# Patient Record
Sex: Female | Born: 1935 | Race: White | Hispanic: No | Smoking: Never smoker
Health system: Southern US, Community
[De-identification: ages and names within clinical notes are randomized; demographics above are authoritative.]

## PROBLEM LIST (undated history)

## (undated) DIAGNOSIS — Z79899 Other long term (current) drug therapy: Secondary | ICD-10-CM

## (undated) DIAGNOSIS — M79609 Pain in unspecified limb: Secondary | ICD-10-CM

## (undated) DIAGNOSIS — E785 Hyperlipidemia, unspecified: Secondary | ICD-10-CM

## (undated) DIAGNOSIS — K219 Gastro-esophageal reflux disease without esophagitis: Secondary | ICD-10-CM

## (undated) DIAGNOSIS — I5189 Other ill-defined heart diseases: Secondary | ICD-10-CM

## (undated) DIAGNOSIS — F0390 Unspecified dementia without behavioral disturbance: Secondary | ICD-10-CM

## (undated) DIAGNOSIS — E038 Other specified hypothyroidism: Secondary | ICD-10-CM

## (undated) DIAGNOSIS — Z7901 Long term (current) use of anticoagulants: Secondary | ICD-10-CM

## (undated) DIAGNOSIS — Z6828 Body mass index (BMI) 28.0-28.9, adult: Secondary | ICD-10-CM

## (undated) DIAGNOSIS — R413 Other amnesia: Secondary | ICD-10-CM

## (undated) DIAGNOSIS — I495 Sick sinus syndrome: Secondary | ICD-10-CM

## (undated) DIAGNOSIS — J301 Allergic rhinitis due to pollen: Secondary | ICD-10-CM

## (undated) DIAGNOSIS — Z8489 Family history of other specified conditions: Secondary | ICD-10-CM

## (undated) DIAGNOSIS — E538 Deficiency of other specified B group vitamins: Secondary | ICD-10-CM

## (undated) DIAGNOSIS — I4819 Other persistent atrial fibrillation: Secondary | ICD-10-CM

## (undated) DIAGNOSIS — I10 Essential (primary) hypertension: Secondary | ICD-10-CM

## (undated) DIAGNOSIS — R42 Dizziness and giddiness: Secondary | ICD-10-CM

## (undated) HISTORY — DX: Other specified hypothyroidism: E03.8

## (undated) HISTORY — DX: Gastro-esophageal reflux disease without esophagitis: K21.9

## (undated) HISTORY — DX: Unspecified dementia, unspecified severity, without behavioral disturbance, psychotic disturbance, mood disturbance, and anxiety: F03.90

## (undated) HISTORY — DX: Other ill-defined heart diseases: I51.89

## (undated) HISTORY — DX: Other long term (current) drug therapy: Z79.899

## (undated) HISTORY — PX: TONSILLECTOMY: SUR1361

## (undated) HISTORY — DX: Dizziness and giddiness: R42

## (undated) HISTORY — DX: Hyperlipidemia, unspecified: E78.5

## (undated) HISTORY — DX: Sick sinus syndrome: I49.5

## (undated) HISTORY — DX: Allergic rhinitis due to pollen: J30.1

## (undated) HISTORY — DX: Other persistent atrial fibrillation: I48.19

## (undated) HISTORY — DX: Other amnesia: R41.3

## (undated) HISTORY — DX: Deficiency of other specified B group vitamins: E53.8

## (undated) HISTORY — PX: APPENDECTOMY: SHX54

## (undated) HISTORY — DX: Pain in unspecified limb: M79.609

## (undated) HISTORY — PX: OTHER SURGICAL HISTORY: SHX169

## (undated) HISTORY — PX: PACEMAKER INSERTION: SHX728

## (undated) HISTORY — DX: Body mass index (BMI) 28.0-28.9, adult: Z68.28

## (undated) HISTORY — DX: Family history of other specified conditions: Z84.89

## (undated) HISTORY — DX: Essential (primary) hypertension: I10

---

## 1999-12-17 ENCOUNTER — Ambulatory Visit (HOSPITAL_COMMUNITY): Admission: RE | Admit: 1999-12-17 | Discharge: 1999-12-17 | Payer: Self-pay | Admitting: Otolaryngology

## 1999-12-17 ENCOUNTER — Encounter: Payer: Self-pay | Admitting: Otolaryngology

## 2000-01-05 ENCOUNTER — Ambulatory Visit (HOSPITAL_COMMUNITY): Admission: RE | Admit: 2000-01-05 | Discharge: 2000-01-05 | Payer: Self-pay | Admitting: Specialist

## 2000-01-05 ENCOUNTER — Encounter: Payer: Self-pay | Admitting: Specialist

## 2002-03-02 ENCOUNTER — Encounter: Payer: Self-pay | Admitting: Internal Medicine

## 2002-03-02 ENCOUNTER — Encounter: Admission: RE | Admit: 2002-03-02 | Discharge: 2002-03-02 | Payer: Self-pay | Admitting: Internal Medicine

## 2003-05-30 ENCOUNTER — Ambulatory Visit (HOSPITAL_COMMUNITY): Admission: RE | Admit: 2003-05-30 | Discharge: 2003-05-30 | Payer: Self-pay | Admitting: Cardiology

## 2003-10-01 ENCOUNTER — Ambulatory Visit (HOSPITAL_COMMUNITY): Admission: RE | Admit: 2003-10-01 | Discharge: 2003-10-01 | Payer: Self-pay | Admitting: Family Medicine

## 2004-04-11 ENCOUNTER — Ambulatory Visit: Payer: Self-pay | Admitting: Cardiology

## 2004-10-02 ENCOUNTER — Ambulatory Visit: Payer: Self-pay | Admitting: Gastroenterology

## 2004-10-06 ENCOUNTER — Ambulatory Visit (HOSPITAL_COMMUNITY): Admission: RE | Admit: 2004-10-06 | Discharge: 2004-10-06 | Payer: Self-pay | Admitting: Gastroenterology

## 2004-10-06 ENCOUNTER — Ambulatory Visit: Payer: Self-pay | Admitting: Gastroenterology

## 2005-05-26 ENCOUNTER — Ambulatory Visit: Payer: Self-pay | Admitting: Cardiology

## 2005-07-27 ENCOUNTER — Ambulatory Visit: Payer: Self-pay | Admitting: Cardiology

## 2005-08-05 ENCOUNTER — Ambulatory Visit: Payer: Self-pay

## 2005-08-31 ENCOUNTER — Ambulatory Visit: Payer: Self-pay | Admitting: Gastroenterology

## 2005-09-28 ENCOUNTER — Ambulatory Visit: Payer: Self-pay | Admitting: Gastroenterology

## 2005-11-23 ENCOUNTER — Ambulatory Visit: Payer: Self-pay | Admitting: Cardiology

## 2006-01-01 ENCOUNTER — Ambulatory Visit: Payer: Self-pay | Admitting: Cardiology

## 2006-03-02 ENCOUNTER — Encounter: Admission: RE | Admit: 2006-03-02 | Discharge: 2006-03-02 | Payer: Self-pay | Admitting: Family Medicine

## 2006-05-20 ENCOUNTER — Ambulatory Visit: Payer: Self-pay | Admitting: Cardiology

## 2006-05-20 LAB — CONVERTED CEMR LAB
BUN: 8 mg/dL (ref 6–23)
Basophils Absolute: 0 10*3/uL (ref 0.0–0.1)
Basophils Relative: 0.2 % (ref 0.0–1.0)
CO2: 26 meq/L (ref 19–32)
Creatinine, Ser: 0.8 mg/dL (ref 0.4–1.2)
Eosinophils Absolute: 0.2 10*3/uL (ref 0.0–0.6)
Eosinophils Relative: 3.3 % (ref 0.0–5.0)
Glucose, Bld: 110 mg/dL — ABNORMAL HIGH (ref 70–99)
HCT: 40.8 % (ref 36.0–46.0)
Hemoglobin: 14.2 g/dL (ref 12.0–15.0)
Lymphocytes Relative: 36.7 % (ref 12.0–46.0)
MCV: 87.5 fL (ref 78.0–100.0)
Monocytes Absolute: 0.4 10*3/uL (ref 0.2–0.7)
Monocytes Relative: 7.4 % (ref 3.0–11.0)
Neutro Abs: 2.9 10*3/uL (ref 1.4–7.7)
Neutrophils Relative %: 52.4 % (ref 43.0–77.0)
RBC: 4.66 M/uL (ref 3.87–5.11)
Sodium: 145 meq/L (ref 135–145)
WBC: 5.6 10*3/uL (ref 4.5–10.5)

## 2006-05-24 ENCOUNTER — Ambulatory Visit: Payer: Self-pay | Admitting: Cardiology

## 2006-06-01 ENCOUNTER — Ambulatory Visit: Payer: Self-pay | Admitting: Cardiology

## 2006-06-03 ENCOUNTER — Ambulatory Visit: Payer: Self-pay | Admitting: Cardiology

## 2006-06-03 LAB — CONVERTED CEMR LAB
Basophils Relative: 0.3 % (ref 0.0–1.0)
Eosinophils Absolute: 0.2 10*3/uL (ref 0.0–0.6)
Eosinophils Relative: 3.8 % (ref 0.0–5.0)
INR: 1.5 (ref 0.9–2.0)
MCHC: 34.1 g/dL (ref 30.0–36.0)
Monocytes Relative: 7.2 % (ref 3.0–11.0)
Potassium: 4 meq/L (ref 3.5–5.1)
Prothrombin Time: 15.3 s — ABNORMAL HIGH (ref 10.0–14.0)
RBC: 4.65 M/uL (ref 3.87–5.11)
Sodium: 145 meq/L (ref 135–145)
aPTT: 86.8 s — ABNORMAL HIGH (ref 26.5–36.5)

## 2006-06-11 ENCOUNTER — Observation Stay (HOSPITAL_COMMUNITY): Admission: EM | Admit: 2006-06-11 | Discharge: 2006-06-12 | Payer: Self-pay | Admitting: Emergency Medicine

## 2006-06-11 ENCOUNTER — Ambulatory Visit: Payer: Self-pay | Admitting: Cardiovascular Disease

## 2006-06-23 ENCOUNTER — Ambulatory Visit: Payer: Self-pay

## 2006-06-29 ENCOUNTER — Ambulatory Visit: Payer: Self-pay | Admitting: Cardiology

## 2006-09-02 ENCOUNTER — Ambulatory Visit: Payer: Self-pay | Admitting: Cardiology

## 2006-12-07 ENCOUNTER — Ambulatory Visit: Payer: Self-pay | Admitting: Cardiology

## 2006-12-21 ENCOUNTER — Ambulatory Visit: Payer: Self-pay | Admitting: Cardiology

## 2007-01-19 ENCOUNTER — Ambulatory Visit: Payer: Self-pay | Admitting: Internal Medicine

## 2007-01-19 ENCOUNTER — Ambulatory Visit: Payer: Self-pay | Admitting: Cardiovascular Disease

## 2007-01-25 ENCOUNTER — Ambulatory Visit: Payer: Self-pay | Admitting: Internal Medicine

## 2007-01-31 ENCOUNTER — Ambulatory Visit: Payer: Self-pay | Admitting: Cardiology

## 2007-04-15 ENCOUNTER — Ambulatory Visit: Payer: Self-pay

## 2007-04-20 ENCOUNTER — Ambulatory Visit: Payer: Self-pay | Admitting: Cardiology

## 2007-06-23 ENCOUNTER — Encounter: Admission: RE | Admit: 2007-06-23 | Discharge: 2007-06-23 | Payer: Self-pay | Admitting: Family Medicine

## 2007-06-23 ENCOUNTER — Ambulatory Visit: Payer: Self-pay | Admitting: Cardiology

## 2007-06-30 ENCOUNTER — Encounter: Admission: RE | Admit: 2007-06-30 | Discharge: 2007-06-30 | Payer: Self-pay | Admitting: Family Medicine

## 2007-07-18 ENCOUNTER — Encounter: Admission: RE | Admit: 2007-07-18 | Discharge: 2007-07-18 | Payer: Self-pay | Admitting: Family Medicine

## 2007-07-18 ENCOUNTER — Encounter (INDEPENDENT_AMBULATORY_CARE_PROVIDER_SITE_OTHER): Payer: Self-pay | Admitting: Diagnostic Radiology

## 2007-09-21 ENCOUNTER — Ambulatory Visit: Payer: Self-pay | Admitting: Cardiology

## 2007-10-11 ENCOUNTER — Ambulatory Visit: Payer: Self-pay | Admitting: Cardiology

## 2008-01-13 ENCOUNTER — Ambulatory Visit: Payer: Self-pay | Admitting: Internal Medicine

## 2008-01-29 DIAGNOSIS — I4891 Unspecified atrial fibrillation: Secondary | ICD-10-CM

## 2008-01-29 DIAGNOSIS — I495 Sick sinus syndrome: Secondary | ICD-10-CM

## 2008-01-31 ENCOUNTER — Ambulatory Visit: Payer: Self-pay | Admitting: Cardiology

## 2008-02-08 ENCOUNTER — Ambulatory Visit: Payer: Self-pay | Admitting: Cardiology

## 2008-02-08 LAB — CONVERTED CEMR LAB
CO2: 29 meq/L (ref 19–32)
Calcium: 10.6 mg/dL — ABNORMAL HIGH (ref 8.4–10.5)
GFR calc Af Amer: 77 mL/min
GFR calc non Af Amer: 63 mL/min
Glucose, Bld: 116 mg/dL — ABNORMAL HIGH (ref 70–99)
Sodium: 139 meq/L (ref 135–145)

## 2008-02-11 IMAGING — CT CT HEAD W/O CM
1 series · 16 of 30 positions shown, 20 images · IV contrast (agent unspecified)
Comparison: None

CLINICAL DATA: Syncope, blurred vision

HEAD CT WITHOUT CONTRAST:
TECHNIQUE: 5mm collimated images were obtained from the base of the skull
through the vertex according to standard protocol without contrast.

[Series 2: head routine 4.8 h47s · axial · 0.45mm/px · z∈[-132,+7]mm · 16 of 30 slices shown, 20 images]
[im 2/30  brain]
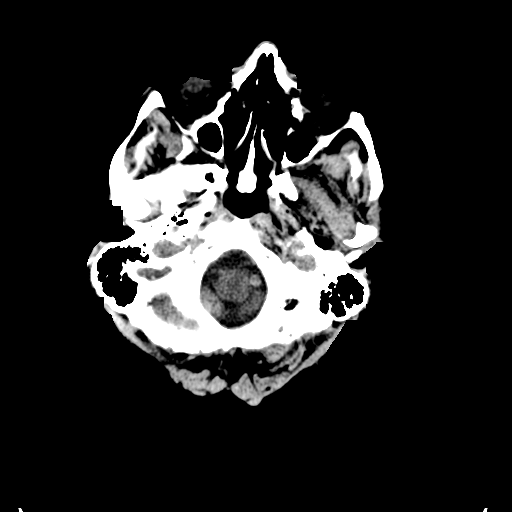
[im 2/30  bone]
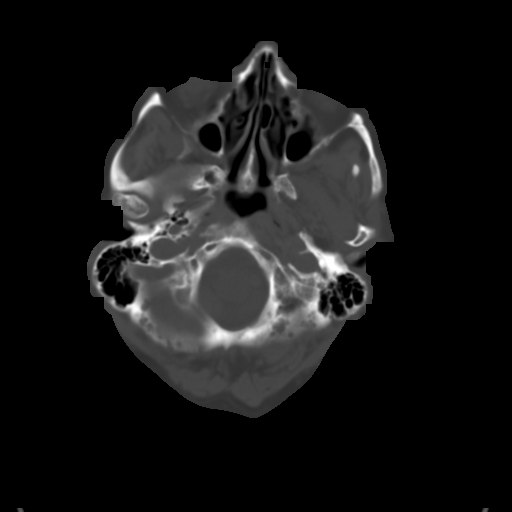
[im 4/30  brain]
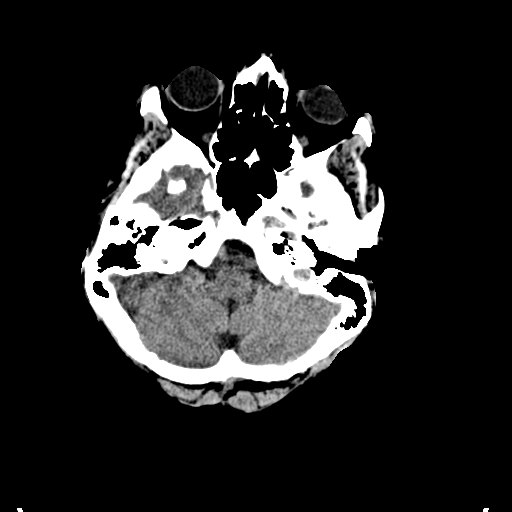
[im 6/30  brain]
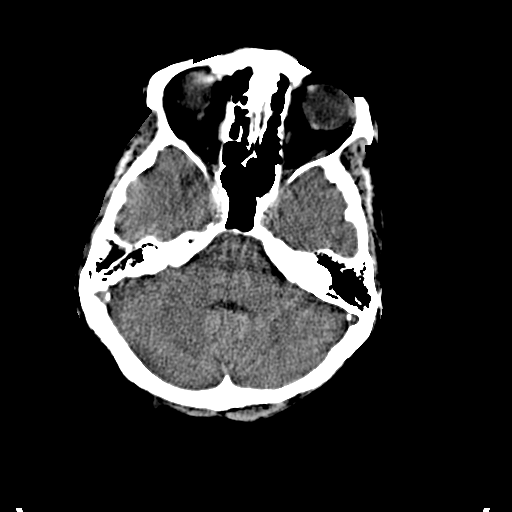
[im 8/30  brain]
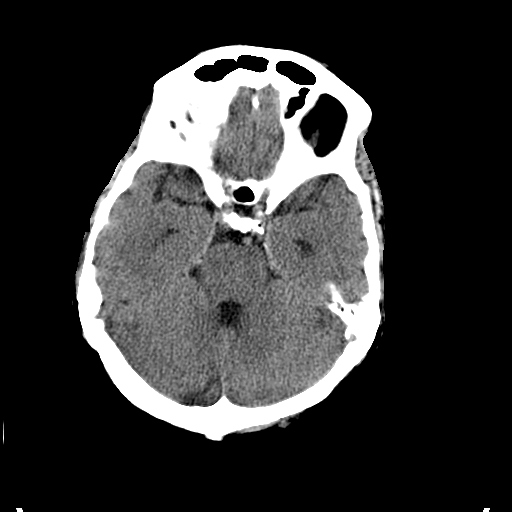
[im 9/30  brain]
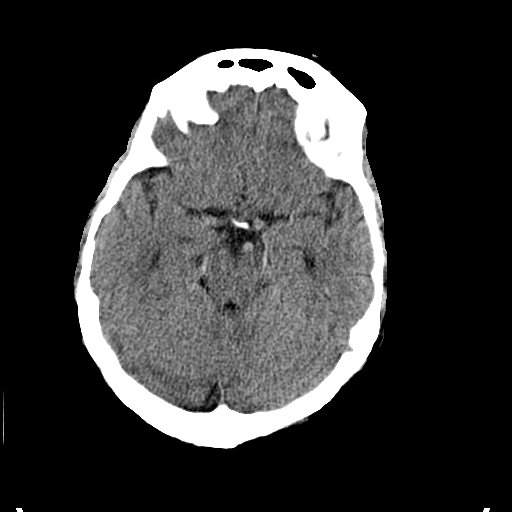
[im 9/30  bone]
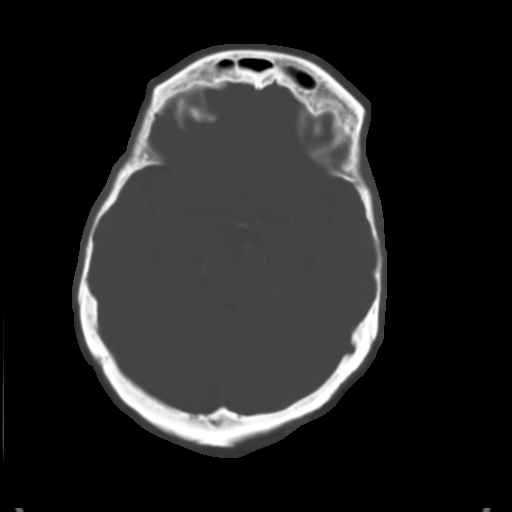
[im 11/30  brain]
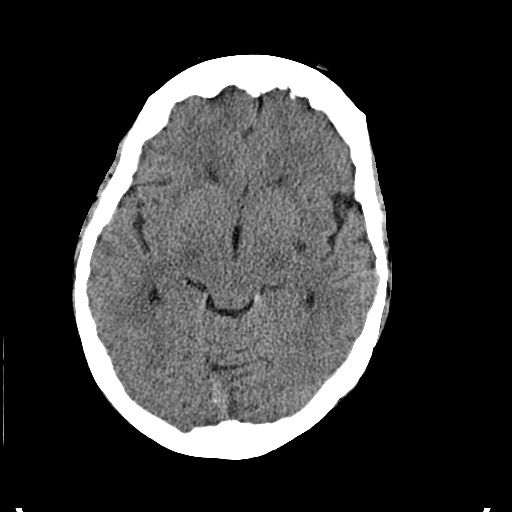
[im 13/30  brain]
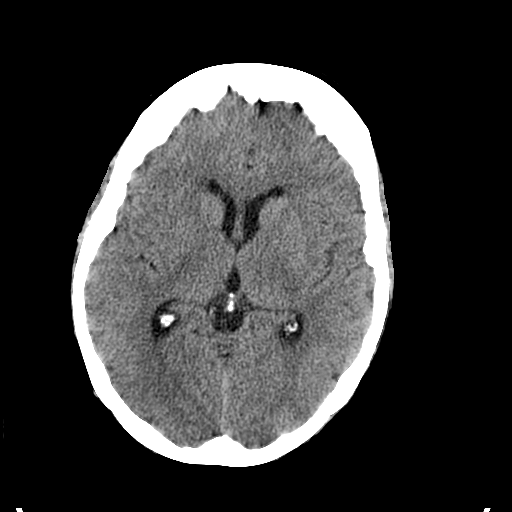
[im 15/30  brain]
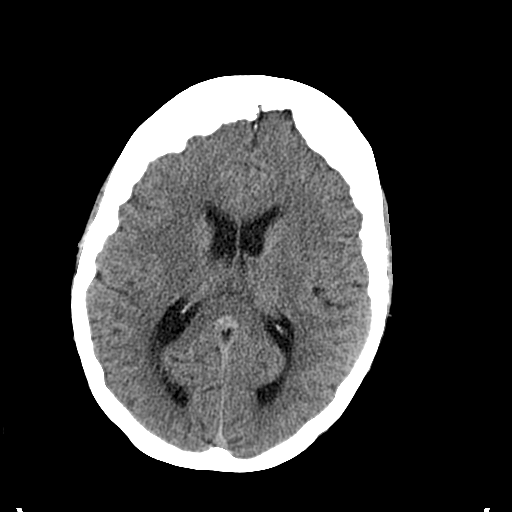
[im 16/30  brain]
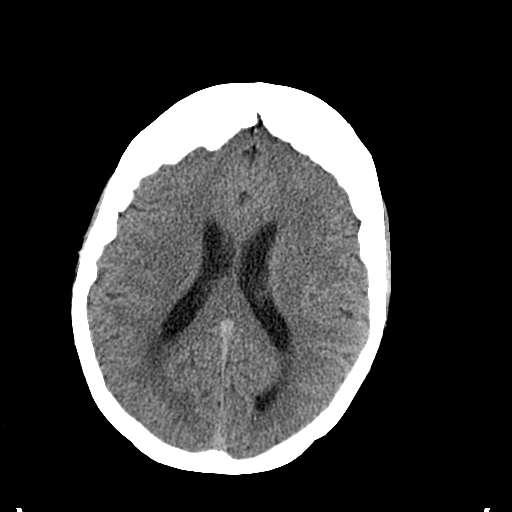
[im 16/30  bone]
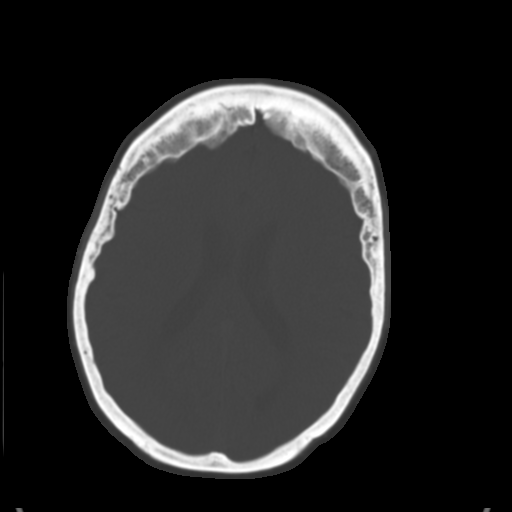
[im 18/30  brain]
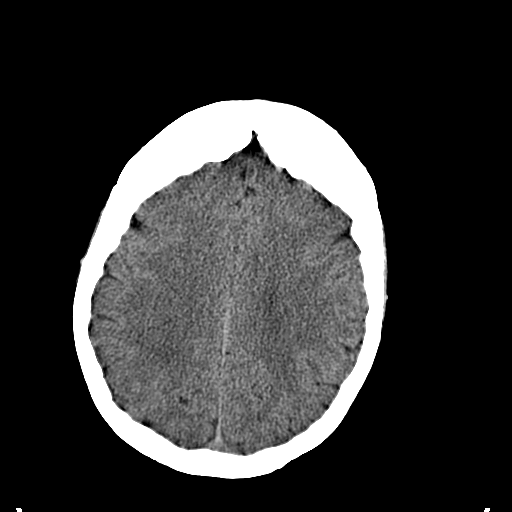
[im 20/30  brain]
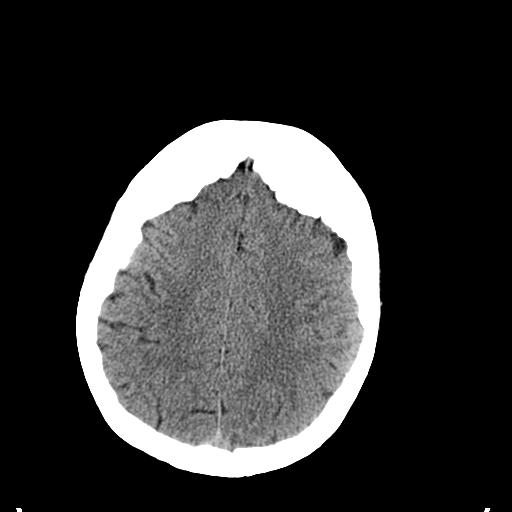
[im 22/30  brain]
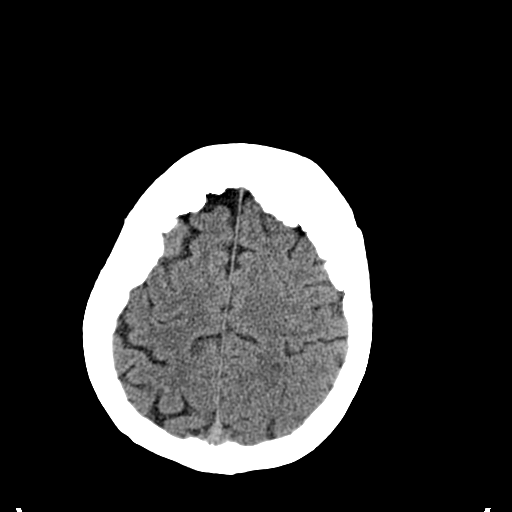
[im 23/30  brain]
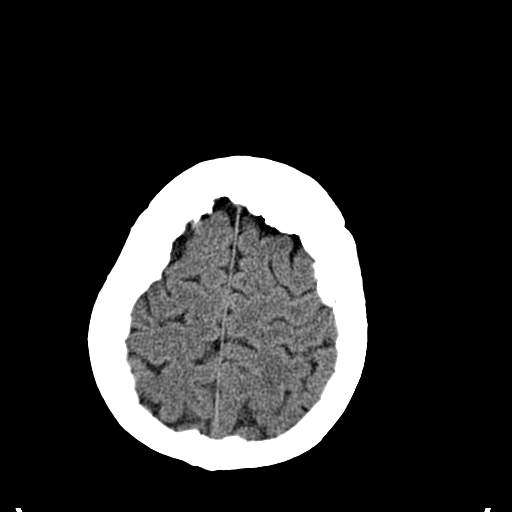
[im 23/30  bone]
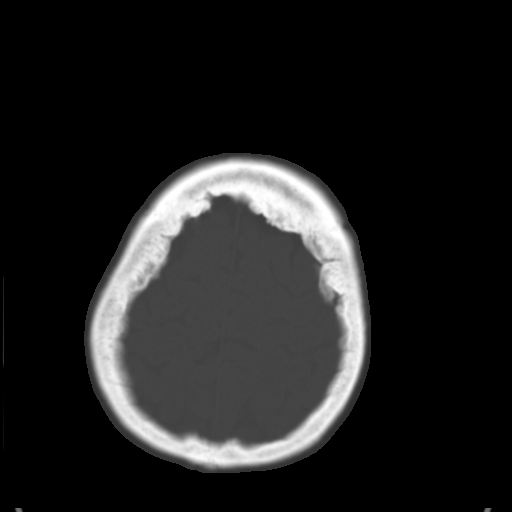
[im 25/30  brain]
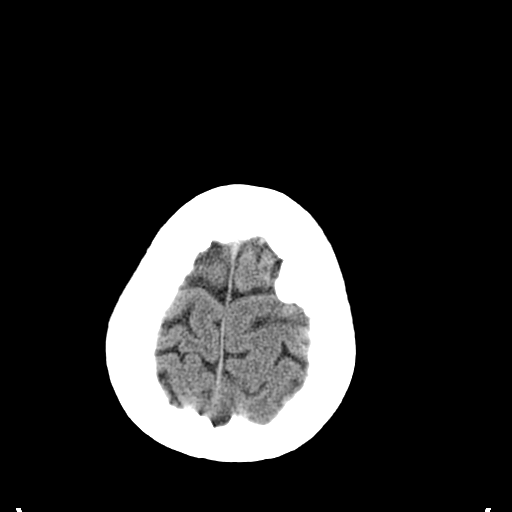
[im 27/30  brain]
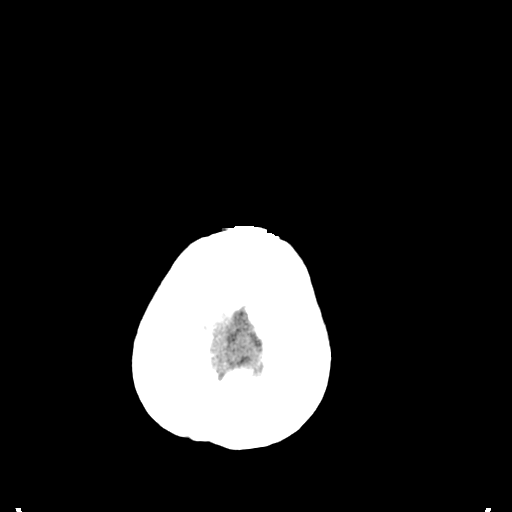
[im 29/30  brain]
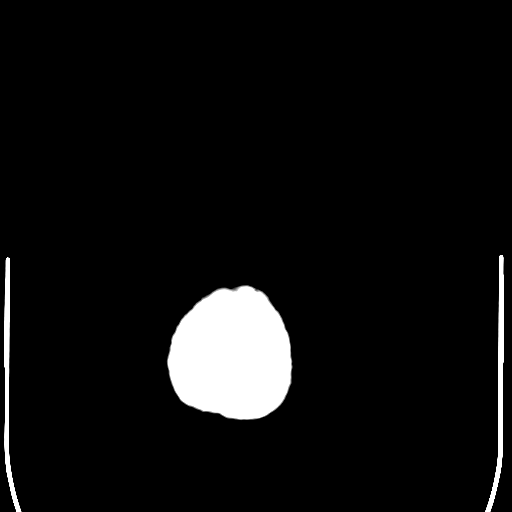

[16 of 30 positions shown; findings below may reference images not displayed]

FINDINGS: There is no evidence of intracranial hemorrhage, hydrocephalus, mass
lesion, or acute infarction.  No abnormal extra-axial fluid collections
identified.  No skull abnormalities are noted.
IMPRESSION: No acute intracranial abnormality.

## 2008-02-12 IMAGING — CR DG CHEST 2V
2 series · 2 of 2 positions shown · non-contrast
Comparison: 10/01/2003

CLINICAL DATA: Chest pain

CHEST - 2 VIEW:

[w chest pa]
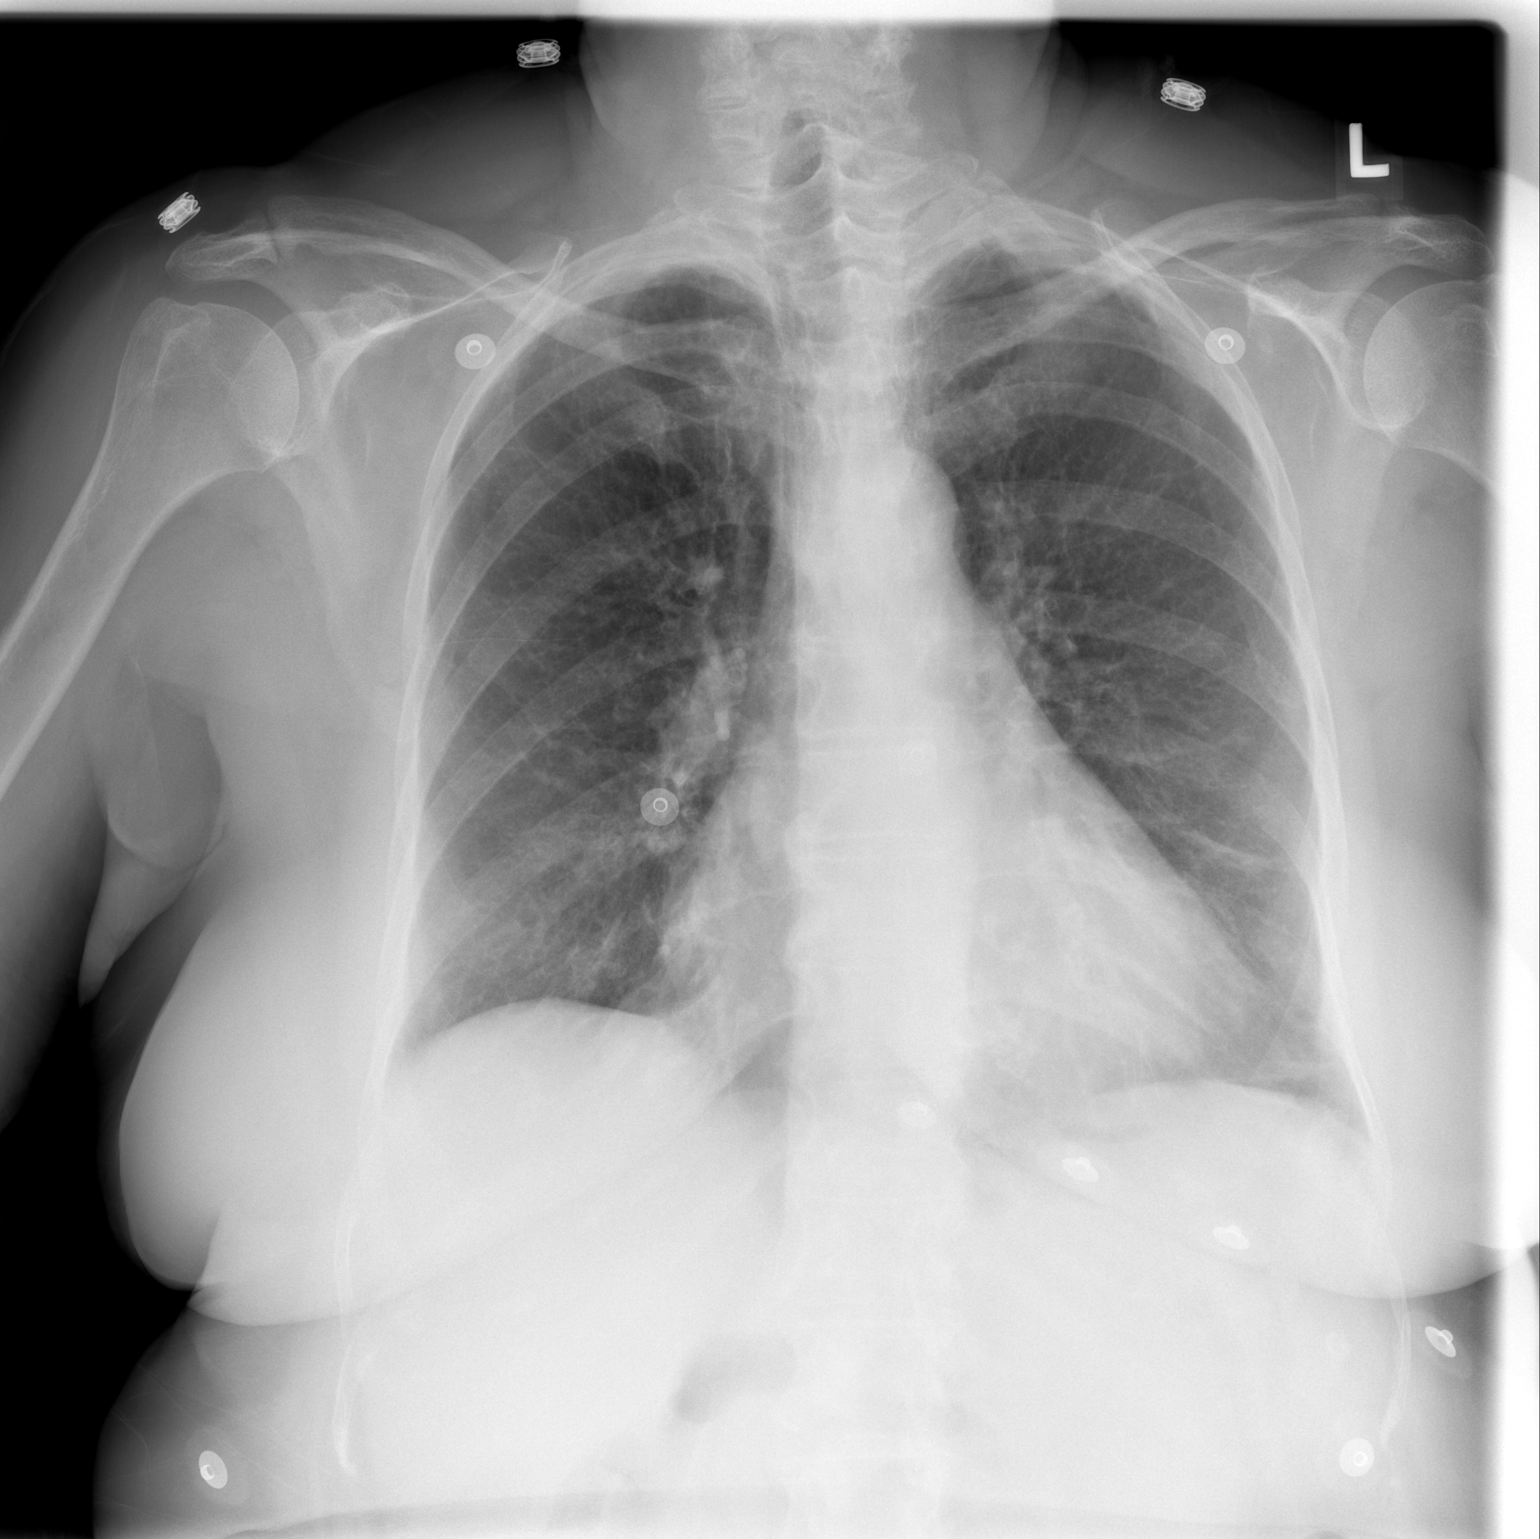

[w chest lat]
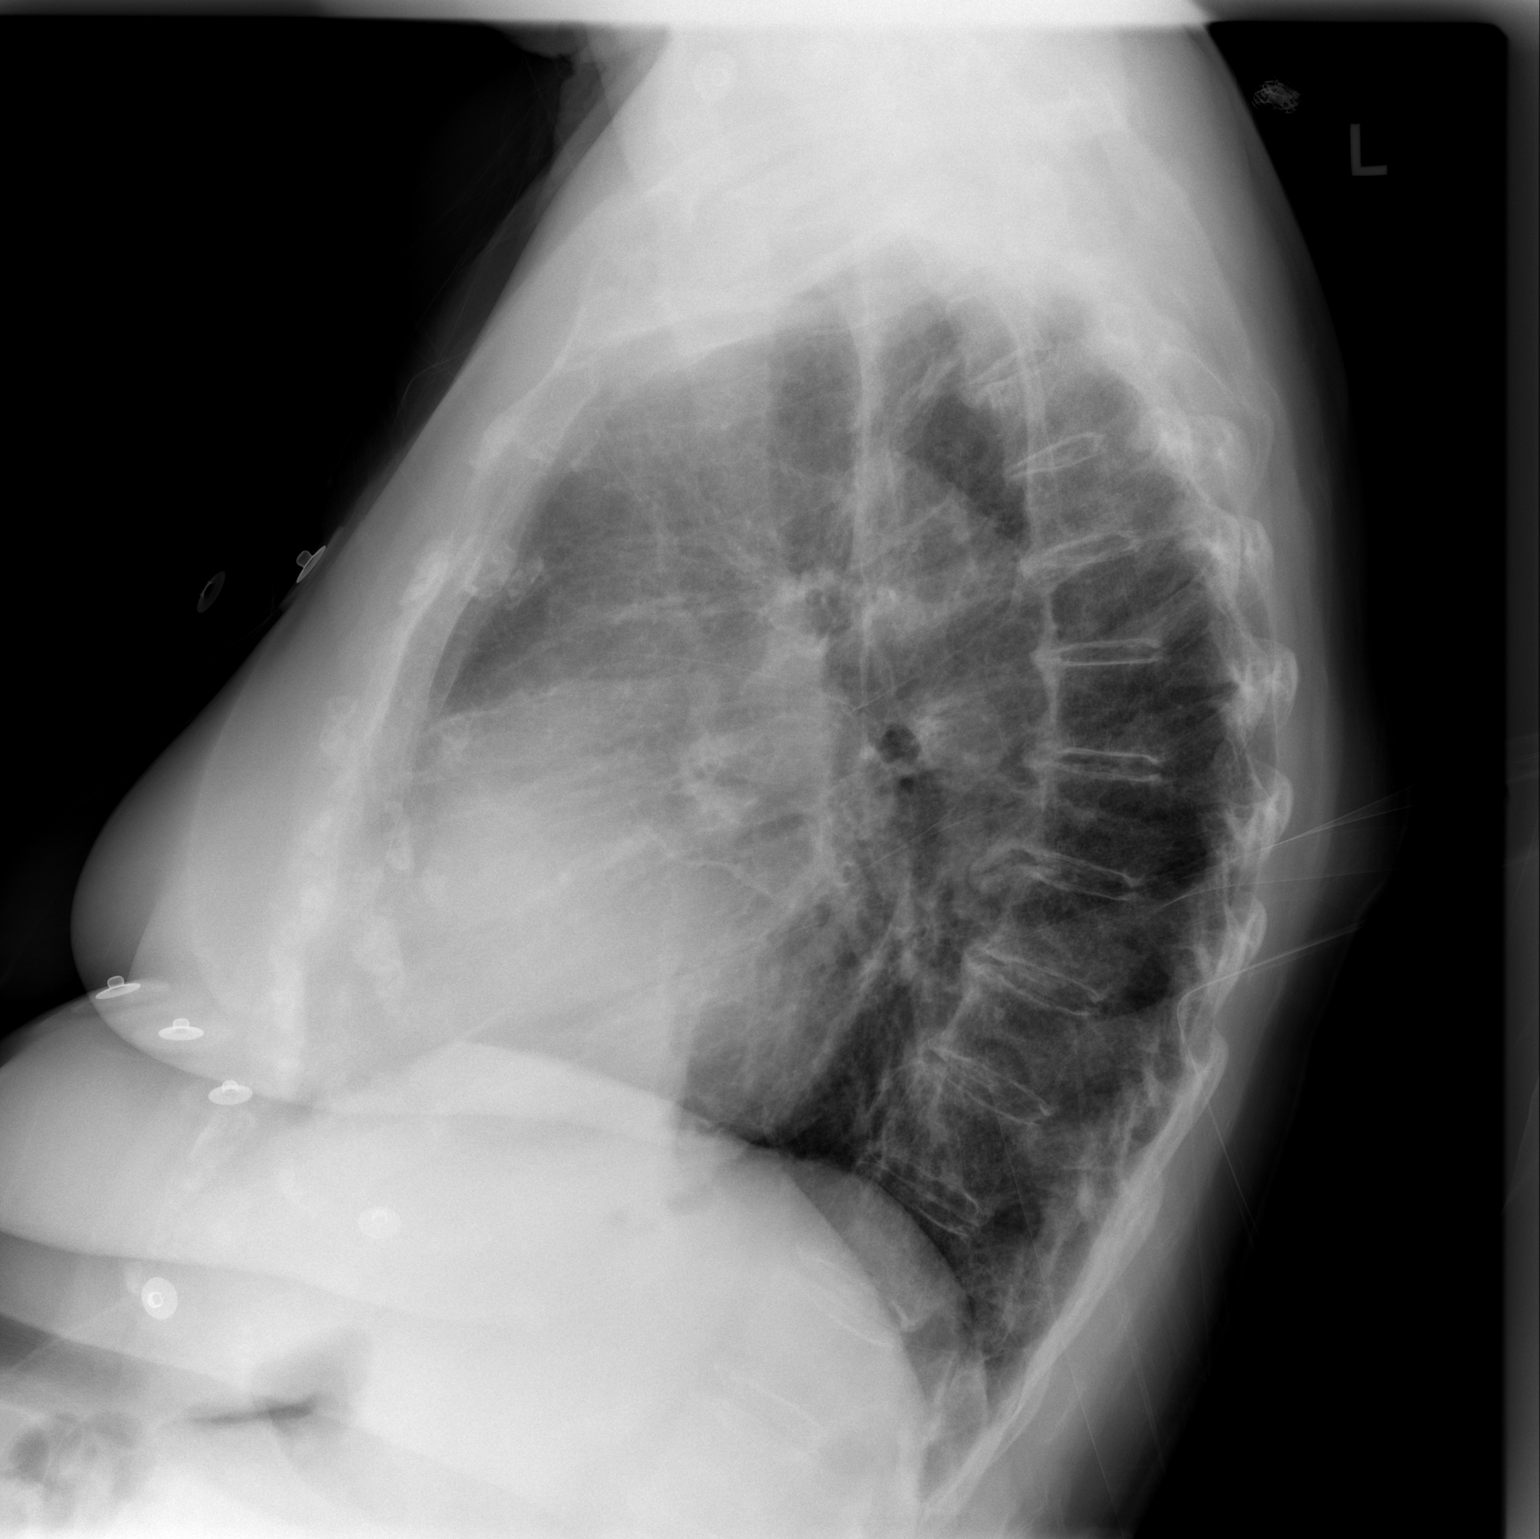

[2 of 2 positions shown; findings below may reference images not displayed]

FINDINGS: There is mild cardiomegaly and hyperinflation. Lingular scarring
noted. No focal opacity on the right. No effusions. Degenerative changes in the
thoracic spine.
IMPRESSION: Cardiomegaly and hyperinflation. Lingular scar. No acute findings.

## 2008-02-13 IMAGING — CR DG CHEST 2V
2 series · 2 of 2 positions shown · non-contrast
Comparison: 06/11/2006

CLINICAL DATA: Bradycardia. Postop pacer. 
 CHEST - 2 VIEW:

[w chest pa]
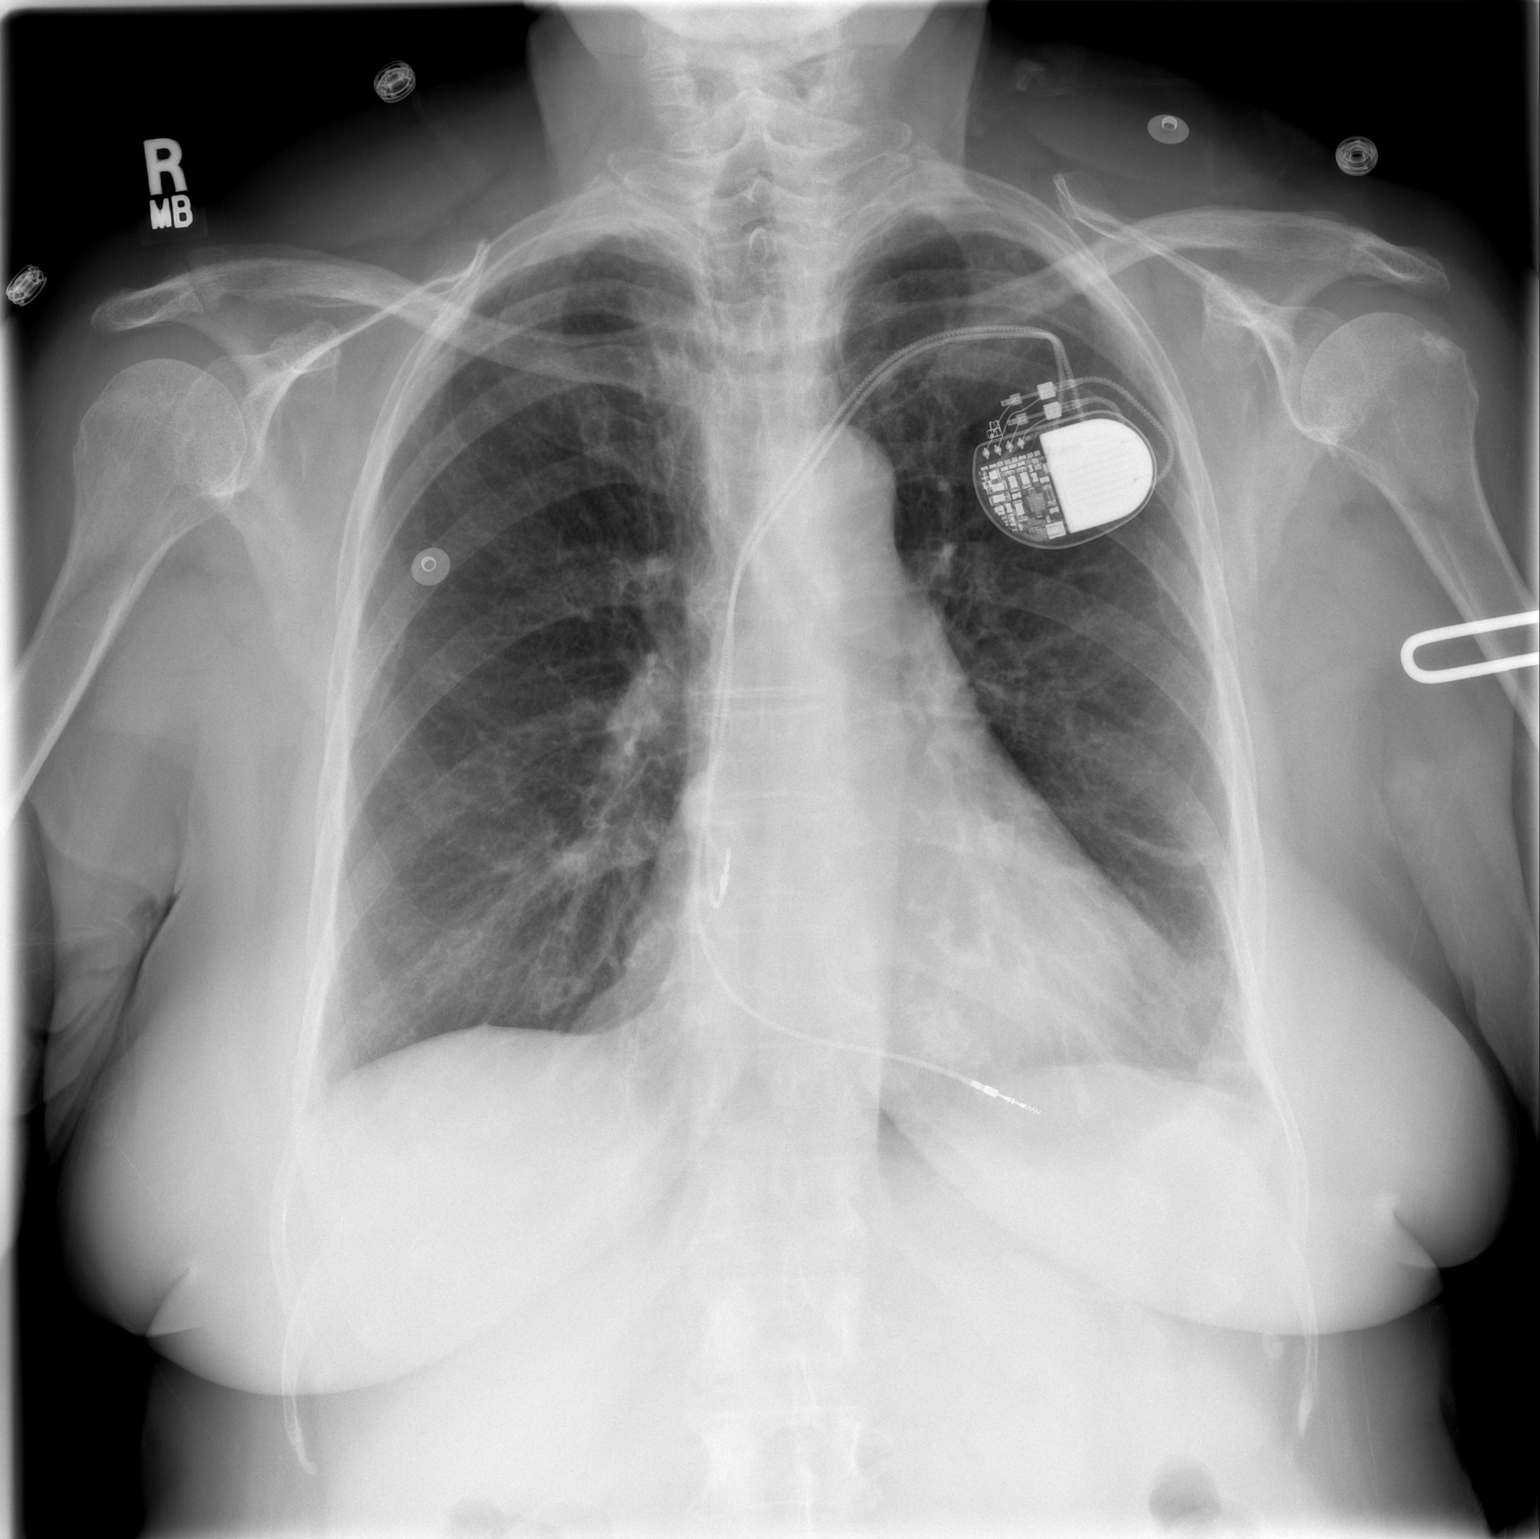

[w chest lat]
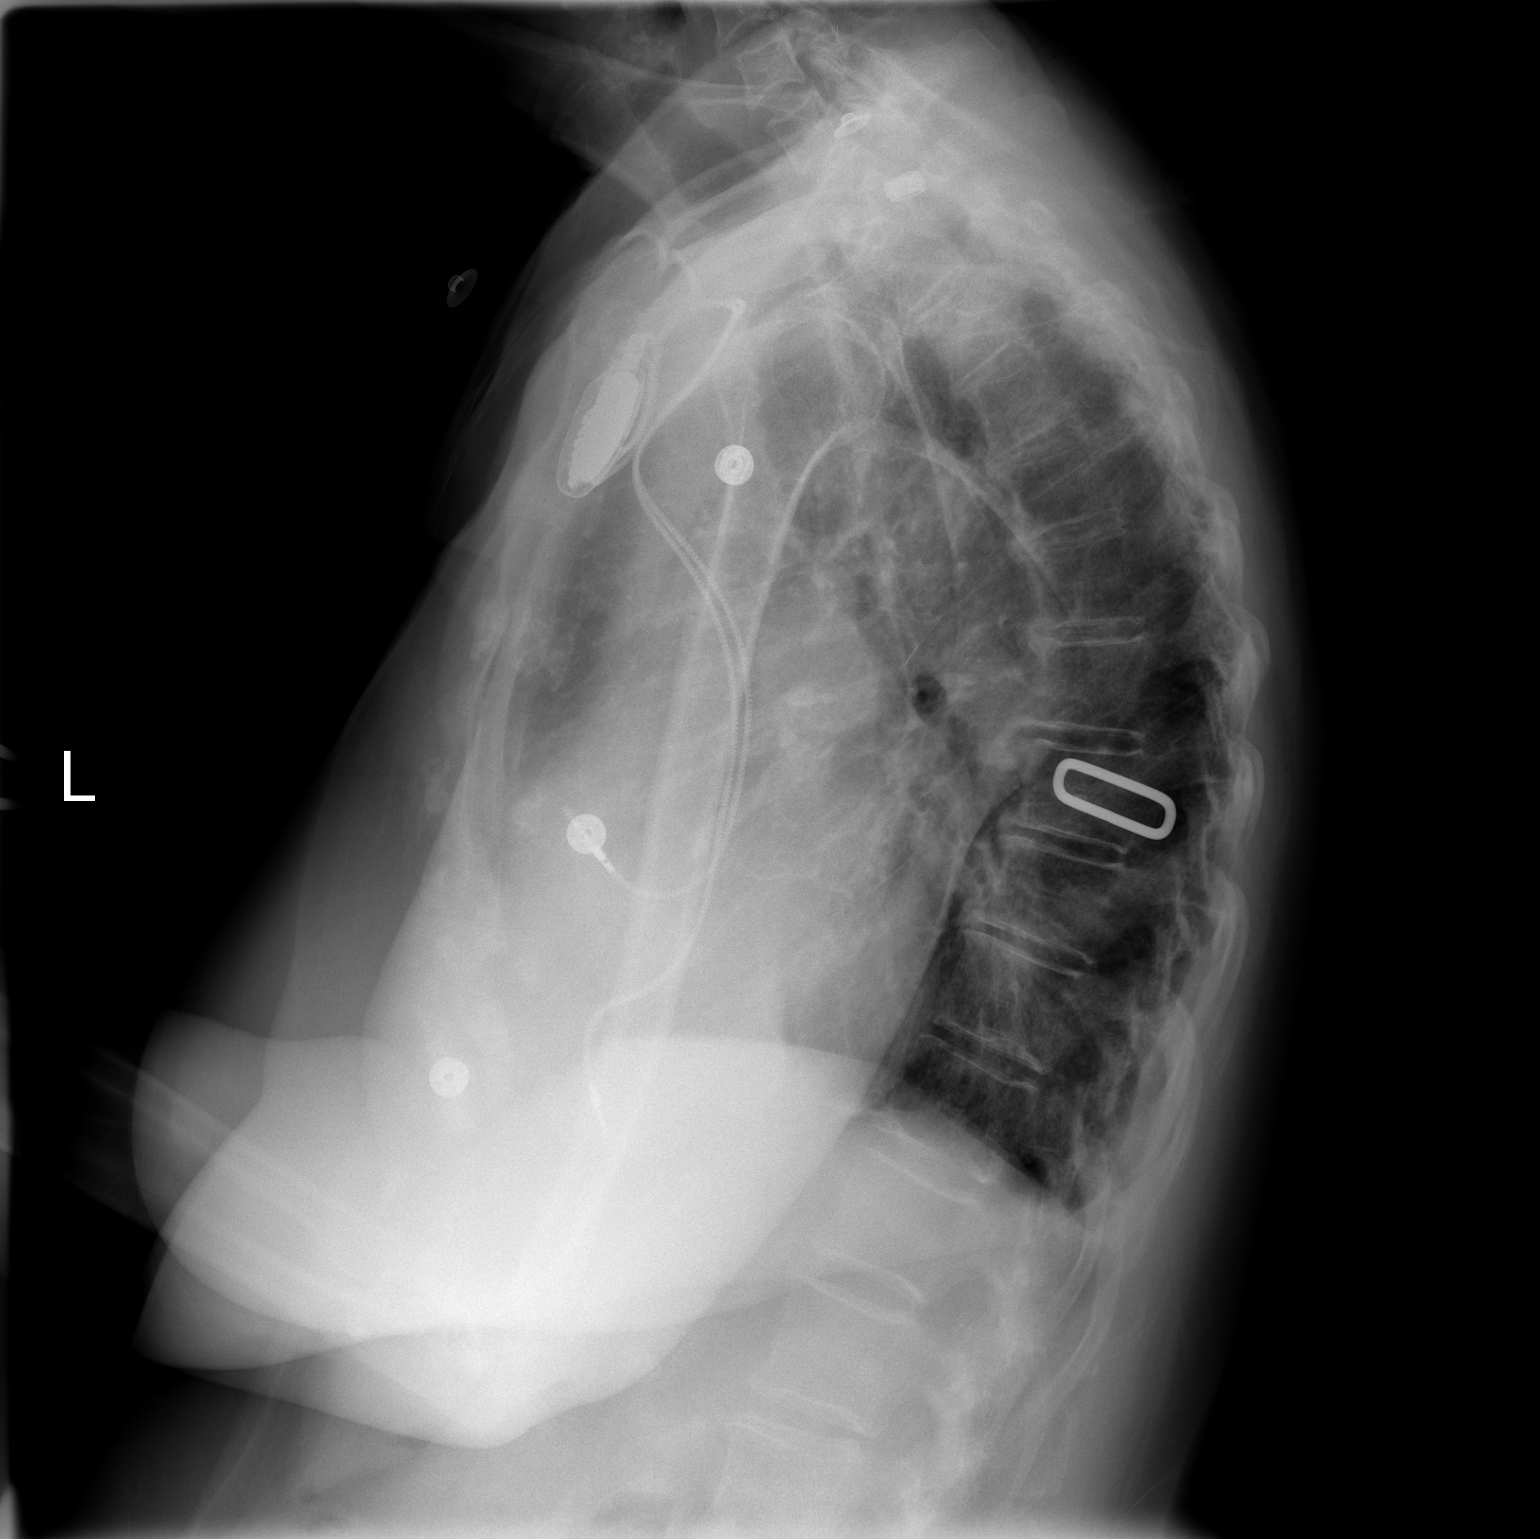

[2 of 2 positions shown; findings below may reference images not displayed]

FINDINGS: Two views of the chest show a pacer now present with atrial and ventricular leads. No pneumothorax is seen. Linear scarring at the left base remains.
IMPRESSION: Permanent pacer present. No active lung disease.

## 2008-03-07 ENCOUNTER — Ambulatory Visit: Payer: Self-pay | Admitting: Internal Medicine

## 2008-03-07 ENCOUNTER — Ambulatory Visit: Payer: Self-pay | Admitting: Cardiology

## 2008-03-21 ENCOUNTER — Ambulatory Visit: Payer: Self-pay | Admitting: Internal Medicine

## 2008-03-21 ENCOUNTER — Ambulatory Visit: Payer: Self-pay | Admitting: Cardiology

## 2008-03-28 ENCOUNTER — Ambulatory Visit: Payer: Self-pay | Admitting: Internal Medicine

## 2008-04-04 ENCOUNTER — Ambulatory Visit: Payer: Self-pay | Admitting: Internal Medicine

## 2008-04-18 ENCOUNTER — Ambulatory Visit: Payer: Self-pay | Admitting: Internal Medicine

## 2008-04-24 ENCOUNTER — Encounter: Payer: Self-pay | Admitting: Cardiology

## 2008-05-01 ENCOUNTER — Ambulatory Visit: Payer: Self-pay | Admitting: Cardiology

## 2008-05-01 ENCOUNTER — Ambulatory Visit: Payer: Self-pay | Admitting: Internal Medicine

## 2008-05-01 ENCOUNTER — Encounter: Payer: Self-pay | Admitting: Cardiology

## 2008-05-01 DIAGNOSIS — R609 Edema, unspecified: Secondary | ICD-10-CM | POA: Insufficient documentation

## 2008-05-01 LAB — CONVERTED CEMR LAB
BUN: 11 mg/dL (ref 6–23)
CO2: 25 meq/L (ref 19–32)
Calcium: 10 mg/dL (ref 8.4–10.5)
Chloride: 110 meq/L (ref 96–112)
GFR calc non Af Amer: 84.51 mL/min (ref >60)
Potassium: 3.8 meq/L (ref 3.5–5.1)
Sodium: 143 meq/L (ref 135–145)

## 2008-05-14 ENCOUNTER — Ambulatory Visit: Payer: Self-pay | Admitting: Internal Medicine

## 2008-05-14 ENCOUNTER — Ambulatory Visit: Payer: Self-pay | Admitting: Cardiology

## 2008-06-18 ENCOUNTER — Ambulatory Visit: Payer: Self-pay | Admitting: Internal Medicine

## 2008-07-02 ENCOUNTER — Ambulatory Visit: Payer: Self-pay | Admitting: Internal Medicine

## 2008-07-16 ENCOUNTER — Ambulatory Visit: Payer: Self-pay | Admitting: Internal Medicine

## 2008-07-30 ENCOUNTER — Ambulatory Visit: Payer: Self-pay | Admitting: Internal Medicine

## 2008-08-06 ENCOUNTER — Ambulatory Visit: Payer: Self-pay | Admitting: Internal Medicine

## 2008-08-20 ENCOUNTER — Ambulatory Visit: Payer: Self-pay | Admitting: Internal Medicine

## 2008-09-19 ENCOUNTER — Ambulatory Visit: Payer: Self-pay | Admitting: Internal Medicine

## 2008-10-16 ENCOUNTER — Ambulatory Visit: Payer: Self-pay | Admitting: Cardiology

## 2008-10-16 ENCOUNTER — Ambulatory Visit: Payer: Self-pay | Admitting: Internal Medicine

## 2008-10-16 DIAGNOSIS — R071 Chest pain on breathing: Secondary | ICD-10-CM

## 2008-10-16 DIAGNOSIS — I10 Essential (primary) hypertension: Secondary | ICD-10-CM

## 2008-10-19 ENCOUNTER — Ambulatory Visit: Payer: Self-pay | Admitting: Internal Medicine

## 2008-10-19 LAB — CONVERTED CEMR LAB: POC INR: 1.2

## 2008-10-24 ENCOUNTER — Ambulatory Visit: Payer: Self-pay | Admitting: Internal Medicine

## 2008-10-24 LAB — CONVERTED CEMR LAB: POC INR: 2.3

## 2008-10-31 ENCOUNTER — Ambulatory Visit: Payer: Self-pay | Admitting: Internal Medicine

## 2008-11-02 ENCOUNTER — Telehealth: Payer: Self-pay | Admitting: Cardiology

## 2008-11-12 ENCOUNTER — Telehealth: Payer: Self-pay | Admitting: Cardiology

## 2008-11-12 ENCOUNTER — Ambulatory Visit: Payer: Self-pay | Admitting: Internal Medicine

## 2008-12-05 ENCOUNTER — Encounter: Payer: Self-pay | Admitting: Cardiology

## 2008-12-06 ENCOUNTER — Ambulatory Visit: Payer: Self-pay | Admitting: Cardiology

## 2008-12-06 LAB — CONVERTED CEMR LAB: POC INR: 3.4

## 2008-12-20 ENCOUNTER — Ambulatory Visit: Payer: Self-pay | Admitting: Internal Medicine

## 2008-12-20 ENCOUNTER — Encounter: Payer: Self-pay | Admitting: Cardiology

## 2008-12-24 ENCOUNTER — Ambulatory Visit: Payer: Self-pay | Admitting: Internal Medicine

## 2008-12-24 ENCOUNTER — Encounter: Payer: Self-pay | Admitting: Cardiology

## 2008-12-28 ENCOUNTER — Emergency Department (HOSPITAL_COMMUNITY): Admission: EM | Admit: 2008-12-28 | Discharge: 2008-12-28 | Payer: Self-pay | Admitting: Emergency Medicine

## 2008-12-28 ENCOUNTER — Encounter (INDEPENDENT_AMBULATORY_CARE_PROVIDER_SITE_OTHER): Payer: Self-pay | Admitting: *Deleted

## 2008-12-28 ENCOUNTER — Ambulatory Visit: Payer: Self-pay | Admitting: Internal Medicine

## 2008-12-28 ENCOUNTER — Encounter: Payer: Self-pay | Admitting: Cardiology

## 2008-12-31 ENCOUNTER — Encounter (INDEPENDENT_AMBULATORY_CARE_PROVIDER_SITE_OTHER): Payer: Self-pay | Admitting: *Deleted

## 2009-01-01 ENCOUNTER — Encounter (INDEPENDENT_AMBULATORY_CARE_PROVIDER_SITE_OTHER): Payer: Self-pay | Admitting: *Deleted

## 2009-01-04 ENCOUNTER — Ambulatory Visit: Payer: Self-pay | Admitting: Internal Medicine

## 2009-01-14 ENCOUNTER — Ambulatory Visit: Payer: Self-pay | Admitting: Cardiology

## 2009-01-14 ENCOUNTER — Ambulatory Visit: Payer: Self-pay | Admitting: Internal Medicine

## 2009-01-21 ENCOUNTER — Ambulatory Visit: Payer: Self-pay | Admitting: Internal Medicine

## 2009-01-28 ENCOUNTER — Ambulatory Visit: Payer: Self-pay | Admitting: Internal Medicine

## 2009-02-11 ENCOUNTER — Ambulatory Visit: Payer: Self-pay | Admitting: Internal Medicine

## 2009-02-11 ENCOUNTER — Encounter: Payer: Self-pay | Admitting: Cardiology

## 2009-03-11 ENCOUNTER — Ambulatory Visit: Payer: Self-pay | Admitting: Internal Medicine

## 2009-03-11 ENCOUNTER — Ambulatory Visit: Payer: Self-pay | Admitting: Cardiology

## 2009-03-11 ENCOUNTER — Encounter (INDEPENDENT_AMBULATORY_CARE_PROVIDER_SITE_OTHER): Payer: Self-pay | Admitting: Cardiology

## 2009-03-11 ENCOUNTER — Encounter: Payer: Self-pay | Admitting: Cardiology

## 2009-03-11 LAB — CONVERTED CEMR LAB: POC INR: 2.1

## 2009-04-10 ENCOUNTER — Ambulatory Visit: Payer: Self-pay | Admitting: Cardiology

## 2009-06-04 ENCOUNTER — Encounter: Payer: Self-pay | Admitting: Cardiovascular Disease

## 2009-06-05 ENCOUNTER — Encounter: Payer: Self-pay | Admitting: Cardiology

## 2009-07-09 ENCOUNTER — Encounter: Payer: Self-pay | Admitting: Cardiology

## 2009-07-11 ENCOUNTER — Ambulatory Visit: Payer: Self-pay | Admitting: Cardiovascular Disease

## 2009-07-11 ENCOUNTER — Telehealth: Payer: Self-pay | Admitting: Cardiology

## 2009-07-15 ENCOUNTER — Encounter (HOSPITAL_COMMUNITY): Admission: RE | Admit: 2009-07-15 | Discharge: 2009-09-10 | Payer: Self-pay | Admitting: Cardiovascular Disease

## 2009-07-15 ENCOUNTER — Ambulatory Visit: Payer: Self-pay | Admitting: Internal Medicine

## 2009-07-15 ENCOUNTER — Encounter: Payer: Self-pay | Admitting: Internal Medicine

## 2009-07-15 ENCOUNTER — Ambulatory Visit: Payer: Self-pay

## 2009-07-16 ENCOUNTER — Telehealth: Payer: Self-pay | Admitting: Cardiovascular Disease

## 2009-07-26 ENCOUNTER — Ambulatory Visit: Payer: Self-pay | Admitting: Cardiology

## 2009-07-26 DIAGNOSIS — I69993 Ataxia following unspecified cerebrovascular disease: Secondary | ICD-10-CM | POA: Insufficient documentation

## 2009-07-26 DIAGNOSIS — R269 Unspecified abnormalities of gait and mobility: Secondary | ICD-10-CM

## 2009-07-26 DIAGNOSIS — R5381 Other malaise: Secondary | ICD-10-CM | POA: Insufficient documentation

## 2009-07-26 DIAGNOSIS — R5383 Other fatigue: Secondary | ICD-10-CM

## 2009-08-29 ENCOUNTER — Ambulatory Visit (HOSPITAL_COMMUNITY): Admission: RE | Admit: 2009-08-29 | Discharge: 2009-08-29 | Payer: Self-pay | Admitting: Cardiology

## 2009-08-29 ENCOUNTER — Ambulatory Visit: Payer: Self-pay | Admitting: Cardiology

## 2009-08-29 ENCOUNTER — Encounter: Payer: Self-pay | Admitting: Cardiology

## 2009-08-29 ENCOUNTER — Ambulatory Visit: Payer: Self-pay

## 2009-09-03 ENCOUNTER — Ambulatory Visit: Payer: Self-pay | Admitting: Cardiology

## 2009-09-05 ENCOUNTER — Telehealth: Payer: Self-pay | Admitting: Cardiology

## 2009-09-10 ENCOUNTER — Ambulatory Visit: Payer: Self-pay | Admitting: Cardiology

## 2009-09-10 DIAGNOSIS — I119 Hypertensive heart disease without heart failure: Secondary | ICD-10-CM | POA: Insufficient documentation

## 2009-09-16 ENCOUNTER — Encounter: Payer: Self-pay | Admitting: Cardiology

## 2009-09-16 LAB — CONVERTED CEMR LAB
Calcium: 9.7 mg/dL (ref 8.4–10.5)
Creatinine, Ser: 0.8 mg/dL (ref 0.4–1.2)
Glucose, Bld: 112 mg/dL — ABNORMAL HIGH (ref 70–99)
Potassium: 3.6 meq/L (ref 3.5–5.1)
Sodium: 144 meq/L (ref 135–145)

## 2009-10-09 ENCOUNTER — Encounter: Admission: RE | Admit: 2009-10-09 | Discharge: 2009-10-09 | Payer: Self-pay | Admitting: Family Medicine

## 2009-10-22 ENCOUNTER — Telehealth: Payer: Self-pay | Admitting: Cardiology

## 2009-10-29 ENCOUNTER — Ambulatory Visit: Payer: Self-pay | Admitting: Cardiology

## 2009-10-31 LAB — CONVERTED CEMR LAB
Basophils Absolute: 0 10*3/uL (ref 0.0–0.1)
Basophils Relative: 0.4 % (ref 0.0–3.0)
Lymphs Abs: 1.5 10*3/uL (ref 0.7–4.0)
MCV: 90.4 fL (ref 78.0–100.0)
Monocytes Absolute: 0.4 10*3/uL (ref 0.1–1.0)
Monocytes Relative: 6.8 % (ref 3.0–12.0)
Platelets: 154 10*3/uL (ref 150.0–400.0)

## 2009-11-01 ENCOUNTER — Encounter: Payer: Self-pay | Admitting: Cardiology

## 2009-12-06 ENCOUNTER — Encounter (INDEPENDENT_AMBULATORY_CARE_PROVIDER_SITE_OTHER): Payer: Self-pay | Admitting: *Deleted

## 2009-12-30 ENCOUNTER — Ambulatory Visit: Payer: Self-pay | Admitting: Cardiology

## 2010-03-02 ENCOUNTER — Encounter: Payer: Self-pay | Admitting: Family Medicine

## 2010-03-02 ENCOUNTER — Encounter: Payer: Self-pay | Admitting: Diagnostic Neuroimaging

## 2010-03-05 ENCOUNTER — Telehealth (INDEPENDENT_AMBULATORY_CARE_PROVIDER_SITE_OTHER): Payer: Self-pay | Admitting: *Deleted

## 2010-03-07 ENCOUNTER — Encounter: Payer: Self-pay | Admitting: Physician Assistant

## 2010-03-07 ENCOUNTER — Encounter: Payer: Self-pay | Admitting: Internal Medicine

## 2010-03-07 ENCOUNTER — Ambulatory Visit
Admission: RE | Admit: 2010-03-07 | Discharge: 2010-03-07 | Payer: Self-pay | Source: Home / Self Care | Attending: Internal Medicine | Admitting: Internal Medicine

## 2010-03-07 ENCOUNTER — Ambulatory Visit
Admission: RE | Admit: 2010-03-07 | Discharge: 2010-03-07 | Payer: Self-pay | Source: Home / Self Care | Attending: Physician Assistant | Admitting: Physician Assistant

## 2010-03-07 DIAGNOSIS — R079 Chest pain, unspecified: Secondary | ICD-10-CM | POA: Insufficient documentation

## 2010-03-07 DIAGNOSIS — I5033 Acute on chronic diastolic (congestive) heart failure: Secondary | ICD-10-CM | POA: Insufficient documentation

## 2010-03-10 ENCOUNTER — Telehealth: Payer: Self-pay | Admitting: Internal Medicine

## 2010-03-13 NOTE — Assessment & Plan Note (Signed)
Summary: 4 month rov  Medications Added FUROSEMIDE 20 MG TABS (FUROSEMIDE) as needed      Allergies Added: NKDA  Visit Type:  Follow-up Primary Provider:  dr gage in [place]   History of Present Illness: The patient is 77 years old and return for management of atrial fibrillation, pacemaker, and diastolic CHF. She has sick sinus syndrome with paroxysmal atrial fibrillation and has a DDD pacemaker in place. She also has a history of diastolic heart failure. She has  been evaluated for chest pain and fatigue. She had a negative CT angiogram negative Myoview. 10 echo which showed good LV function with an ejection fraction of 60%. She did have LVH.  She was also having symptoms of ataxia and we had arranged a neurology consultation and they recommended rehabilitation. She did this for 3 times but has decided not to continue.  We started  Pradaxa after her last visit.  Current Medications (verified): 1)  Synthroid 88 Mcg Tabs (Levothyroxine Sodium) .Marland Kitchen.. 1 Ta B Once Daily 2)  Diltiazem Hcl Cr 180 Mg Xr24h-Cap (Diltiazem Hcl) .... Take One Tablet By Mouth Once Daily 3)  Pradaxa 150 Mg Caps (Dabigatran Etexilate Mesylate) .... Take One Capsule By Mouth Two Times A Day 4)  Furosemide 20 Mg Tabs (Furosemide) .... As Needed 5)  Calicum .Marland Kitchen.. 1 Tab Qd  Allergies (verified): No Known Drug Allergies  Past History:  Past Medical History: Reviewed history from 01/29/2008 and no changes required.  1. Sick sinus syndrome, with paroxysmal atrial fibrillation. 2. Status post Medtronic DDD pacemaker implantation. 3. Hypertension. 4. History of mastectomy for breast cancer. 5. Gastroesophageal reflux disease.   Review of Systems       ROS is negative except as outlined in HPI.   Vital Signs:  Patient profile:   77 year old female Height:      64 inches Weight:      166 pounds BMI:     28.60 Pulse rate:   78 / minute BP sitting:   130 / 80  (left arm)  Vitals Entered By: Laurance Flatten  CMA (December 30, 2009 2:16 PM)  Physical Exam  Additional Exam:  Gen. Well-nourished, in no distress   Neck: No JVD, thyroid not enlarged, no carotid bruits Lungs: No tachypnea, clear without rales, rhonchi or wheezes Cardiovascular: Rhythm regular, PMI not displaced,  heart sounds  normal, no murmurs or gallops, no peripheral edema, pulses normal in all 4 extremities. Abdomen: BS normal, abdomen soft and non-tender without masses or organomegaly, no hepatosplenomegaly. MS: No deformities, no cyanosis or clubbing   Neuro:  No focal sns   Skin:  no lesions    PPM Specifications Following MD:  Everardo Beals. Juanda Chance, MD     PPM Vendor:  Medtronic     PPM Model Number:  ADDR01     PPM Serial Number:  [id number] PPM DOI:  06/11/2006     PPM Implanting MD:  Everardo Beals. Juanda Chance, MD  Lead 1    Location: RA     DOI: 06/11/2006     Model #: [id number]     Serial #: [id number]     Status: active Lead 2    Location: RV     DOI: 06/11/2006     Model #: [id number]     Serial #: [id number]     Status: active  Magnet Response Rate:  BOL 85 ERI  65  Indications:  Sick sinus syndrome  Explantation Comments:  Carelink  PPM Follow Up Pacer  Dependent:  Yes      Episodes Coumadin:  Yes  Parameters Mode:  DDDR     Lower Rate Limit:  60     Upper Rate Limit:  105 Paced AV Delay:  160     Sensed AV Delay:  140  Impression & Recommendations:  Problem # 1:  ATRIAL FIBRILLATION (ICD-427.31) She has paroxysmal atrial fibrillation which is been managed with rate control and Coumadin. She was recently switched to Pradaxa. She has had no palpitations and this problem appears stable.  Problem # 2:  PACEMAKER, PERMANENT (ICD-V45.01) She has a DDD pacemaker. We'll plan interrogation today. We will plan followup with Dr. Johney Frame in 6 months for her pacemaker and other medical problems. Orders: EKG w/ Interpretation (93000)  Problem # 3:  HYPERTENSION, BENIGN (ICD-401.1) This is well-controlled on current therapy. The  following medications were removed from the medication list:    Cozaar 50 Mg Tabs (Losartan potassium) .Marland Kitchen... 1 tab once daily Her updated medication list for this problem includes:    Diltiazem Hcl Cr 180 Mg Xr24h-cap (Diltiazem hcl) .Marland Kitchen... Take one tablet by mouth once daily    Furosemide 20 Mg Tabs (Furosemide) .Marland Kitchen... As needed  Patient Instructions: 1)  Your physician wants you to follow-up in:  6 months with Dr. Johney Frame. You will receive a reminder letter in the mail two months in advance. If you don't receive a letter, please call our office to schedule the follow-up appointment. 2)  Your physician recommends that you continue on your current medications as directed. Please refer to the Current Medication list given to you today. Prescriptions: PRADAXA 150 MG CAPS (DABIGATRAN ETEXILATE MESYLATE) take one capsule by mouth two times a day  #180 x 3   Entered by:   Sherri Rad, RN, BSN   Authorized by:   Lenoria Farrier, MD, [facility]   Signed by:   Sherri Rad, RN, BSN on 12/30/2009   Method used:   Electronically to        [facility]* (mail-order)       [address]       [place], [place]  [address]       Ph: [phone]       Fax: [phone]   RxID:   [id number]

## 2010-03-13 NOTE — Progress Notes (Signed)
Summary: Pradaxa- PA approval (LMTC)/rtn call from yesterday   Phone Note Outgoing Call Call back at [phone]   Call placed by: Sherri Rad, RN, BSN,  September 05, 2009 1:14 PM Call placed to: [facility]- PA Summary of Call: I called and spoke with Alecia Lemming at [facility]. Prior authorization approval received. The [name]'s co-pay will be $45. I will notify the [name]. Sherri Rad, RN, BSN  September 05, 2009 1:16 PM   Follow-up for Phone Call        I attempted to call the [name] about her PA approval. She will need to come as scheduled next week for her bmp/cbc prior to starting pradaxa, then hold coumadin for 3 days prior to starting. She will need a CBC 7-10 post start. I have asked her to call us back.  [name] rtn call from yesterday pls call her back at [phone] or [phone] [name]  September 06, 2009 10:24 AM  Follow-up by: Sherri Rad, RN, BSN,  September 05, 2009 5:39 PM  Additional Follow-up for Phone Call Additional follow up Details #1::        I have spoken with [name] about the co-pay.   Lisabeth Devoid RN The Pradaxa will be $135.00 for a 90 day supply.  [name] says that is much better and will come in for lab work as scheduled.  She will call back once she has started Pradaxa for post lab work.  She understands to hold Coumadin for 3 days prior to starting Pradaxa. Lisabeth Devoid RN

## 2010-03-13 NOTE — Assessment & Plan Note (Signed)
Summary: f59m  Medications Added SYNTHROID 88 MCG TABS (LEVOTHYROXINE SODIUM) 1 ta b once daily DILTIAZEM HCL CR 180 MG XR24H-CAP (DILTIAZEM HCL) take one tablet by mouth once daily      Allergies Added: NKDA  Visit Type:  Follow-up Primary Provider:  dr gage in [place]  CC:  chest pain.  History of Present Illness: [name] is 77 years old and return for followup management of atrial fibrillation and her pacemaker. She has paroxysmal atrial fibrillation and has been treated with rate control and anticoagulation. She was in the in ENGAGE  trial but she developed some side effects with some weakness that she thought might be related to study drug and she stopped the drug and is now back on Coumadin.  She also has a Medtronic DDD pacemaker.  Other problems include hypertension GERD. She was also hospitalized with chest pain and had a negative CT and general in recent months.  Current Medications (verified): 1)  Synthroid 88 Mcg Tabs (Levothyroxine Sodium) .Marland Kitchen.. 1 Ta B Once Daily 2)  Diltiazem Hcl Cr 120 Mg Xr12h-Cap (Diltiazem Hcl) .Marland Kitchen.. 1 Tab Daily 3)  Benicar 20 Mg Tabs (Olmesartan Medoxomil) .Marland Kitchen.. 1 Tablet By Mouth Daily 4)  Vitamin D3 1000 Unit Caps (Cholecalciferol) .... Take One Tablet By Mouth Once Daily. 5)  Citracal Plus Heart Health 315-250-200 Mg-Unit-Mg Tabs (Calcium-Vit D3-Phytosterols) .... Take One Tablet By Mouth Once Daily. 6)  Warfarin Sodium 2.5 Mg Tabs (Warfarin Sodium) .... Use As Directed By Anticoagulation Clinic  Allergies (verified): No Known Drug Allergies  Past History:  Past Medical History: Reviewed history from 01/29/2008 and no changes required.  1. Sick sinus syndrome, with paroxysmal atrial fibrillation. 2. Status post Medtronic DDD pacemaker implantation. 3. Hypertension. 4. History of mastectomy for breast cancer. 5. Gastroesophageal reflux disease.   Review of Systems       ROS is negative except as outlined in HPI.   Vital  Signs:  Patient profile:   77 year old female Height:      64 inches Weight:      172 pounds Pulse rate:   65 / minute BP sitting:   146 / 77  (left arm) Cuff size:   large  Vitals Entered By: Burnett Kanaris, CNA (April 10, 2009 2:59 PM)  Physical Exam  Additional Exam:  Gen. Well-nourished, in no distress   Neck: No JVD, thyroid not enlarged, no carotid bruits Lungs: No tachypnea, clear without rales, rhonchi or wheezes Cardiovascular: Rhythm regular, PMI not displaced,  heart sounds  normal, grade 1-2/6 systolic murmur at the left sternal edge, no peripheral edema, pulses normal in all 4 extremities. Abdomen: BS normal, abdomen soft and non-tender without masses or organomegaly, no hepatosplenomegaly. MS: No deformities, no cyanosis or clubbing   Neuro:  No focal sns   Skin:  no lesions    PPM Specifications Following MD:  Everardo Beals. Juanda Chance, MD     PPM Vendor:  Medtronic     PPM Model Number:  ADDR01     PPM Serial Number:  [id number] PPM DOI:  06/11/2006     PPM Implanting MD:  Everardo Beals. Juanda Chance, MD  Lead 1    Location: RA     DOI: 06/11/2006     Model #: 4098     Serial #: [id number]     Status: active Lead 2    Location: RV     DOI: 06/11/2006     Model #: [id number]     Serial #:  [id number]     Status: active  Magnet Response Rate:  BOL 85 ERI  65  Indications:  Sick sinus syndrome  Explantation Comments:  Carelink  PPM Follow Up Remote Check?  No Battery Voltage:  2.78 V     Battery Est. Longevity:  5 years     Pacer Dependent:  Yes       PPM Device Measurements Atrium  Amplitude: 1.0 mV, Impedance: 506 ohms, Threshold: 0.75 V at 0.4 msec Right Ventricle  Impedance: 543 ohms, Threshold: 0.625 V at 0.4 msec  Episodes MS Episodes:  7932     Percent Mode Switch:  71.5%     Coumadin:  Yes Ventricular High Rate:  0     Atrial Pacing:  38%     Ventricular Pacing:  98.5%  Parameters Mode:  DDDR     Lower Rate Limit:  60     Upper Rate Limit:  105 Paced AV Delay:  160      Sensed AV Delay:  140 Next Cardiology Appt Due:  10/10/2009 Tech Comments:  No parameter changes.  Device function normal.  ROV 6 months with Dr. Juanda Chance. Altha Harm, LPN  April 11, 6211 3:10 PM   Impression & Recommendations:  Problem # 1:  ATRIAL FIBRILLATION (ICD-427.31)  She has paroxysmal atrial fibrillation and interrogation of her pacemaker indicated she is in atrial fibrillation about 70% of the time. Her ventricular rates appear to be well controlled. She's not had any symptoms of palpitations. She is currently back on Coumadin. This appears under control.  Her updated medication list for this problem includes:    Warfarin Sodium 2.5 Mg Tabs (Warfarin sodium) ..... Use as directed by anticoagulation clinic  Orders: EKG w/ Interpretation (93000)  Problem # 2:  PACEMAKER (ICD-V45.Marland Kitchen01) She has a Medtronic DDD pacemaker. We interrogated today and the function was good. She is atrial sensing and ventricular pacing 60% of the time and she is atrial pacing and ventricular pacing 37% of the time. Atrial sensing and ventricular pacing is probably atrial fibrillation.  Patient Instructions: 1)  Your physician has recommended you make the following change in your medication: 1) Diltiazem 180mg  once daily  2)  Your physician wants you to follow-up in: 6 months.  You will receive a reminder letter in the mail two months in advance. If you don't receive a letter, please call our office to schedule the follow-up appointment. Prescriptions: DILTIAZEM HCL CR 180 MG XR24H-CAP (DILTIAZEM HCL) take one tablet by mouth once daily  #90 x 3   Entered by:   Sherri Rad, RN, BSN   Authorized by:   Lenoria Farrier, MD, [facility]   Signed by:   Sherri Rad, RN, BSN on 04/10/2009   Method used:   Electronically to        [facility] (mail-order)       [address]       [place], [place]  [address]       Ph: [phone]       Fax: [phone]   RxID:   [id number]

## 2010-03-13 NOTE — Letter (Signed)
Summary: Handout Printed  Printed Handout:  - Coumadin Instructions-w/out Meds 

## 2010-03-13 NOTE — Cardiovascular Report (Signed)
Summary: Office Visit   Office Visit   Imported By: Roderic Ovens 04/19/2009 11:07:20  _____________________________________________________________________  External Attachment:    Type:   Image     Comment:   External Document

## 2010-03-13 NOTE — Progress Notes (Signed)
Summary: Started Pradaxa   Phone Note Outgoing Call   Call placed by: Sherri Rad, RN, BSN,  October 22, 2009 12:39 PM Call placed to: Patient Summary of Call: I called the pt today to see if she has started Pradaxa. She states she did this about 2 weeks ago. I advised her we need to get her to come in for labs now. She will be in [place] on 9/20. We will order a CBC for that date. Initial call taken by: Sherri Rad, RN, BSN,  October 22, 2009 12:42 PM

## 2010-03-13 NOTE — Progress Notes (Signed)
  Phone Note Outgoing Call   Call placed by: Dessie Coma,  July 16, 2009 1:56 PM Call placed to: Patient Summary of Call: Patient notified per Dr. Freida Busman, stress test was normal.

## 2010-03-13 NOTE — Cardiovascular Report (Signed)
Summary: Office Visit  Office Visit   Imported By: Marylou Mccoy 02/23/2009 13:05:39  _____________________________________________________________________  External Attachment:    Type:   Image     Comment:   External Document

## 2010-03-13 NOTE — Miscellaneous (Signed)
Summary: Med change/ Neuro order  Clinical Lists Changes  Medications: Changed medication from WARFARIN SODIUM 2.5 MG TABS (WARFARIN SODIUM) Use as directed by Anticoagulation Clinic to PRADAXA 150 MG CAPS (DABIGATRAN ETEXILATE MESYLATE) take one capsule by mouth two times a day - Signed Rx of PRADAXA 150 MG CAPS (DABIGATRAN ETEXILATE MESYLATE) take one capsule by mouth two times a day;  #180 x 3;  Signed;  Entered by: Sherri Rad, RN, BSN;  Authorized by: Lenoria Farrier, MD, [facility];  Method used: Electronically to [facility] [address] [place]  [address], Ph: [phone], Fax: [phone] Orders: Added new Referral order of Neurology Referral (Neuro) - Signed    Prescriptions: PRADAXA 150 MG CAPS (DABIGATRAN ETEXILATE MESYLATE) take one capsule by mouth two times a day  #180 x 3   Entered by:   Sherri Rad, RN, BSN   Authorized by:   Lenoria Farrier, MD, [facility]   Signed by:   Sherri Rad, RN, BSN on 09/16/2009   Method used:   Electronically to        [facility] (mail-order)       [address]       [place], [place]  [address]       Ph: [phone]       Fax: [phone]   RxID:   [id number]

## 2010-03-13 NOTE — Letter (Signed)
Summary: [facility] Office Visit Note   [facility] Office Visit Note   Imported By: Roderic Ovens 11/15/2009 13:54:01  _____________________________________________________________________  External Attachment:    Type:   Image     Comment:   External Document

## 2010-03-13 NOTE — Cardiovascular Report (Signed)
Summary: Office Visit   Office Visit   Imported By: Roderic Ovens 02/06/2010 09:53:13  _____________________________________________________________________  External Attachment:    Type:   Image     Comment:   External Document

## 2010-03-13 NOTE — Progress Notes (Signed)
Summary: chest pain & arm pain   Phone Note Call from Patient Call back at Home Phone [phone]   Caller: Patient Reason for Call: Talk to Nurse Summary of Call: pt stated that she had some chest pain and left arm pain yesterday and last night. didnt take any nitro. NO vomitting. Pt has a pacemaker. Initial call taken by: Edman Circle,  July 11, 2009 9:04 AM  Follow-up for Phone Call        Called patient back...she states that she woke up with chest pain and left arm pain last night. She states that she feels OK today but the episode last night scared her. She states that she has been under a lot of stress because her son has been in the hospital. She will see Dr. Freida Busman today at 1130 am in the [place] office to be evaluated.  Follow-up by: Suzan Garibaldi RN

## 2010-03-13 NOTE — Assessment & Plan Note (Signed)
Summary: pain at the pacer site/mt  Medications Added EXTRA STRENGTH ACETAMINOPHEN 500 MG CAPS (ACETAMINOPHEN) Take 2 tablets every 8 hours as needed for pain      Allergies Added: NKDA  Visit Type:  Follow-up Primary Provider:  dr gage in [place]  CC:  pain at pacemaker site.  History of Present Illness: Primary Electrophysiologist:  Dr. Hillis Range  [name] is a 77 yo female with a h/o paroxysmal atrial fibrillation, pacemaker implantation secondary to sick sinus syndrome, diastolic heart failure, hypertension and acid reflux.  She was previously followed by Dr. Juanda Chance.  She will now be followed by Dr. Johney Frame.  Her last echocardiogram was in July 2011.  This demonstrated an ejection fraction of 60-65%, mild LVH, grade 2 diastolic dysfunction, mild mitral regurgitation, biatrial enlargement, PASP 39-43, mild pulmonary hypertension.  Her last Myoview study was in June 2011 and demonstrated no ischemia with an ejection fraction of 65%.  She presents today with pain over her pacemaker site after recent fall.  Her pacemaker has been interrogated and is functioning properly.  She lifted a sewing machine a couple of days ago.  She started having pain over her pacer site after that.  She also notes significant arthritis in her left shoulder.  This is worse as well.  She denies any substernal heaviness.  She denies increased shortness of breath  She denies orthopnea, PND or significant pedal edema.  She denies syncope.  She has seen Dr. Juanda Chance in the past for fatigue.  She had a significant workup as outlined above.  She has also seen her primary care provider recently who has drawn several labs and she has followup arranged.  Of note, she does need eye surgery at some point in the next couple of weeks.  She's not sure what she is supposed to do about her Pradaxa.  Current Medications (verified): 1)  Synthroid 88 Mcg Tabs (Levothyroxine Sodium) .Marland Kitchen.. 1 Ta B Once Daily 2)  Diltiazem Hcl Cr 180  Mg Xr24h-Cap (Diltiazem Hcl) .... Take One Tablet By Mouth Once Daily 3)  Pradaxa 150 Mg Caps (Dabigatran Etexilate Mesylate) .... Take One Capsule By Mouth Two Times A Day 4)  Furosemide 20 Mg Tabs (Furosemide) .... As Needed 5)  Calicum .Marland Kitchen.. 1 Tab Qd  Allergies (verified): No Known Drug Allergies  Past History:  Past Medical History: Last updated: 01/29/2008  1. Sick sinus syndrome, with paroxysmal atrial fibrillation. 2. Status post Medtronic DDD pacemaker implantation. 3. Hypertension. 4. History of mastectomy for breast cancer. 5. Gastroesophageal reflux disease.   Review of Systems       As per  the HPI.  All other systems reviewed and negative.   Vital Signs:  Patient profile:   77 year old female Height:      64 inches Weight:      171.50 pounds Pulse rate:   62 / minute BP sitting:   152 / 84  (right arm)  Vitals Entered By: Celestia Khat, CMA (March 07, 2010 10:05 AM)  Physical Exam  General:  Well nourished, well developed, in no acute distress HEENT: normal Neck: no JVD Cardiac:  normal S1, S2; RRR; no murmur Lungs:  clear to auscultation bilaterally, no wheezing, rhonchi or rales Abd: soft, nontender, no hepatomegaly Ext: trace bilat edema Skin: warm and dry Neuro:  CNs 2-12 intact, no focal abnormalities noted    EKG  Procedure date:  03/07/2010  Findings:      underlying afib hr 62 v paced  PPM Specifications Following MD:  Hillis Range, MD     PPM Vendor:  Medtronic     PPM Model Number:  ADDR01     PPM Serial Number:  [id number] PPM DOI:  06/11/2006     PPM Implanting MD:  Everardo Beals. Juanda Chance, MD  Lead 1    Location: RA     DOI: 06/11/2006     Model #: [id number]     Serial #: [id number]     Status: active Lead 2    Location: RV     DOI: 06/11/2006     Model #: [id number]     Serial #: [id number]     Status: active  Magnet Response Rate:  BOL 85 ERI  65  Indications:  Sick sinus syndrome  Explantation Comments:  Carelink  PPM Follow  Up Pacer Dependent:  Yes      Episodes Coumadin:  Yes  Parameters Mode:  DDDR     Lower Rate Limit:  60     Upper Rate Limit:  105 Paced AV Delay:  160     Sensed AV Delay:  140  Impression & Recommendations:  Problem # 1:  CHEST PAIN UNSPECIFIED (ICD-786.50) I suspect she is experiencing pain from her shoulder and her arthritis.  Her pacer site looks stable.  There is no erythema or discharge or tenderness with manipulation of the pacemaker.  Interrogation demonstrates normal function.  I will set her up for chest x-ray.  I've asked her to place heat on her shoulder.  She can take Tylenol as needed.  I have reassured her.  She should followup sooner if her symptoms do not resolve.  Otherwise, she can followup with Dr. Johney Frame as scheduled.  Problem # 2:  ATRIAL FIBRILLATION (ICD-427.31) She remains on Pradaxa.  She needs eye surgery in the next couple of weeks.  She does not have a history of stroke.  She does not have a history of valve replacement.  She should be able to stop her Pradaxa 2 days before her surgery and restart it afterwards as soon as it is felt to be safe.  Problem # 3:  CHRONIC DIASTOLIC HEART FAILURE (ICD-428.32) Her volume seems to be stable.  Continue current medications.  Orders: T-2 View CXR (71020TC)  Problem # 4:  HYPERTENSION, BENIGN (ICD-401.1) Continue current medications for now.  Her blood pressures are somewhat elevated.  This will continue to be monitored.  Problem # 5:  FATIGUE / MALAISE (ICD-780.79) Continue to pursue workup with her primary care provider.  As noted above, her cardiac workup several months ago was negative.  Patient Instructions: 1)  Your physician recommends that you schedule a follow-up appointment if needed prior to your scheduled appointment with Dr. Johney Frame in May.  Please call us if your pacemaker sight continues to bother you. 2)  You need to have a chest X-ray. This is done at our [facility]. office. 3)  Your physician  recommends that you continue on your current medications as directed. Please refer to the Current Medication list given to you today. You may take acetaminophen 1000mg  by mouth every 8 hours as needed for pain. 4)  Apply heat to left shoulder as needed. 5)  Please have your eye doctor send [place] paperwork regarding your planned eye surgery.

## 2010-03-13 NOTE — Miscellaneous (Signed)
Summary: dx correction  Clinical Lists Changes  Problems: Changed problem from PACEMAKER (ICD-V45..01) to PACEMAKER, PERMANENT (ICD-V45.01)  changed the incorrect dx code to correct dx code 

## 2010-03-13 NOTE — Medication Information (Signed)
Summary: Coumadin Clinic  Anticoagulant Therapy  Managed by: Inactive Referring MD: Dr. Juanda Chance PCP: dr gage in [place] Supervising MD: Clifton James MD, Cristal Deer Indication 1: Bradycardia - Pacemaker Indication 2: Atrial Fibrillation Lab Used: [facility] [place] Site: [address] INR RANGE  2 - 3          Comments: Pt states Dr. Renae Fickle in [place] is checking her coumadin levels.  Allergies: No Known Drug Allergies  Anticoagulation Management History:      Positive risk factors for bleeding include an age of 40 years or older.  The bleeding index is 'intermediate risk'.  Positive CHADS2 values include History of HTN and Age > 3 years old.  Her last INR was 1.5 RATIO.  Anticoagulation responsible provider: Clifton James MD, Cristal Deer.  Exp: 11/2009.    Anticoagulation Management Assessment/Plan:      The patient's current anticoagulation dose is Warfarin sodium 2.5 mg tabs: Use as directed by Anticoagulation Clinic.  The target INR is 2.0-3.0.  The next INR is due 03/13/2009.  Anticoagulation instructions were given to patient.  Results were reviewed/authorized by Inactive.         Prior Anticoagulation Instructions: INR 2.1 (Blinded)  Take 2 tabs = 5 mg daily for 2 days then check on Wednesday at Dr. Martha Clan office.

## 2010-03-13 NOTE — Letter (Signed)
Summary: [employer] Approval Letter  [employer] Approval Letter   Imported By: Roderic Ovens 03/05/2009 11:52:27  _____________________________________________________________________  External Attachment:    Type:   Image     Comment:   External Document

## 2010-03-13 NOTE — Letter (Signed)
Summary: [facility] Office Visit Note   [facility] Office Visit Note   Imported By: Roderic Ovens 11/15/2009 13:56:54  _____________________________________________________________________  External Attachment:    Type:   Image     Comment:   External Document

## 2010-03-13 NOTE — Progress Notes (Signed)
Summary: pain around pacemaker   Phone Note Call from Patient Call back at Home Phone [phone]   Caller: Patient Reason for Call: Talk to Nurse Summary of Call: pt is having pain where her pacemaker is at. Initial call taken by: Roe Coombs,  March 05, 2010 9:14 AM  Follow-up for Phone Call        Spoke with the patient, she is c/o random pain @ the device site.  She was concerned that she damaged the pacemaker by lifting her sewing machine.  She would like to be scheduled to see Dr. Johney Frame.  I will have one of the schedulers call her with an appointment. Follow-up by: Altha Harm, LPN,  March 05, 2010 3:16 PM

## 2010-03-13 NOTE — Assessment & Plan Note (Signed)
Summary: 6wk f/u  Medications Added FUROSEMIDE 20 MG TABS (FUROSEMIDE) 1 tab two times a day FUROSEMIDE 20 MG TABS (FUROSEMIDE) take 3 tablets by mouth once daily      Allergies Added: NKDA  Primary Provider:  dr gage in [place]  CC:  weakness and SOB with exertion (the pt has not taken Cozaar for the past week--Rx arrived today).  History of Present Illness: the patient is 77 years old and return for management of atrial fibrillation, pacemaker, and diastolic CHF. She has sick sinus syndrome with paroxysmal atrial fibrillation and has a DDD pacemaker in place. She also has a history of diastolic heart failure. She has recently been evaluated for chest pain and fatigue. She had a negative CT angiogram negative Myoview. 10 echo which showed good LV function with an ejection fraction of 60%. She did have LVH.  She was also having symptoms of ataxia and we had arranged a neurology consultation but she didn't follow through with this because the neurologist we chose was none and her insurance coverage.  We also considered Pradaxa but this was too expensive and needs preapproval  Current Medications (verified): 1)  Synthroid 88 Mcg Tabs (Levothyroxine Sodium) .Marland Kitchen.. 1 Ta B Once Daily 2)  Diltiazem Hcl Cr 180 Mg Xr24h-Cap (Diltiazem Hcl) .... Take One Tablet By Mouth Once Daily 3)  Vitamin D3 1000 Unit Caps (Cholecalciferol) .... Take One Tablet By Mouth Once Daily. 4)  Warfarin Sodium 2.5 Mg Tabs (Warfarin Sodium) .... Use As Directed By Anticoagulation Clinic 5)  Cozaar 50 Mg Tabs (Losartan Potassium) .Marland Kitchen.. 1 Tab Once Daily 6)  Furosemide 20 Mg Tabs (Furosemide) .Marland Kitchen.. 1 Tab Two Times A Day 7)  Calicum .Marland Kitchen.. 1 Tab Qd 8)  Vit B 12 .... Daily  Allergies (verified): No Known Drug Allergies  Past History:  Past Medical History: Reviewed history from 01/29/2008 and no changes required.  1. Sick sinus syndrome, with paroxysmal atrial fibrillation. 2. Status post Medtronic DDD pacemaker  implantation. 3. Hypertension. 4. History of mastectomy for breast cancer. 5. Gastroesophageal reflux disease.   Review of Systems       ROS is negative except as outlined in HPI.   Vital Signs:  Patient profile:   77 year old female Weight:      175.25 pounds BMI:     30.19 Pulse rate:   63 / minute BP sitting:   154 / 66  (left arm)  Vitals Entered By: Julieta Gutting, RN, BSN (September 03, 2009 2:24 PM)  Physical Exam  Additional Exam:  Gen. Well-nourished, in no distress   Neck: questionable JVD at the clavicle, thyroid not enlarged, no carotid bruits Lungs: No tachypnea, clear without rales, rhonchi or wheezes Cardiovascular: Rhythm regular, PMI not displaced,  heart sounds  normal, no murmurs or gallops, 1+ bilateral peripheral edema, pulses normal in all 4 extremities. Abdomen: BS normal, abdomen soft and non-tender without masses or organomegaly, no hepatosplenomegaly. MS: No deformities, no cyanosis or clubbing   Neuro:  No focal sns   Skin:  no lesions    PPM Specifications Following MD:  Everardo Beals. Juanda Chance, MD     PPM Vendor:  Medtronic     PPM Model Number:  ADDR01     PPM Serial Number:  [id number] PPM DOI:  06/11/2006     PPM Implanting MD:  Everardo Beals. Juanda Chance, MD  Lead 1    Location: RA     DOI: 06/11/2006     Model #: [id number]  Serial #: [id number]     Status: active Lead 2    Location: RV     DOI: 06/11/2006     Model #: [id number]     Serial #: [id number]     Status: active  Magnet Response Rate:  BOL 85 ERI  65  Indications:  Sick sinus syndrome  Explantation Comments:  Carelink  PPM Follow Up Remote Check?  No Battery Voltage:  2.77 V     Battery Est. Longevity:  5 years     Pacer Dependent:  Yes       PPM Device Measurements Atrium  Impedance: 471 ohms, Threshold: 0.75 V at 0.4 msec Right Ventricle  Impedance: 563 ohms, Threshold: 0.625 V at 0.4 msec  Episodes MS Episodes:  138     Percent Mode Switch:  0.1%     Coumadin:  Yes Ventricular High Rate:  0      Atrial Pacing:  88.4%     Ventricular Pacing:  100%  Parameters Mode:  DDDR     Lower Rate Limit:  60     Upper Rate Limit:  105 Paced AV Delay:  160     Sensed AV Delay:  140 Tech Comments:  Interrogation only.  138 mode switch episodes the longest 21:57 minutes, + coumadin. Altha Harm, LPN  September 03, 2009 3:14 PM   Impression & Recommendations:  Problem # 1:  ATRIAL FIBRILLATION (ICD-427.31) She has paroxysmal atrial fibrillation which is managed with rate control and Coumadin. She is having symptoms of fatigue which he thinks are related to Coumadin. I told her this was not a likely side effect. We are trying to get her on Pradaxa  but she needs preapproval for this. Her updated medication list for this problem includes:    Warfarin Sodium 2.5 Mg Tabs (Warfarin sodium) ..... Use as directed by anticoagulation clinic  Problem # 2:  EDEMA (ICD-782.3) She has peripheral edema and probable mild diastolic CHF. Echocardiogram showed LVH. We will increase her Lasix to 60 mg daily.  Problem # 3:  PACEMAKER (ICD-V45.Marland Kitchen01) she has a DDD pacemaker. We will plan to interrogate this to try and keep her intrinsic rhythm working most of time. we interrogated her pacemaker and she is pacing the A&P almost all the time. Unfortunately her AV delay is 400 ms so it does not look like we will able to allow her to have intrinsic conduction.  Problem # 4:  ATAXIA AS LATE EFFECT OF CEREBROVASCULAR DISEASE (ICD-438.84) We will try to obtain neurology consultation with the neurologist who is covered by her insurance. Her updated medication list for this problem includes:    Warfarin Sodium 2.5 Mg Tabs (Warfarin sodium) ..... Use as directed by anticoagulation clinic  Patient Instructions: 1)  We will call to try and get prior authorization for Pradaxa. 2)  Increase lasix (furosemide) to 60mg  once daily. 3)  Your physician recommends that you return for lab work in: 1 week- bmet/cbc (427.31;402.10;428.22) 4)   Your physician recommends that you schedule a follow-up appointment in: 4 months with Dr. Juanda Chance 5)  Please call us with the approved list of neurologist that you have.

## 2010-03-13 NOTE — Medication Information (Signed)
Summary: new coumadin eval per research.  Anticoagulant Therapy  Managed by: Shelby Dubin, PharmD, BCPS, CPP Referring MD: Dr. Juanda Chance PCP: dr gage in [place] Supervising MD: Shirlee Latch MD, Freida Busman Indication 1: Bradycardia - Pacemaker Indication 2: Atrial Fibrillation Lab Used: [facility] [place] Site: [address] INR POC 2.1 INR RANGE  2 - 3  Dietary changes: no    Health status changes: no    Bleeding/hemorrhagic complications: no    Recent/future hospitalizations: no    Any changes in medication regimen? no    Recent/future dental: no  Any missed doses?: no       Is patient compliant with meds? yes      Comments: INR is blinded end of study INR today 2.1.    Current Medications (verified): 1)  Synthroid 100 Mcg Tabs (Levothyroxine Sodium) .... Take 1 Tab Daily 2)  Diltiazem Hcl Cr 120 Mg Xr12h-Cap (Diltiazem Hcl) .Marland Kitchen.. 1 Tab Daily 3)  Benicar 20 Mg Tabs (Olmesartan Medoxomil) .Marland Kitchen.. 1 Tablet By Mouth Daily 4)  Vitamin D3 1000 Unit Caps (Cholecalciferol) .... Take One Tablet By Mouth Once Daily. 5)  Vitamin B-12 5000 Mcg Subl (Cyanocobalamin) .... Take One Tablet By Mouth Once Daily. 6)  Citracal Plus Heart Health 315-250-200 Mg-Unit-Mg Tabs (Calcium-Vit D3-Phytosterols) .... Take One Tablet By Mouth Once Daily. 7)  Warfarin Sodium 2.5 Mg Tabs (Warfarin Sodium) .... Use As Directed By Anticoagulation Clinic  Allergies (verified): No Known Drug Allergies  Anticoagulation Management History:      Positive risk factors for bleeding include an age of 76 years or older.  The bleeding index is 'intermediate risk'.  Positive CHADS2 values include History of HTN and Age > 34 years old.  Her last INR was 1.5 RATIO.  Anticoagulation responsible provider: Shirlee Latch MD, Yehoshua Vitelli.  INR POC: 2.1.  Exp: 11/2009.    Anticoagulation Management Assessment/Plan:      The patient's current anticoagulation dose is Warfarin sodium 2.5 mg tabs: Use as directed by Anticoagulation  Clinic.  The target INR is 2.0-3.0.  The next INR is due 03/13/2009.  Anticoagulation instructions were given to patient.  Results were reviewed/authorized by Shelby Dubin, PharmD, BCPS, CPP.  She was notified by Shelby Dubin PharmD, BCPS, CPP.         Prior Anticoagulation Instructions: INR 3.4 more than goal 2-3 have some greens today  Coumadin 1 tab = 5mg  each day  except 1 and 1/2 tab = 7.5mg  on Mon  Current Anticoagulation Instructions: INR 2.1 (blinded)  Start 5 mg daily for 2 days.  Recheck in 2 days at Dr. Samuel Germany.  The patient is to hold four doses of coumadin.  The dosage to be resumed includes:  Prescriptions: WARFARIN SODIUM 2.5 MG TABS (WARFARIN SODIUM) Use as directed by Anticoagulation Clinic  #45 x 1   Entered by:   Shelby Dubin PharmD, BCPS, CPP   Authorized by:   Marca Ancona, MD   Signed by:   Shelby Dubin PharmD, BCPS, CPP on 03/11/2009   Method used:   Electronically to        [facility]retail[facility]       [address]       [place], [place]  [address]       Ph: [phone] or [phone]       Fax: [phone]   RxID:   [id number]   Appended Document: Coumadin Clinic    Anticoagulant Therapy  Managed  by: Shelby Dubin, PharmD, BCPS, CPP Referring MD: Dr. Juanda Chance PCP: dr gage in [place] MD: Shirlee Latch MD, Freida Busman Indication 1: Bradycardia - Pacemaker Indication 2: Atrial Fibrillation Lab Used: [facility] [place] Site: [address] INR RANGE  2 - 3           Allergies: No Known Drug Allergies  Anticoagulation Management History:      Positive risk factors for bleeding include an age of 73 years or older.  The bleeding index is 'intermediate risk'.  Positive CHADS2 values include History of HTN and Age > 43 years old.  Her last INR was 1.5 RATIO.  Anticoagulation responsible provider: Shirlee Latch MD, Niobe Dick.  Exp: 11/2009.    Anticoagulation Management Assessment/Plan:      The patient's  current anticoagulation dose is Warfarin sodium 2.5 mg tabs: Use as directed by Anticoagulation Clinic.  The target INR is 2.0-3.0.  The next INR is due 03/13/2009.  Anticoagulation instructions were given to patient.  Results were reviewed/authorized by Shelby Dubin, PharmD, BCPS, CPP.  She was notified by Shelby Dubin PharmD, BCPS, CPP.         Prior Anticoagulation Instructions: INR 2.1 (blinded)  Start 5 mg daily for 2 days.  Recheck in 2 days at Dr. Samuel Germany.  The patient is to hold four doses of coumadin.  The dosage to be resumed includes:   Current Anticoagulation Instructions: INR 2.1 (Blinded)  Take 2 tabs = 5 mg daily for 2 days then check on Wednesday at Dr. Martha Clan office.

## 2010-03-13 NOTE — Cardiovascular Report (Signed)
Summary: Office Visit   Office Visit   Imported By: Roderic Ovens 09/05/2009 15:05:35  _____________________________________________________________________  External Attachment:    Type:   Image     Comment:   External Document

## 2010-03-13 NOTE — Assessment & Plan Note (Signed)
Summary: Cardiology Nuclear Study  Nuclear Med Background Indications for Stress Test: Evaluation for Ischemia   History: Echo, Heart Catheterization, Myocardial Perfusion Study, Pacemaker  History Comments: '05 Echo: LVF NL, Moderate LVH. '05 MPI Mid anterior hypoperfusion> Cath: N/O LAD, EF=60%. 5/08 PTVP (SSS/AFIB). 8/10 Last MPI [facility]): EF=67%, (-) ischemia.  Symptoms: Chest Pain, DOE, Fatigue  Symptoms Comments: LE edema.   Nuclear Pre-Procedure Cardiac Risk Factors: Hypertension, NIDDM Caffeine/Decaff Intake: None NPO After: 10:00 PM Lungs: clear IV 0.9% NS with Angio Cath: 20g     IV Site: (R) Forearm IV Started by: Irean Hong RN Chest Size (in) 38     Cup Size D     Height (in): 64 Weight (lb): 173 BMI: 29.80  Nuclear Med Study 1 or 2 day study:  1 day     Stress Test Type:  Adenosine Reading MD:  Dietrich Pates, MD     Referring MD:  Dr. Juanda Chance Resting Radionuclide:  Technetium 23m Tetrofosmin     Resting Radionuclide Dose:  10.8 mCi  Stress Radionuclide:  Technetium 77m Tetrofosmin     Stress Radionuclide Dose:  33 mCi   Stress Protocol  Dose of Adenosine:  44.0 mg    Stress Test Technologist:  Milana Na EMT-P     Nuclear Technologist:  Domenic Polite CNMT  Rest Procedure  Myocardial perfusion imaging was performed at rest 45 minutes following the intravenous administration of Myoview Technetium 24m Tetrofosmin.  Stress Procedure  The patient received IV adenosine at 140 mcg/kg/min for 4 minutes. There were no significant changes with infusion. Myoview was injected at the 2 minute mark and quantitative spect images were obtained after a 45 minute delay.  QPS Raw Data Images:  Soft tissue (diaphragm, breast) surround heart. Stress Images:  distal inferoseptal defect.  Distal anteroseptal defect.  Otherwise normal perfusion. Rest Images:  NO significant change from the stress images. Transient Ischemic Dilatation:  1.11  (Normal <1.22)  Lung/Heart Ratio:  .34  (Normal <0.45)  Quantitative Gated Spect Images QGS EDV:  78 ml QGS ESV:  27 ml QGS EF:  65 % QGS cine images:  Septal and apical hypokinesis.   Overall Impression  Exercise Capacity: Adenosine study with no exercise. BP Response: Normal blood pressure response. Clinical Symptoms: No chest pain ECG Impression: Nondiagnostic due to baseline changes. Overall Impression Comments: Septal thinning consistent with possible scar, cannot exclude some soft tissue attenuation.  No ischemia.  LVEF 65%.  Septal wall motion may be because of conduction delay vs scar.  Appended Document: Cardiology Nuclear Study Probably ok.  Discuss in f/u visit in 2 wks. BB  Appended Document: Cardiology Nuclear [facility].  Appended Document: Cardiology Nuclear Study The pt is aware of her results.

## 2010-03-13 NOTE — Assessment & Plan Note (Signed)
Summary: ROV/PER DR Freida Busman  Medications Added COZAAR 50 MG TABS (LOSARTAN POTASSIUM) 1 tab once daily FUROSEMIDE 20 MG TABS (FUROSEMIDE) 1 tab three times a day * CALICUM 1 tab qd * VIT B 12 daily PRADAXA 150 MG CAPS (DABIGATRAN ETEXILATE MESYLATE) take one tablet by mouth twice a day      Allergies Added: NKDA  Visit Type:  Follow-up Primary Provider:  dr gage in [place]  CC:   pt has no strength- .  History of Present Illness: The patient is 77 years old and return for management of atrial fibrillation and a pacemaker. She has paroxysmal atrial fibrillation and his enrolled in the engage trial. She had some intolerance of her medications and has been switched to Coumadin. She also has a DDD pacemaker in place.  She recently had chest pain and was evaluated with a CT for pulmonary and wasn't which was negative. She also had a Myoview scan on June 6 which was negative.  Other problems include hypertension and GERD.  She says she has not been feeling well over the last several weeks. She has profound fatigue and no energy. She also has developed swelling of her lower extremities. She also complains of unsteadiness of gait with a feeling that she will fall.  Current Medications (verified): 1)  Synthroid 88 Mcg Tabs (Levothyroxine Sodium) .Marland Kitchen.. 1 Ta B Once Daily 2)  Diltiazem Hcl Cr 180 Mg Xr24h-Cap (Diltiazem Hcl) .... Take One Tablet By Mouth Once Daily 3)  Vitamin D3 1000 Unit Caps (Cholecalciferol) .... Take One Tablet By Mouth Once Daily. 4)  Warfarin Sodium 2.5 Mg Tabs (Warfarin Sodium) .... Use As Directed By Anticoagulation Clinic 5)  Cozaar 50 Mg Tabs (Losartan Potassium) .Marland Kitchen.. 1 Tab Once Daily 6)  Furosemide 20 Mg Tabs (Furosemide) .Marland Kitchen.. 1 Tab Two Times A Day 7)  Calicum .Marland Kitchen.. 1 Tab Qd 8)  Vit B 12 .... Daily  Allergies (verified): No Known Drug Allergies  Past History:  Past Medical History: Reviewed history from 01/29/2008 and no changes required.  1. Sick sinus  syndrome, with paroxysmal atrial fibrillation. 2. Status post Medtronic DDD pacemaker implantation. 3. Hypertension. 4. History of mastectomy for breast cancer. 5. Gastroesophageal reflux disease.   Review of Systems       ROS is negative except as outlined in HPI.   Vital Signs:  Patient profile:   77 year old female Height:      64 inches Weight:      176 pounds Pulse rate:   74 / minute BP sitting:   148 / 74  (left arm) Cuff size:   large  Vitals Entered By: Burnett Kanaris, CNA (July 26, 2009 2:09 PM)  Physical Exam  Additional Exam:  Gen. Well-nourished, in no distress   Neck: No JVD, thyroid not enlarged, no carotid bruits Lungs: No tachypnea, clear without rales, rhonchi or wheezes Cardiovascular: Rhythm regular, PMI not displaced,  heart sounds  normal, no murmurs or gallops, no peripheral edema, pulses normal in all 4 extremities. Abdomen: BS normal, abdomen soft and non-tender without masses or organomegaly, no hepatosplenomegaly. MS: No deformities, no cyanosis or clubbing   Neuro:  No focal sns, ataxic gait   Skin:  no lesions    PPM Specifications Following MD:  Everardo Beals. Juanda Chance, MD     PPM Vendor:  Medtronic     PPM Model Number:  ADDR01     PPM Serial Number:  [id number] PPM DOI:  06/11/2006     PPM Implanting  MD:  Everardo Beals Juanda Chance, MD  Lead 1    Location: RA     DOI: 06/11/2006     Model #: 1610     Serial #: [id number]     Status: active Lead 2    Location: RV     DOI: 06/11/2006     Model #: 1914     Serial #: [id number]     Status: active  Magnet Response Rate:  BOL 85 ERI  65  Indications:  Sick sinus syndrome  Explantation Comments:  Carelink  PPM Follow Up Remote Check?  No Battery Voltage:  2.77 V     Battery Est. Longevity:  5 years     Pacer Dependent:  Yes       PPM Device Measurements Atrium  Impedance: 478 ohms, Threshold: 0.5 V at 0.4 msec Right Ventricle  Impedance: 564 ohms, Threshold: 0.625 V at 0.4 msec  Episodes MS Episodes:  218      Percent Mode Switch:  2.3%     Coumadin:  Yes Ventricular High Rate:  1     Atrial Pacing:  90.1%     Ventricular Pacing:  100%  Parameters Mode:  DDDR     Lower Rate Limit:  60     Upper Rate Limit:  105 Paced AV Delay:  160     Sensed AV Delay:  140 Tech Comments:  No parameter changes.  Device function normal.  1VHR episode 6 seconds.  ROV 6 months with Dr. Juanda Chance. Altha Harm, LPN  July 26, 2009 2:11 PM   Impression & Recommendations:  Problem # 1:  FATIGUE / MALAISE (ICD-780.79) She has generalized symptoms of fatigue and no energy. The etiology is not clear. He does have evidence of mild volume overload and probable diastolic CHF and this could contribute. We will get the laboratory studies from Dr. Martha Clan office. We will get a 2-D echocardiogram to evaluate her LV systolic and diastolic function.  Problem # 2:  ATRIAL FIBRILLATION (ICD-427.31)  She has atrial fibrillation which is paroxysmal. She has had 2.3% of her time in nature fibrillation since her last count. She would like to switch from Coumadin to Pradaxa.  We'll start that. We will have her hold the Coumadin for 4 days and then start the Pradaxa. Her updated medication list for this problem includes:    Warfarin Sodium 2.5 Mg Tabs (Warfarin sodium) ..... Use as directed by anticoagulation clinic  Her updated medication list for this problem includes:    Warfarin Sodium 2.5 Mg Tabs (Warfarin sodium) ..... Use as directed by anticoagulation clinic  Problem # 3:  CHEST PAIN-PAINFUL RESPIRATIONS (ICD-786.52)  Her chest pain has resolved. She had a negative Myoview scan. There is no evidence of ischemia. Her updated medication list for this problem includes:    Diltiazem Hcl Cr 180 Mg Xr24h-cap (Diltiazem hcl) .Marland Kitchen... Take one tablet by mouth once daily    Warfarin Sodium 2.5 Mg Tabs (Warfarin sodium) ..... Use as directed by anticoagulation clinic  Her updated medication list for this problem includes:    Diltiazem Hcl  Cr 180 Mg Xr24h-cap (Diltiazem hcl) .Marland Kitchen... Take one tablet by mouth once daily    Warfarin Sodium 2.5 Mg Tabs (Warfarin sodium) ..... Use as directed by anticoagulation clinic  Orders: Echocardiogram (Echo)  Problem # 4:  PACEMAKER (ICD-V45.Marland Kitchen01) We interrogated her pacemaker today. She notes which is 2.3% of the time. Most of the time she is atrial pacing and ventricular pacing. Her pacer  function was good.  Problem # 5:  ABNORMALITY OF GAIT (ICD-781.2) She has symptoms of unsteadiness and tendency to fall. On examination she has an ataxic gait. We will plan further evaluation with neurology.  Other Orders: Neurology Referral (Neuro)  Patient Instructions: 1)  Your physician recommends that you schedule a follow-up appointment in: 6 weeks with Dr. Juanda Chance 2)  Your physician has recommended you make the following change in your medication: Furosemide 20 mg tablet Take 3 tablets daily. Pradaxa 150 mg take one tablet by mouth  twice a day. Start medication 4 days after stoping Coumodin. 3)  Your physician has requested that you have an echocardiogram.  Echocardiography is a painless test that uses sound waves to create images of your heart. It provides your doctor with information about the size and shape of your heart and how well your heart's chambers and valves are working.  This procedure takes approximately one hour. There are no restrictions for this procedure. 4)  You have been referred to neurology Dx. Ataxia Prescriptions: PRADAXA 150 MG CAPS (DABIGATRAN ETEXILATE MESYLATE) take one tablet by mouth twice a day  #180 x 4   Entered by:   Ollen Gross, RN, BSN   Authorized by:   Lenoria Farrier, MD, [facility]   Signed by:   Ollen Gross, RN, BSN on 07/26/2009   Method used:   Electronically to        [facility]* (mail-order)       [address]       [place], [place]  [address]       Ph: [phone]       Fax: [phone]   RxID:   [id number] FUROSEMIDE 20  MG TABS (FUROSEMIDE) 1 tab three times a day  #270 x 4   Entered by:   Ollen Gross, RN, BSN   Authorized by:   Lenoria Farrier, MD, [facility]   Signed by:   Ollen Gross, RN, BSN on 07/26/2009   Method used:   Electronically to        [facility]* (mail-order)       [address], [place]  [address]       Ph: [phone]       Fax: [phone]   RxID:   [id number]

## 2010-03-13 NOTE — Procedures (Signed)
Summary: Cardiology Device Clinic    Allergies: No Known Drug Allergies  PPM Specifications Following MD:  Everardo Beals. Juanda Chance, MD     PPM Vendor:  Medtronic     PPM Model Number:  ADDR01     PPM Serial Number:  [id number] PPM DOI:  06/11/2006     PPM Implanting MD:  Everardo Beals. Juanda Chance, MD  Lead 1    Location: RA     DOI: 06/11/2006     Model #: 4098     Serial #: [id number]     Status: active Lead 2    Location: RV     DOI: 06/11/2006     Model #: 6213     Serial #: [id number]     Status: active  Magnet Response Rate:  BOL 85 ERI  65  Indications:  Sick sinus syndrome  Explantation Comments:  Carelink  PPM Follow Up Battery Voltage:  2.77 V     Battery Est. Longevity:  4.55yrs     Pacer Dependent:  Yes       PPM Device Measurements Atrium  Amplitude: 2.00 mV, Impedance: 529 ohms, Threshold: 0.50 V at 0.40 msec Right Ventricle  Amplitude: 31.36 mV, Impedance: 561 ohms, Threshold: 0.750 V at 0.40 msec  Episodes MS Episodes:  214     Percent Mode Switch:  11.6%     Coumadin:  Yes Ventricular High Rate:  0     Atrial Pacing:  85.5%     Ventricular Pacing:  99.7%  Parameters Mode:  DDDR     Lower Rate Limit:  60     Upper Rate Limit:  105 Paced AV Delay:  160     Sensed AV Delay:  140 Next Cardiology Appt Due:  06/10/2010 Tech Comments:  PT IN AF 11.6% OF TIME. + COUMADIN.  NORMAL DEVICE FUNCTION.  CHANGED RA OUTPUT FROM 1.5 TO 2.00 AND RV OUTPUT FROM 2.00 TO 2.5 V.  ROV IN 6 MTHS W/JA. Vella Kohler  December 30, 2009 5:40 PM

## 2010-03-19 NOTE — Progress Notes (Signed)
Summary: procedure- come off praxada 2 days   Phone Note From Other Clinic   Caller: roger [facility]  [facility] [phone] Request: Talk with Nurse Summary of Call: pt having surgery- pt advise regarding holding praxada for 2 days.  Initial call taken by: Lorne Skeens,  March 10, 2010 10:28 AM  Follow-up for Phone Call        spoke with PA he will let pt know  ok to hold 3 days prior and restart 36 hours after per Dr Johney Frame Dennis Bast, RN, BSN  March 10, 2010 11:05 AM

## 2010-04-02 NOTE — Procedures (Signed)
Summary: Cardiology Device Clinic    Allergies: No Known Drug Allergies  PPM Specifications Following MD:  Hillis Range, MD     PPM Vendor:  Medtronic     PPM Model Number:  ADDR01     PPM Serial Number:  [id number] PPM DOI:  06/11/2006     PPM Implanting MD:  Everardo Beals. Juanda Chance, MD  Lead 1    Location: RA     DOI: 06/11/2006     Model #: 9147     Serial #: [id number]     Status: active Lead 2    Location: RV     DOI: 06/11/2006     Model #: 6578     Serial #: [id number]     Status: active  Magnet Response Rate:  BOL 85 ERI  65  Indications:  Sick sinus syndrome  Explantation Comments:  Carelink  PPM Follow Up Battery Voltage:  2.77 V     Battery Est. Longevity:  4 YRS     Pacer Dependent:  Yes       PPM Device Measurements Atrium  Amplitude: 0.70 mV, Impedance: 492 ohms,  Right Ventricle  Amplitude: 31.36 mV, Impedance: 511 ohms, Threshold: 0.50 V at 0.40 msec  Episodes MS Episodes:  67     Percent Mode Switch:  14.1%     Coumadin:  Yes Ventricular High Rate:  0     Atrial Pacing:  94.4%     Ventricular Pacing:  99.9%  Parameters Mode:  DDDR     Lower Rate Limit:  60     Upper Rate Limit:  105 Paced AV Delay:  160     Sensed AV Delay:  140 Next Cardiology Appt Due:  06/10/2010 Tech Comments:  PT SEEING [facility]OTT FOR PAIN AT [facility].  67 MODE SWITCHES--14.1% OF TIME. + PRADAXA.  NORMAL DEVICE FUNCTION.  NO CHANGES MADE. ROV IN MAY W/JA. Vella Kohler  March 16, 2010 12:18 PM

## 2010-04-02 NOTE — Cardiovascular Report (Signed)
Summary: Office Visit Remote   Office Visit Remote   Imported By: Roderic Ovens 03/25/2010 14:36:12  _____________________________________________________________________  External Attachment:    Type:   Image     Comment:   External Document

## 2010-05-14 LAB — PROTIME-INR
INR: 1.06 (ref 0.00–1.49)
Prothrombin Time: 13.7 seconds (ref 11.6–15.2)

## 2010-06-24 NOTE — Assessment & Plan Note (Signed)
[facility]                         ELECTROPHYSIOLOGY OFFICE NOTE   TRISTY, UDOVICH                      MRN:          [id number]  DATE:06/23/2006                            DOB:          04-23-1921    REASON FOR VISIT:  Ms. Bowden was seen in the device clinic today for  followup of her newly implanted Medtronic Adapta pacemaker.  Her device  was implanted on Jun 11, 2006 for sick sinus syndrome.   OBJECTIVE:  Interrogation of her device demonstrates P waves of 2 to 2.8  millivolts and atrial impedence of 540 ohms with an atrial threshold of  0.5 volts at 0.4 milliseconds.  Her R waves were greater than 15.68  millivolts with an impedence of 669 ohms and a threshold of 0.75 volts  at 0.4 milliseconds.  Her battery voltage was 2.79 volts and she is not  pacemaker dependent.  Ms. Cipriani did have 46 mode switch episodes which  represented 2.3% of her total monitored time, the longest lasting one  hour.  She is in the RELY study and is taking that medication.  She is  programmed DDDR with a low rate of 60 and an upper rate of 130.  Her  paced and sensed AV delays are both 300 milliseconds.  She is V paced  99% of the time.  Her wound was checked today and her Steri-strips were  removed.  The wound was without redness or swelling.   PLAN:  She has an appointment on Jun 29, 2006 to return to clinic to see  Dr. Juanda Chance.      Gypsy Balsam, RN,BSN       Bruce R. Juanda Chance, MD, [facility]    AS/MedQ  DD: 06/23/2006  DT: 06/23/2006  Job #: [id number]

## 2010-06-24 NOTE — Assessment & Plan Note (Signed)
[facility]                            CARDIOLOGY OFFICE NOTE   [name], [name]                      MRN:          [id number]  DATE:06/23/2007                            DOB:          [birth year]    PRIMARY CARE PHYSICIAN:  Dr. Renae Fickle in [place].   CLINICAL HISTORY:  [name] is 77 years old and participates in the  Senior Games in [place] and won many medals in her county this  past spring.  She has sick sinus syndrome, with paroxysmal atrial  fibrillation, and has a Medtronic dual-chamber pacemaker in place.  She  returned for follow-up management of this.  She says she had been doing  better with less palpitations.  She does complain of some dizziness, and  she wonders if this is related to the Coumadin.  She had no chest pain  or shortness of breath.   PAST MEDICAL HISTORY:  1. Hypertension.  2. GERD.  3. Previous breast cancer, for which she has had a mastectomy.   CURRENT MEDICATIONS:  1. Micardis 40 mg daily.  2. Synthroid.  3. Calcium.  4. Warfarin.  5. Diltiazem CD 120 mg daily.  6. Aricept.  7. Multivitamins.   PHYSICAL EXAMINATION:  VITAL SIGNS:  The blood pressure is 139/75, and  the pulse 81 and regular.  NECK:  There is no venous tension.  The carotid pulses were full,  without bruits.  CHEST:  Clear.  CARDIAC:  Rhythm was regular.  I heard no murmurs or gallops.  ABDOMEN:  Soft, with normal bowel sounds.  EXTREMITIES:  Peripheral pulses were full.  There was no peripheral  edema.   IMPRESSION:  1. Sick sinus syndrome, with paroxysmal atrial fibrillation.  2. Status post Medtronic DDD pacemaker implantation.  3. Hypertension.  4. History of mastectomy for breast cancer.  5. Gastroesophageal reflux disease.   RECOMMENDATIONS:  I think [name] is doing well.  She believes the  warfarin is causing her some side effects and would like to switch to  Coumadin.  I do not see any downside with this, and will  plan to make  this switch to see if this will help.  I recommended she have a  followup.  I will leave her on the same dose that she is on, which is 5  mg a day, but I recommended that she have a follow-up protime with Dr.  Samuel Germany in 2 weeks.  I will plan to see her back in followup here in 6  months.     Bruce Elvera Lennox Juanda Chance, MD, [facility]  Electronically Signed    BRB/MedQ  DD: 06/23/2007  DT: 06/23/2007  Job #: [id number]   cc:   Renae Fickle

## 2010-06-24 NOTE — Assessment & Plan Note (Signed)
[facility]                            CARDIOLOGY OFFICE NOTE   DALANEY, NEEDLE                      MRN:          [id number]  DATE:06/29/2006                            DOB:          February 12, 1921    PRIMARY CARE PHYSICIAN:  Dr. Renae Fickle   CLINICAL COURSE:  Ms. Villacres is 77 year old and returns for a followup  after her recent pacemaker implantation.  She has had a sick sinus  __________ with atrial fibrillation an marked bradycardia and dizziness  and we finally decided to go ahead and put in a permanent pacemaker.  She had a Medtronic adapted dual pacemaker implanted.  She has done fine  since that time and has had no problems with presyncope or dizziness and  the pacer site is healed well.   PAST MEDICAL HISTORY:  Is significant for hypertension, GERD, previous  mastectomy.   CURRENT MEDICATIONS:  Include Micardis, Synthroid, RELY study drug,  calcium and aspirin.   EXAMINATION:  Blood pressure 146/76 and the pulse 86 and regular.  There was no venous distention.  The carotid pulses were full without  bruits.  CHEST:  Was clear.  CARDIAC:  Rhythm was regular.  Heard no murmurs or gallops.  ABDOMEN:  Is soft, normal bowel sounds.  Peripheral pulses are full with no peripheral edema.   IMPRESSION:  1. Sick sinus syndrome with paroxysmal atrial fibrillation and      bradycardia.  2. Status post recent Medtronic __________ Adapta pacemaker      implantation.  3. Hypertension.  4. RELY study.  5. Gastroesophageal reflux disease.  6. History of mastectomy for breast cancer.   RECOMMENDATIONS:  I think Ms. Flippin is doing well. I told her that  middle of June she could go participate in senior games including 3 on 3  basketball.  Will plan to see her back in 6 months.  I will not make any  changes in her medications.     Bruce Elvera Lennox Juanda Chance, MD, [facility]  Electronically Signed    BRB/MedQ  DD: 06/29/2006  DT: 06/29/2006  Job #: [id number]

## 2010-06-24 NOTE — Assessment & Plan Note (Signed)
[facility]                            CARDIOLOGY OFFICE NOTE   [name], [name]                      MRN:          [id number]  DATE:12/21/2006                            DOB:          09-Aug-1921    PRIMARY CARE PHYSICIAN:  Dr. Renae Fickle in [place].   CLINICAL HISTORY:  [name] is 77 years old and is a regular  participate in the Senior Games.  She won 67 county metals and 4 state  metals this year.  She also has sick sinus syndrome with paroxysmal  atrial fibrillation and she has undergone implantation of a Medtronic  dual-chamber pacemaker.   She said she had done fairly well recently.  She has had some times  where she felt her heat beating fast.   PAST MEDICAL HISTORY:  1. Hypertension.  2. GERD.   CURRENT MEDICATIONS:  Micardis, Synthroid, Rely study drug, calcium,  aspirin and Niacin.   PHYSICAL EXAMINATION:  Her blood pressure is 132/74 and the pulse 87 and  regular.  There was no venous distention. The carotid pulses were full without  bruits.  CHEST:  Clear.  CARDIAC:  Rhythm was regular.  There were no murmurs or gallops.  ABDOMEN:  Soft with normal bowel sounds.  There was no  hepatosplenomegaly.  EXTREMITIES:  Peripheral pulses were full and there was no pitting  edema.   ECG:  Shows pacing in both chambers with a long AV delay.   IMPRESSION:  1. Sick sinus syndrome with paroxysmal atrial fibrillation.  2. Status post Medtronic DDD pacemaker implantation.  3. Hypertension.  4. Rely Study.  5. Gastroesophageal reflux disease.  6. History of mastectomy for breast cancer.   RECOMMENDATIONS:  I think [name] is doing fairly well.  She is having  some episodes of feeling tachycardia and she also has a very long AV  delay with no intrinsic visualized on her  ECG, so I will not interrogate her pacemaker to evaluate these issues.  I will plan to see her back in 6 months on the anniversary of her pacer.     Bruce [name]  Dominique Chance, MD, [facility]  Electronically Signed    BRB/MedQ  DD: 12/21/2006  DT: 12/22/2006  Job #: [id number]

## 2010-06-24 NOTE — Assessment & Plan Note (Signed)
[facility]                            CARDIOLOGY OFFICE NOTE   [name], [name]                      MRN:          [id number]  DATE:01/31/2008                            DOB:          06-Oct-1921    PRIMARY CARE PHYSICIAN:  Dr. Renae Fickle in [place], [facility].   CLINICAL HISTORY:  [name] is 77 years old who returned for a followup  management of her atrial fibrillation and pacemaker.  She has paroxysmal  atrial fibrillation and she has a Medtronic DDD pacemakers implanted in  2006.  She states she has done well recently with no recent chest pain,  shortness of breath, or palpitations.  Her biggest complaint is related  to swelling on her lower extremities, which has been present over the  last several weeks.   PAST MEDICAL HISTORY:  Significant for hypertension and GERD and a  previous mastectomy for breast cancer.   CURRENT MEDICATIONS:  Synthroid, warfarin, diltiazem, multivitamins, and  Benicar.   SOCIAL HISTORY:  [name] participates in the senior game and is quite  active.  Her twin sister, Gildardo Cranker, is also a patient of mine.   PHYSICAL EXAMINATION:  VITAL SIGNS:  Blood pressure is 124/70 and the  pulse is 80 and regular.  NECK:  There was no venous distension.  Carotid pulses were full without  bruits.  CHEST:  Clear.  CARDIAC:  Rhythm was regular.  I could hear no murmurs or gallops.  ABDOMEN:  Soft, normal bowel sounds.  EXTREMITIES:  There was 1+ peripheral edema and the pedal pulses were  equal.   Interrogation of pacemaker showed good thresholds on both channels.  She  had atrial fibrillation about 3.2% of the time.  She paces both chambers  all most all the time.   IMPRESSION:  1. Sick sinus syndrome with paroxysmal atrial fibrillation, currently      treated with Coumadin and rate control medications.  2. Status post Medtronic Adapta dual-mode, dual-pacing, dual-sensing      pacemaker  with good pacer function.  3. Hypertension.  4. History of mastectomy for breast cancer.  5. Gastroesophageal reflux disease.  6. Peripheral edema, probably related to venous insufficiency.   RECOMMENDATIONS:  I think [name] is doing well.  Her main problem is  related to the swelling in her legs.  We will plan to put her on  hydrochlorothiazide 25 a day and get a BMP in a week.  We will also  prescribe support hose and I told her to keep her legs elevated as much  as  possible.  We will plan to see her back in 4 months.  She was on the  cover of our cardia newsletter recently.     Bruce Elvera Lennox Juanda Chance, MD, [facility]  Electronically Signed    BRB/MedQ  DD: 01/31/2008  DT: 02/01/2008  Job #: [id number]

## 2010-06-24 NOTE — Assessment & Plan Note (Signed)
[facility]                        [place] CARDIOLOGY OFFICE NOTE   LOREY, PALLETT                      MRN:          [id number]  DATE:07/11/2009                            DOB:          07/04/1921    PROBLEM LIST:  1. Sick sinus syndrome and atrial fibrillation, status post Medtronic      pacemaker.  2. Hypertension.  3. Diabetes.  4. Hypothyroidism.   INTERVAL HISTORY:  The patient reports today with an episode of chest  discomfort last night.  The patient states that while in bed, she  experienced acute onset of left-sided chest and shoulder discomfort.  There were some shooting pain into the upper portion of her left arm as  well.  There were no associated symptoms and the pain seemed to be  exacerbated by moving her left arm.  She fell asleep at the discomfort  in this morning.  She states that it was still there a little bit.  Throughout the day today, the patient states that she has been somewhat  anxious regarding the discomfort and that may have persisted a little  bit throughout the day.  The patient endorses problems with diffuse  arthritis and believes that may be the etiology of the chest discomfort.  Of note, she had a Lexiscan sestamibi in August 2010 that showed an  ejection fraction of 67% and no evidence of ischemia.   REVIEW OF SYSTEMS:  Positive for new-onset lower extremity edema for the  past month and increased dyspnea on exertion and fatigue for the past  several months ever since she was diagnosed with the flu.   PHYSICAL EXAMINATION:  VITAL SIGNS:  Today, blood pressure is 134/76,  pulse 78, saturating 97% on room air.  She weighs 174 pounds.  GENERAL:  No acute distress.  HEENT:  Normocephalic, atraumatic.  NECK:  Supple.  There is no JVD.  HEART:  Regular rate and rhythm without murmur, rub, or gallop.  LUNGS:  Clear bilaterally.  ABDOMEN:  Soft, nontender, nondistended.  EXTREMITIES:  1+ bilateral lower  extremity edema.  SKIN:  Warm and dry.   EKG taken today in clinic shows AV sequential pacing at a rate of 83  beats per minute.   ASSESSMENT:  An 77 year old female with diabetes, diet-controlled  diabetes, and hypertension, presenting with atypical chest discomfort.  As the pain is exacerbated with movement of the left upper extremity,  this is likely musculoskeletal in origin.  However, the increased  dyspnea on exertion and new-onset edema for the past month is  concerning.   PLAN:  Today, we will check a series labs including a BNP and troponin.  She will be scheduled for an outpatient stress test.  Lastly, she has  Lasix which she has been taking p.r.n. at home  and we recommend that she take 40 mg daily.  She will follow up with Dr.  Juanda Chance in 1-2 weeks' time.  The patient is told that if the chest  discomfort returns and is not self-limiting that she should contact our  office or be taken to the emergency room for evaluation.  Brayton El, MD  Electronically Signed    SGA/MedQ  DD: 07/11/2009  DT: 07/12/2009  Job #: [id number]
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
## 2010-06-24 NOTE — Assessment & Plan Note (Signed)
[facility]                            CARDIOLOGY OFFICE NOTE   CLATIE, KESSEN                      MRN:          [id number]  DATE:10/11/2007                            DOB:          07/16/1921    ADDENDUM  We interrogated Ms. Alphin's pacemaker and she had a number of mode  switches, the longest switch lasted 8 hours and 45 minutes.  The average  ventricular rates during these mode switches were generally in the 70s  to 80s with some in the 90s.  Her total mode switch time was 4.7%.  Her  underlying rhythm is quite slow with rates in the 40s and returned pacer  down, so that she appears to be well blocked.  She did have some paced  rhythm up in the 100s and her maximum sensor rate was 115 and maximum  tracking rate was 115, so we turned both of those down to 105.  We do  not have the strips when she was at [facility] Emergency Room, but she  said her heart rate was about 116.  It is possible that she had an  intrinsic atrial fibrillation at that rate, although study from  interrogation does not look like she would be that fast.  It is possible  that she had some stimulation of her rate response and she may be may  have gotten up to the 115 maximum sensory rate.  The other alternative  would be that she might have some supraventricular arrhythmias that are  not fast enough to trigger the mode switch and she was tracking at upper  rate limit.  Cutting that back to 105 should solve that problem.  We  will plan to see her back in 4 months.  She is to call if she has  additional problems.     Bruce Elvera Lennox Juanda Chance, MD, [facility]     BRB/MedQ  DD: 10/11/2007  DT: 10/12/2007  Job #: [id number]

## 2010-06-24 NOTE — Discharge Summary (Signed)
[name], [name]               ACCOUNT NO.:  [id number]   MEDICAL RECORD NO.:  [id number]          PATIENT TYPE:  INP   LOCATION:  2010                         FACILITY:  [facility]   PHYSICIAN:  Doylene Canning. Ladona Ridgel, MD    DATE OF BIRTH:  September 18, 1921   DATE OF ADMISSION:  06/10/2006  DATE OF DISCHARGE:  06/12/2006                         DISCHARGE SUMMARY - REFERRING   DISCHARGE DIAGNOSES:  1. Near syncope.  2. Status post Medtronic Adapta dual-chamber pacemaker on Jun 11, 2006      by Dr. Juanda Chance.  3. Hyperglycemia with a history of diet-controlled diabetes.  4. History as previously.   PROCEDURE PERFORMED:  Medtronic Adapta dual-chamber pacemaker via the  left subclavian vein on Jun 11, 2006 by Dr. Juanda Chance.   PRIMARY CARE PHYSICIAN:  Dr. Renae Fickle.   SUMMARY OF HISTORY:  [name] is an 77 year old white female who  provides a history of persistent vertigo and staggering gait.  She  recently saw Dr. Juanda Chance in the clinic, and arrangements were made for  outpatient pacemaker for Jun 11, 2006.  She also has a history of  paroxysmal atrial fibrillation and sick sinus syndrome with heart rates  in the 30s and 40s.  She presented to the emergency room on the evening  of admission because she felt like she was going to pass out.   Her past medical history is notable for diet-controlled diabetes,  hypertension, hypothyroidism, PAF with sick sinus syndrome.   LABORATORY DATA:  Chest x-ray on the 2nd showed cardiomegaly,  hyperinflation, lingular scar.  No acute findings.  Chest x-ray on the  3rd showed permanent pacer placement, no active disease.   Admission weight was 81 kg.  Discharge weight 79.7 kg.   Admission H&H was 13.3 and 38.3, normal indices, platelets 165, WBCs  5.8.  PTT 48, PT 14.4, sodium 137, potassium 3.9, BUN 12, creatinine  0.83, glucose 164.  BNP 155.  Point-of-care markers negative x1.  Urinalysis unremarkable.   Initial EKG showed sinus bradycardia with a ventricular  rate of 46,  normal axis, nonspecific ST-T wave changes.  A 10-point EKG post pacer  showed AV pacing.   HOSPITAL COURSE:  [name] was admitted to [facility] to unit  2000.  Medications were continued.  Study drug was held in anticipation  of permanent pacer placement on Jun 11, 2006.  On Jun 11, 2006, Dr. Juanda Chance  inserted a Medtronic Adapta dual-chamber pacer via the left subclavian  vein without difficulty.  Pacemaker check was performed, and this showed  normal parameters.  Dr. Ladona Ridgel, after reviewing x-ray, felt that patient  could be discharged home.  Dr. Juanda Chance wishes to see the patient in three  weeks.   DISPOSITION:  Patient is discharged home.  She is asked to continue her  low sodium, heart-healthy ADA diet.  Her wound care and activities are  documented per the supplemental discharge instructions for pacer  patients.  She was asked to continue her aspirin 81 mg daily, RELY study  drugs, Synthroid 88 mcg daily, Micardis 40 mg daily.   Our office will call her  with an appointment for a 10-day wound care  check and a three week appointment with Dr. Juanda Chance.   DISCHARGE TIME:  Less than 30 minutes.      Joellyn Rued, PA-C      Doylene Canning. Ladona Ridgel, MD  Electronically Signed    EW/MEDQ  D:  06/12/2006  T:  06/12/2006  Job:  [id number]   cc:   Everardo Beals. Juanda Chance, MD, [facility]  Renae Fickle

## 2010-06-24 NOTE — Assessment & Plan Note (Signed)
[facility]                            CARDIOLOGY OFFICE NOTE   [name], [name]                      MRN:          [id number]  DATE:10/11/2007                            DOB:          1922-02-04    PRIMARY CARE PHYSICIAN:  Dr. Renae Fickle in [place].   CLINICAL HISTORY:  [name] is an 77 year old and returns for followup  management of her sick sinus syndrome, paroxysmal atrial fibrillation,  and pacemaker.  She had a Medtronic DDD pacemaker implanted on Jun 11, 2006, for sick sinus syndrome with paroxysmal atrial fibrillation.  She  has done fairly well since that time.  Two weeks ago, she was seen in  [facility] Emergency Room with a fast heartbeat.  I do not think they made  any changes in her medications.  She had previously been on RELY study  but now on Coumadin.   PAST MEDICAL HISTORY:  Significant for hypertension, GERD, and she has  had a mastectomy for breast cancer.   CURRENT MEDICATIONS:  Micardis, Synthroid, calcium, warfarin, Diltiazem  CD 120, and gabapentin.   PHYSICAL EXAMINATION:  Blood pressure is 146/82 and pulse 67 and  regular.  There is no venous distention.  The carotid pulses were full  without bruits.  Chest was clear.  Cardiac rhythm was regular.  I could  hear no murmurs or gallops.  Abdomen was soft with normal bowel sounds.  There was trace peripheral edema.   IMPRESSION:  1. Sick sinus syndrome with paroxysmal atrial fibrillation, currently      treated with Coumadin and rate control medications.  2. Status post Medtronic dual-mode, dual-pacing, dual-sensing      pacemaker implantation.  3. Hypertension.  4. History of mastectomy for breast cancer.  5. Gastroesophageal reflux disease.   RECOMMENDATIONS:  Today, [name] is doing well.  We will interrogate  her pacemaker and see what count of arrhythmia she has had since her  last visit.  I will decide after that if we should make any adjustments  in her  medications.  I will plan to see her back in 6 months.   ADDENDUM:  [name] medicines were not changed after her visit to  [facility] Emergency Room.   ADDENDUM:  We interrogated [name]'s pacemaker and she had a number of  mode switches, the longest switch lasted 8 hours and 45 minutes.  The  average ventricular rates during these mode switches were generally in  the 70s to 80s with some in the 90s.  Her total mode switch time was  4.7%.  Her underlying rhythm is quite slow with rates in the 40s and  returned pacer down, so that she appears to be well blocked.  She did  have some paced rhythm up in the 100s and her maximum sensor rate was  115 and maximum tracking rate was 115, so we turned both of those down  to 105.  We do not have the strips when she was at [facility] Emergency  Room, but she said her heart rate was about 116.  It is possible that  she had an intrinsic atrial fibrillation at that rate, although study  from interrogation does not look like she would be that fast.  It is  possible that she had some stimulation of her rate response and she may  be may have gotten up to the 115 maximum sensory rate.  The other  alternative would be that she might have some supraventricular  arrhythmias that are not fast enough to trigger the mode switch and she  was tracking at upper rate limit.  Cutting that back to 105 should solve  that problem.  We will plan to see her back in 4 months.  She is to call  if she has additional problems.     Bruce Elvera Lennox Juanda Chance, MD, [facility]  Electronically Signed    BRB/MedQ  DD: 10/11/2007  DT: 10/12/2007  Job #: [id number]

## 2010-06-24 NOTE — Op Note (Signed)
[name], [name]               ACCOUNT NO.:  [id number]   MEDICAL RECORD NO.:  [id number]          PATIENT TYPE:  INP   LOCATION:  2010                         FACILITY:  [facility]   PHYSICIAN:  Everardo Beals. Juanda Chance, MD, FACCDATE OF BIRTH:  February 16, 1921   DATE OF PROCEDURE:  06/11/2006  DATE OF DISCHARGE:                               OPERATIVE REPORT   ELECTROPHYSIOLOGIST:  Everardo Beals. Juanda Chance, MD, [facility].   PROCEDURE:  Implantation of a Medtronic DV pacemaker (model ADDR1,  serial [id number]), with a Medtronic bipolar screw-in ventricular lead  (model #5076-52 cm, serial #[id number]), and a Medtronic screw-in  bipolar atrial lead (model #5076-45 cm, serial #[id number]).   ANESTHESIA:  Local 1% Xylocaine.   ESTIMATED BLOOD LOSS:  Less than 10 mL.   COMPLICATIONS:  None.   CLINICAL HISTORY:  The patient is 77 years old and is a participant in  the [employer].  She has sick sinus syndrome with bradycardia and  paroxysmal atrial fibrillation.  Recently, she has had increased  bradycardia and increased symptoms, and a decision to proceed with  implantation of a permanent pacemaker.  She also has had some unsteady  gait and possible posterior circulation neurologic ischemia, and she has  been seen by neurology.  This evaluation has not been completed, but she  did have an MR done.  She also is on the Life Study drug, and this was  stopped 2 days before this procedure.   PROCEDURE:  The procedure was performed in laboratory room #1.  The left  anterior chest was prepped and draped in the usual fashion.  A venogram  was done to locate the subclavian vein for access.  The skin and  subcutaneous tissue were anesthetized with 1% local Xylocaine.  Using an  18-gauge, thin-walled needle, the subclavian vein was entered, and  access was secured with a 0.038 wire.  An incision was made below the  clavicle and extended to the prepectoral fascia.  A pocket was made  inferior to the incision using  blunt dissection.  Using two 9-French  sheaths, the atrial and ventricular leads were positioned in the right  atrium.  The 0.038 wire was left in the subclavian vein for later  access.  A figure-of-eight suture was placed at the entry site with 1-0  silk.  The ventricular lead was positioned in the right ventricular  apex, with good pacing perimeters, described below.  The atrial lead was  positioned in the right atrial appendage with good pacing perimeters,  described below.  After removal of the stylets and the 0.038 wire, the  figure-of-eight suture was secured at the entry site.  The leads were  attached to the posterior aspect of the pocket with 2 sutures of 1-0  silk around each Silastic collar.  The pocket was irrigated with sterile  kanamycin solution.  The leads were attached to the generator, and the  generator was implanted into the pocket and secured loosely to the  prepectoral fascia with 1-0 silk.  The subcutaneous tissue was closed  with running 2-0 Vicryl.  The skin was closed with running  4-0 Vicryl.   PACING PERIMETERS:  Atrium:  P wave 1.2 mV.  Minimal threshold of  capture at 1.2 V at a pulse width of 0.5.  Impedance 500 ohms.   Ventricle:  R wave 25 mV.  Minimal threshold of capture at 0.8 V at a  pulse width of 0.5 msec.  Resistance 930 ohms.  There was no pacing of  the diaphragm on either lead at 10 V.   The patient tolerated the procedure well and left the laboratory pacing  both chambers.      Bruce Elvera Lennox Juanda Chance, MD, [facility]  Electronically Signed     BRB/MEDQ  D:  06/11/2006  T:  06/11/2006  Job:  [id number]   cc:   Renae Fickle  Cardiopulmonary Lab

## 2010-06-24 NOTE — Assessment & Plan Note (Signed)
[facility]                            CARDIOLOGY OFFICE NOTE   [name], [name]                      MRN:          [id number]  DATE:04/15/2007                            DOB:          04-09-1921    REASON FOR VISIT:  The patient is a very pleasant 77 year old female,  patient of Dr. Charlies Constable, who has a history of sick sinus syndrome  status post Medtronic pacemaker as well as paroxysmal atrial  fibrillation.  She also has hypertension.  Of note she did undergo  cardiac catheterization in April 2005.  At that time she had  nonobstructive coronary disease.  Her LV function has been preserved in  the past.   The patient states that last P.M. she developed a sharp pain near her  pacemaker.  It lasted for 1-2 seconds and when she rolled over it went  away.  This morning, however, when she woke up she felt more fatigued  than normal.  She also has increased dyspnea on exertion, but there has  been no orthopnea, PND, pedal edema, palpitations, or syncope.  She has  chronic dizziness.  Because of the above she presents for further  evaluation.   MEDICATIONS:  The patient's medications include:  1. Micardis 40 mg by mouth every day.  2. Synthroid 88 mcg by mouth every day.  3. Calcium.  4. Coumadin.   PHYSICAL EXAMINATION:  VITAL SIGNS:  Her physical exam today shows a  blood pressure of 153/87 and her pulse is 85.  She weighs 174 pounds.  HEENT:  The head, eyes, ears, nose and throat are normal.  NECK:  The neck is supple.  CHEST:  The chest is clear.  HEART:  Cardiovascular exam reveals a regular rate and rhythm.  ABDOMEN:  The abdominal exam shows no tenderness.  EXTREMITIES:  The extremities show no edema.   LABORATORY DATA:  The patient's electrocardiogram shows ventricular  pacing.  There appears to be an ectopic atrial rhythm versus a slow  atrial flutter, which is new compared to previously.  We did interrogate  her pacemaker and the  function was normal, and it confirmed that there  was probable atrial arrhythmia.   DIAGNOSES:  1. Probable atrial flutter versus ectopic atrial tachycardia.  Her      pacemaker was reprogrammed to DDD-IR.  We also will add Cardizem      120 mg by mouth daily.  This should help with her symptoms.  Note,      she is already on Coumadin.  I will have her follow up with Dr.      Juanda Chance next week for further evaluation.  2. Status post pacemaker placement.  3. History of paroxysmal atrial fibrillation.  4. Hypertension.  Her blood pressure is mildly elevated, but we are      adding Cardizem as well.  5. Gastroesophageal reflux disease.  6. History of mastectomy secondary to breast cancer.     Madolyn Frieze Jens Som, MD, [facility]  Electronically Signed    BSC/MedQ  DD: 04/15/2007  DT: 04/17/2007  Job #: [id number]

## 2010-06-24 NOTE — Assessment & Plan Note (Signed)
[facility]                            CARDIOLOGY OFFICE NOTE   Dominique, [name]                      MRN:          [id number]  DATE:04/20/2007                            DOB:          February 25, 1921    PRIMARY CARDIOLOGIST:  Everardo Beals. Juanda Chance, MD, [facility].   This is a very pleasant 77 year old white female patient of Dr. Juanda Chance  who has a history of pacemaker and paroxysmal atrial fibrillation.  She  saw Dr. Jens Som last week for what he thought was probable atrial  flutter versus ectopic atrial tachycardia.  Her pacemaker was  reprogrammed to DDIR, and Cardizem 120 mg was added.  Since that time,  she does not feel much better.  She says her presenting symptoms to him  was sharp, shooting chest pain near her pacemaker site as well as  fatigue.  She had checked her heart rate when she was feeling bad, and  her blood pressure monitor said her heart rate was 49.   On pacer check today, she has not had any further tachycardia, but Dr.  Juanda Chance did readjust her pacemaker.  He changed her from DDIR to DDDR,  and he made her mode switch to 140 and her upper track at 115.  He is  hoping that this will help resolve some of her tachycardia.  She is  anxious to play in the senior games in basketball that start next month  and has just not been feeling well.  She attributes a lot of her fatigue  to the Coumadin.   CURRENT MEDICATIONS:  1. Micardis 40 mg daily.  2. Synthroid 88 mcg daily.  3. Calcium daily.  4. Warfarin 5 mg daily.  5. Diltiazem 120 mg daily.   PHYSICAL EXAMINATION:  This is a very pleasant 77 year old white female  in no acute distress.  Blood pressure 143/79, pulse 83, weight 174.  NECK:  Without JVD, HJR, bruits, or thyroid enlargement.  LUNGS:  Clear anterior, posterior, and lateral.  HEART:  Regular rate and rhythm at 70 beats per minute.  Normal S1 and  S2.  No murmur, rub, bruit, thrill, or heave noted.  ABDOMEN:  Soft without  organomegaly, masses, lesions, or abnormal  tenderness.  EXTREMITIES:  Without clubbing, cyanosis or edema.  She has good distal  pulses.   EKG:  AV pacing.   IMPRESSION:  1. Ectopic atrial tachycardia:  Pacemaker reprogrammed to DDR, mode      switch at 140 and upper track at 115.  2. Status post Medtronic pacemaker placement for sick sinus syndrome.  3. Paroxysmal atrial fibrillation.  4. Hypertension.  5. History of mastectomy secondary to breast cancer.  6. Gastroesophageal reflux disease.  7. Inner ear problem.   PLAN:  At this time, we will make no further changes and dictated above.  Dr. Juanda Chance will see her back in three months' time.  She is to call if  she has any further problems.      Jacolyn Reedy, PA-C  Electronically Signed      Everardo Beals. Juanda Chance, MD, [facility]  Electronically Signed  ML/MedQ  DD: 04/20/2007  DT: 04/20/2007  Job #: [id number]

## 2010-06-24 NOTE — H&P (Signed)
NAMESHAY, JHAVERI NO.:  [id number]   MEDICAL RECORD NO.:  [id number]          PATIENT TYPE:  INP   LOCATION:  2010                         FACILITY:  [facility]   PHYSICIAN:  Christell Faith, MD   DATE OF BIRTH:  March 07, 1921   DATE OF ADMISSION:  06/10/2006  DATE OF DISCHARGE:                              HISTORY & PHYSICAL   DIAGNOSIS:  Pre syncope and sick sinus syndrome.   PRIMARY CARE PHYSICIAN:  Dr. Samuel Germany   CHIEF COMPLAINT:  Almost passed out.   HISTORY OF PRESENT ILLNESS:  This is an 77 year old white female with  near syncope upon standing up tonight at around 9:00 p.m.  She did not  lose consciousness.  She complains also of persistent vertigo and  staggering gait.  These complaints are largely unchanged since she saw  Dr. Juanda Chance in clinic on June 01, 2006, at which time, he felt she  needed a pacemaker and, in fact, she is scheduled for one tomorrow.  She  has been documented to have paroxysmal atrial fibrillation and sick  sinus syndrome with heart rates into the 30s and 40s.  She came to the  emergency room tonight because she said she had never come so close to  passing out.   REVIEW OF SYSTEMS:  She has had paroxysmal nocturnal dyspnea twice in  the past few weeks.  She gets mild lower extremity edema, which resolves  with elevation of the extremities.  No chest pain.  She occasionally  feels short of breath at night, but not with exertion.  She feels  fatigued.  She has had a nonproductive cough for 10 weeks.  No headache,  no fevers, chills, sweats, nausea, vomiting, diarrhea, or rash.  The  balance of 10 systems is reviewed and is negative.   PAST MEDICAL HISTORY:  1. Diet-controlled diabetes.  2. Hypertension.  3. Sick sinus syndrome and paroxysmal atrial fibrillation.  4. Appendectomy.  5. Hypothyroidism.  6. Cardiac catheterization in 2005 showed nonobstructive disease.   MEDICINES:  1. Micardis 40 mg p.o. daily.  2. Synthroid 88  mcg p.o. daily.  3. Aspirin 81 mg p.o. daily.  4. She is on the Rely study drug (which is not Coumadin).   ALLERGIES:  None.   FAMILY HISTORY:  She has 2 sons, who live nearby.  She is widowed.   SOCIAL HISTORY:  Never been a smoker.  Never been a drinker.  She  participates in senior athletic games and, in fact, has been doing that  over the past week.   PHYSICAL EXAM:  Temperature 96.3, initial blood pressure 196/84, recheck  145/70, heart rate 47, recheck 50, saturation 97% on room air.  GENERAL:  This is a pleasant elderly white female in no distress.  HEENT:  Pupils are round and reactive.  Sclerae are clear.  Neck is supple.  She has a distended external jugular vein.  No carotid  bruits.  Carotid upstrokes are normal to palpation.  Lungs are clear to auscultation bilaterally without wheezing or rales.  CARDIAC EXAM:  The rate is bradycardic.  The rhythm  is irregular.  There  is a 2/6 harsh systolic ejection murmur best heard at the base of the  heart.  No S3, no S4.  ABDOMEN:  Soft, nontender, nondistended.  Normal bowel sounds.  EXTREMITIES:  No rash, no edema.  2+ dorsalis pedis and radial pulses  bilaterally.   Chest x-ray shows cardiomegaly with hyperinflation.  No acute findings.   EKG shows slow atrial fibrillation with a rate of 43 beats per minute.   White count 5.8, hemoglobin 13.3, platelets 165.  Troponin less than  0.05.  INR 1.1. Sodium 137, potassium 3.9, BUN 12, creatinine 0.8.  Glucose 164.   IMPRESSION:  77 year old female with symptomatic bradycardia.   PLAN:  1. She is already scheduled for a pacemaker placement tomorrow.  So,      we will go ahead and admit her to telemetry.  Keep her NPO and plan      to proceed with this scheduled procedure.  We will make sure that      her coags are normal and have an active type and screen.  She is      currently stable with no indication for emergent temporary pacer.  2. We will continue Synthroid, aspirin and  Micardis, but will hold the      study drug.  3. We will check BNP and echo given her heart murmur and paroxysmal      nocturnal dyspnea.  4. We will check a chest x-ray given that she has had this chronic      cough for 10 weeks.  It is unlikely that this is related to the      angiotensin receptor blocker, but it is a possibility.  5. We will place her on an insulin sliding scale and check blood      sugars given that she is a diet-controlled diabetic.      Christell Faith, MD  Electronically Signed     NDL/MEDQ  D:  06/11/2006  T:  06/11/2006  Job:  [id number]

## 2010-06-27 NOTE — Assessment & Plan Note (Signed)
[facility]                            CARDIOLOGY OFFICE NOTE   BRENDAN, GRUWELL                      MRN:          [id number]  DATE:06/01/2006                            DOB:          26-Jan-1922    REFERRING PHYSICIAN:  Renae Fickle   CLINICAL HISTORY:  [name] is 77 years old, and returns for a followup  evaluation and management of bradycardia and dizziness.  I had seen her  in April of this year with symptoms of lightheadedness and dizziness.  She had seen Dr. Samuel Germany, and had an ECG, which showed a heart rate as low  as 40.  We subsequently did a Holter monitor, which showed pauses as  long as 2.9 seconds, and bradycardia in the low 30s.  She has had  continue dizziness, although she has participated in the senior games  with several activities including shot put.   She also had some unsteadiness of gait, and I was concerned she might  have had a posterior circulation TIA or stroke.  She saw Dr. Nash Shearer at  Neurology, and he scheduled her for an MRI this Thursday.   She also has a history of coronary disease with small caliber distal LAD  by catheterization in 2005.   She has also had history of paroxysmal atrial fibrillation, and is on  the Rely study drug.   Her past medical history is significant for:  1. Hypertension.  2. GERD.  3. Treated hypothyroidism.  4. Previous mastectomy for cancer.   Her current medications include:  1. Micardis 40 mg daily.  2. Synthroid 88 mcg daily.  3. Rely study drug.  4. Calcium.  5. Aspirin.   EXAMINATION:  The blood pressure is 160/66, and the pulse 42 and  regular.  There was no venous distention.  The carotid pulses were full without  bruits.  CHEST:  Was clear.  HEART:  Rhythm was regular.  I could hear no murmurs or gallops.  ABDOMEN:  Soft without organomegaly.  Peripheral pulses were full, and no peripheral edema.   Electrocardiogram showed sinus bradycardia with a rate of 35.   IMPRESSION:  1. Sick sinus syndrome with marked bradycardia and a history of      previous paroxysmal atrial fibrillation.  2. Recent episode of unsteady gait.  Suspect possible posterior      circulation transient ischemic attack or stroke.  3. Rely study drug.  4. Hypertension.  5. Coronary artery disease with mostly nonobstructive disease, and      small caliber distal left anterior descending.  6. Good left ventricular function.   RECOMMENDATIONS:  [name] has rather marked bradycardia with  associated symptoms, and I think she has a clear indication to proceed  with permanent pacing.  We will plan to schedule this next week on  Friday.  The Rely study drug that she is taking has a short half-life,  and we will have her hold the study drug on Thursday and Thursday  evening, and Friday morning, and have her come in Friday for the  pacemaker.  We will plan to  resume the drug Friday evening.  She prefers  the left side.  I told her that she could participate in the senior  games the rest of this week as long as it was not strenuous events.     Bruce Elvera Lennox Juanda Chance, MD, [facility]  Electronically Signed    BRB/MedQ  DD: 06/01/2006  DT: 06/01/2006  Job #: [id number]

## 2010-06-27 NOTE — Assessment & Plan Note (Signed)
[facility]                           GASTROENTEROLOGY OFFICE NOTE   [name], [name]                      MRN:          [id number]  DATE:08/31/2005                            DOB:          Aug 15, 1921    REFERRING PHYSICIAN:  Everardo Beals. Juanda Chance, MD, [facility]   Primary care physician is Dr. Renae Fickle.   GI PROBLEM LIST:  1.  Intermittent solid liquid and liquid dysphagia.  Status post 09/2004 EGD      that was normal.  Recommended monometry studies but patient declined.      No weight loss.  2.  Chronic intermittent abdominal pain.  Pain is epigastric, substernal,      specifically aggravated when moving side to side in bed at night.  Pain      is mostly in her mid-upper back and radiates to her abdomen      substernally.   INTERVAL HISTORY:  I last saw [name] one year ago.  At that point, she  was sent to me for a solid and liquid dysphagia.  We did a workup, and she  declined further workup with esophageal monometry.  Since then, her  dysphagia has not been a real bother for her.  Her biggest complaint today  is upper back pain that radiates to her epigastrium.  This is a similar  complaint that she had back in August of 2006, although she says this time  the pain has always been a bother for several weeks, that is what she told  me also in 2006.  I suspect it is more of a chronic condition for her.  She  is very clear that the pain starts in her back and radiates forward, and it  is definitely worse when laying in bed and shifting positions.  The pain  does not keep her from sleeping.  She actually sleeps very soundly.  It is  just a nuisance as she is trying to get to sleep.  She takes no pain  medicines.  In 2006, I thought that this was probably not gastrointestinal  and recommended for her to take two Tylenol a day.  She did not try that.   CURRENT MEDICINES:  1.  Calcium.  2.  Micardis.  3.  Hydrochlorothiazide.  4.  Synthroid.  5.   Aspirin.   PHYSICAL EXAMINATION:  VITAL SIGNS:  178 pounds, down 2 pounds from one year  ago.  Blood pressure 110/64, pulse 64.  CONSTITUTIONAL:  Generally well-appearing.  NEUROLOGIC:  Alert and oriented x3.  LUNGS:  Clear to auscultation bilaterally.  HEART:  Irregularly irregular.  ABDOMEN:  Soft, nontender, nondistended.  Normal bowel sounds.  UPPER BACK:  No tender spots on palpation.   ASSESSMENT/PLAN:  An 77 year old woman with chronic upper back pain that  radiates to her epigastrium.   This pain sounds musculoskeletal since it is so positional.  She had a  abdominal ultrasound recently in [place] and brought along a disc.  There  is no report to accompany that disc, but she tells me that she was informed  that every thing  was normal and that she did not have gallbladder problems.  The pain indeed does not sound biliary.  Rather, this seems musculoskeletal.  She will have a basic set of labs done today including a CBC.  We will track  down all her recent abdominal imaging from [facility] with the reports.  I  have also recommended that she take two Extra-Strength Tylenol about two  hours before she lays down for bed on a nightly basis for the next several  weeks, and she will return to see me in one month's time.                                   Rachael Fee, MD   DPJ/MedQ  DD:  08/31/2005  DT:  08/31/2005  Job #:  [id number]   cc:   Everardo Beals. Juanda Chance, MD, [facility]  Renae Fickle

## 2010-06-27 NOTE — Assessment & Plan Note (Signed)
[facility]                            CARDIOLOGY OFFICE NOTE   Dominique, Adkins                      MRN:          [id number]  DATE:05/20/2006                            DOB:          02-11-1921    PRIMARY. CARE PHYSICIAN:  Dr. Renae Fickle.   CLINICAL HISTORY:  [name] is 77 years old came in for an unscheduled  visit today because of an episode the night before after she woke up  extremely short of breath. Her symptoms continued and she developed a  very unsteady gait and difficultly with her balance. She saw Dr. Samuel Germany  earlier today and he did an ECG which showed a very slow heart rate of  about 30. He arranged for her to have a Holter monitor at Dr. Jannette Spanner  Office but there was a wait and so she came up here for her arranged  appointment instead. She has had no chest pain, but she has had  continued shortness of breath with exertion.   She has a history of coronary disease with a small caliber distal LAD  but no major other obstruction at catheterization 2005. She also has had  previous bradycardia and we have considered a pacemaker and we did a  treadmill test to see if she had chronotropic incompetence and decided  against the pacemaker at present. She also has had paroxysmal atrial  fibrillation and is on the Rely study and is taking Rely study drug.   PAST MEDICAL HISTORY:  Significant for hypertension, GERD,  hypothyroidism, and previous mastectomy.   CURRENT MEDICATIONS:  Include:  Micardis, Synthroid, Rely study drug,  calcium, and aspirin.   PHYSICAL EXAMINATION:  Blood pressure is 152/76, pulse 54 and regular.  There was no venous distension. The carotid pulse were full without  bruits.  CHEST: Clear. Cardiac rhythm was regular. I could hear no murmurs or  gallops.  ABDOMEN: Soft with normal bowel sounds. There is no hepatosplenomegaly.  Peripheral pulses were full and there was no peripheral edema.  NEUROLOGICAL  EXAMINATION: Showed no focal signs but walking she had a  somewhat wide base unsteady gait.   IMPRESSION:  1. Recent episode of dyspnea and unsteady gait, suspect possible      recent posterior circulation TIA or stroke.  2. Sick sinus syndrome with marked bradycardia, possibly contributing      to her symptoms.  3. History of paroxysmal atrial fibrillation.  4. Rely study on Rely study drug.  5. Hypertension.  6. Coronary artery disease with mostly nonobstructive disease.  7. Good left ventricular function.   RECOMMENDATIONS:  I am concerned that [name] may have had a stroke  and I am also concerned that her bradycardia may have been more severe.  We will plan to get a Holter monitor on her today and I think she may  need a pacemaker. We will also arrange for her to have a neurological  examination. We will get a chest x-ray, and we will get a D-dimer, and a  CBC, and chem 7.  I will plan to see her back  in 2 weeks to go over the  findings or sooner if her Holter monitor shows marked bradycardia. She  participates in the [employer] which are the 19th of this month and is  still anxious to try and participate in this. I told her it would depend  on what her monitor shows.     Bruce Elvera Lennox Juanda Chance, MD, [facility]  Electronically Signed    BRB/MedQ  DD: 05/20/2006  DT: 05/20/2006  Job #: [id number]

## 2010-06-27 NOTE — Op Note (Signed)
[place]  Patient:    [name], [name]                      MRN: [id number] Proc. Date: 01/05/00 Adm. Date:  [id number] Disc. Date: [id number] Attending:  Mick Sell                           Operative Report  PREOPERATIVE DIAGNOSIS:  Cataract, left eye.  POSTOPERATIVE DIAGNOSIS:  Cataract, left eye.  OPERATION PERFORMED:  Cataract extraction with intraocular lens implant, left eye.  SURGEON:  Chucky May, M.D.  INDICATIONS FOR SURGERY:  The patient is a 77 year old female with painless progressive decrease in vision so that she has difficulty seeing for reading. On examination the patient was found to have a dense nuclear sclerotic and cortical cataract consistent with the decrease in visual acuity.  DESCRIPTION OF PROCEDURE:  The patient was brought to the main operating room and placed in supine position.  Anesthesia was obtained by means of topical 4% lidocaine drops with tetracaine.  The patient was then prepped and draped in the usual manner.  A lid speculum was inserted and the cornea was entered with a diamond keratome superiorly with an additional port superior temporally. OcuCoat was instilled and an anterior capsulorrhexis was performed without difficulty.  The nucleus was mobilized by hydrodissection, phacoemulsified, and residual cortical material removed by irrigation and aspiration.  The posterior capsule was polished and a posterior chamber lens implant was placed in the bag without difficulty.  OcuCoat was removed and replaced with balanced salt solution.  The wound was hydrated with balanced salt solution and checked for fluid leaks but none were noted.  The eye was dressed with topical Pred Forte, Ocuflox, Voltaren and a Fox shield and the patient was taken to the recovery room in excellent condition where she received written and verbal instructions for her postoperative care and was scheduled for follow up in  24 hours. DD:  01/29/00 TD:  01/29/00 Job: [id number]

## 2010-06-27 NOTE — Assessment & Plan Note (Signed)
[facility]                              CARDIOLOGY OFFICE NOTE   ARCENIA, SCARBRO                      MRN:          [id number]  DATE:11/23/2005                            DOB:          Jan 03, 1922    Primary care physician is Renae Fickle.   CLINICAL HISTORY:  Ms. Atkin is 77 years old and has had paroxysmal atrial  fibrillation and has had slow rates in the 50s.  She did have some rates  down to 32 for a few beats.  There were no definite symptoms associated with  this.  I saw her for evaluation  for a possible pacemaker, and we arranged  for her to have a treadmill test to see if she had chronotropic  incompetence, and she increased her heart rate over 100, and we thought she  only had mild chronotropic incompetence.  We elected to follow her without a  pacemaker.   She also has had a catheterization which showed a small caliber distal LAD,  but no major obstruction in 2005.  She did have an episode of chest and arm  pain last week, but this subsequently resolved.   PAST MEDICAL HISTORY:  1. Hypertension.  2. Previous mastectomy.  3. Hypothyroidism.  4. Gastroesophageal reflux disease.   CURRENT MEDICATIONS:  Her current medications include calcium, Synthroid,  aspirin and Micardis/hydrochlorothiazide.   EXAMINATION:  VITAL SIGNS:  Blood pressure 136/72.  Pulse 73 and regular.  NECK:  There was no venous distention.  The carotid pulses were full without  bruits.  CHEST:  Clear.  CARDIAC:  Rhythm was regular.  I heard no murmurs or gallops.  ABDOMEN:  Soft without organomegaly.  EXTREMITIES:  Peripheral pulses were full.  There was no peripheral edema.   An ECG today showed sinus bradycardia with a rate of 51.  Her previous ECG's  have shown atrial fibrillation.   IMPRESSION:  1. Sick sinus syndrome with paroxysmal atrial fibrillation.  2. Hypertension.  3. Mild nonobstructive coronary artery disease with catheterization.  4.  History of previous mastectomy.   RECOMMENDATIONS:  1. I think Ms. Maudlin is doing alright from a cardiac standpoint.  I do not      think the episode of chest and arm pain last week is likely ischemic.      We will continue to follow her for that.  She was scheduled to come in      and consider the Rely protocol where she would be either on Coumadin or      Rely study drug, which is an oral anticoagulant that we think may take      the place of Coumadin.  Somehow this did not happen, but I asked Maureen Ralphs      to talk to her today about the Relay protocol, and we will try to get      her on either Coumadin and a Rely study drug.  2. I will plan to see her back in followup in 6 months.            ______________________________  Everardo Beals.  Juanda Chance, MD, [facility]     BRB/MedQ  DD:  11/23/2005  DT:  11/24/2005  Job #:  [id number]

## 2010-06-27 NOTE — Assessment & Plan Note (Signed)
[facility]                           GASTROENTEROLOGY OFFICE NOTE   [name], [name]                      MRN:          [id number]  DATE:09/28/2005                            DOB:          Dec 31, 1921    GASTROINTESTINAL FOLLOWUP NOTE:   PRIMARY CARE PHYSICIAN:  Renae Fickle, M.D.   GASTROINTESTINAL PROBLEM LIST:  1. Intermittent solid and liquid dysphagia, status post EGD in August 2006      that was normal.  Recommended manometry, but patient declined.  No      weight loss.  2. Chronic intermittent low back pain radiating to abdomen.  The pain is      epigastric, substernal, specifically aggravated when moving side to      side in her bed at night.  The pain is mostly in the upper mid back and      radiates to her abdomen.  CBC and liver tests in July of 2007 were      completely normal.  The patient reluctant to take Tylenol on a regular      basis.   MEDICAL HISTORY:  I last saw [name] 1 month ago.  At that point, I was  getting very convinced that her discomforts are musculoskeletal in origin.  She describes pain beginning in her mid to lower back, radiating around to  her abdomen, whenever she is in a certain position in bed at night or  shifting side to side.  I recommended a second time that she try taking  Tylenol on a daily basis, and usually since her pains are at night, I  recommended probably around dinner time would be best.  She tries it  intermittently, but does not want to get hooked on any pain medicines, and  is very nervous about taking it more than just on an as-needed basis.   CURRENT MEDICATIONS:  1. Calcium.  2. Micardis.  3. Synthroid.  4. Aspirin.   PHYSICAL EXAMINATION:  VITAL SIGNS:  Weight 179 pounds, up 1 pound since her  last visit.  CONSTITUTIONAL:  Generally well appearing.  NEUROLOGIC:  Alert and oriented x3.  HEENT:  Eyes - extraocular movements intact.  LUNGS:  Clear to auscultation bilaterally.  HEART:  Regular rate and rhythm.  ABDOMEN:  Soft, nontender, nondistended.  Normal bowel sounds.   ASSESSMENT AND PLAN:  An 77 year old woman with chronic lower and mid back  pain that radiates to her epigastrium.  This still sounds very  musculoskeletal in nature.  She is very reluctant to take Tylenol, even on a  once daily basis.  I tried to reassure her that this is not habit forming  and that no long-term side effects have been really described as low doses.  I urged her again to try taking Tylenol two extra strength around dinner  time to help her get through her night of discomfort.  I also recommended  that she discuss this further with her primary care physician, and perhaps  she needs an orthopedic surgery referral for this back pain.  I do not think  she has any gastrointestinal illness,  since none of her pains are related to  eating, moving her bowels, nausea, etc.                                   Rachael Fee, MD   DPJ/MedQ  DD:  09/28/2005  DT:  09/28/2005  Job #:  [id number]   cc:   Renae Fickle

## 2010-06-27 NOTE — Cardiovascular Report (Signed)
NAMEKEANU, FRICKEY                         ACCOUNT NO.:  [id number]   MEDICAL RECORD NO.:  [id number]                   PATIENT TYPE:  OIB   LOCATION:  2899                                 FACILITY:  [facility]   PHYSICIAN:  Charlies Constable, M.D. [facility]              DATE OF BIRTH:  December 09, 1921   DATE OF PROCEDURE:  05/30/2003  DATE OF DISCHARGE:  05/30/2003                              CARDIAC CATHETERIZATION   HISTORY:  Ms. Coyt is 77 years old.  I recently saw her in consultation for  consideration of a pacemaker because of bradycardia and associated symptoms.  I was not sure that she had enough bradycardia to warrant a pacemaker at  this point but was concerned about her symptoms of chest pain and her  positive risk factor profile for vascular disease.  She had a Cardiolite  scan which suggests anterior ischemia so we brought her in today for  evaluation with angiography.   PROCEDURE:  The procedure was performed in the right femoral artery with an  arterial sheath and 6-French preformed coronary catheters.  A front wall  arterial puncture was performed and Omnipaque contrast was used.  A distal  aortogram was performed to rule out renovascular cause for hypertension.  The patient's right femoral artery was closed with Angio-Seal at the end of  the procedure.  The patient tolerated the procedure well and left the  laboratory in satisfactory condition.   RESULTS:  1. The left main coronary artery is free of significant disease.  2. The left anterior descending artery gave rise to two diagonal branches     and a septal perforator.  The LAD was a very small caliber vessel     distally and terminated just before the apex.  Flow was also slightly     slower in the LAD compared to the circumflex artery.  3. Circumflex coronary artery.  The circumflex artery gave rise to a large     marginal branch which had three sub-branches and an AV branch which     terminated in the posterolateral  branch.  These vessels were free of     significant disease.  4. Right coronary artery.  The right coronary artery is a dominant vessel.     It gave rise to the conus branch, two right ventricular branches, a     posterior descending branch, and three posterolateral branches.  These     vessels were free of significant disease.  5. Left ventriculogram.  The left ventriculogram was performed in the RAO     projection and showed good wall motion but no areas of hypokinesis.  The     estimated ejection fraction was 60%.  There was mitral valve prolapse but     no regurgitation.  6. A distal aortogram was performed which showed patent renal arteries and     no significant __________ .   CONCLUSION:  1. Nonobstructive  coronary artery disease with a small caliber distal LAD     and slightly decreased flow in the distal LAD but no major obstruction.  2. Normal left ventricular function.   RECOMMENDATIONS:  Reassurance.  These findings indicate no major epicardial  coronary disease.  Will plan continued medical management.  I will see her  back in a couple of weeks and will consider stress test to check her for  chronotropic incompetence to help in decision regarding whether she needs to  have a permanent pacemaker.                                              Charlies Constable, M.D. [facility]   BB/MEDQ  D:  05/30/2003  T:  05/31/2003  Job:  [id number]

## 2010-07-15 ENCOUNTER — Encounter: Payer: Self-pay | Admitting: Internal Medicine

## 2010-08-08 ENCOUNTER — Encounter: Payer: Self-pay | Admitting: Internal Medicine

## 2010-08-08 ENCOUNTER — Ambulatory Visit (INDEPENDENT_AMBULATORY_CARE_PROVIDER_SITE_OTHER): Payer: Medicare Other | Admitting: Internal Medicine

## 2010-08-08 DIAGNOSIS — I4891 Unspecified atrial fibrillation: Secondary | ICD-10-CM

## 2010-08-08 DIAGNOSIS — I10 Essential (primary) hypertension: Secondary | ICD-10-CM

## 2010-08-08 DIAGNOSIS — I509 Heart failure, unspecified: Secondary | ICD-10-CM

## 2010-08-08 DIAGNOSIS — I495 Sick sinus syndrome: Secondary | ICD-10-CM

## 2010-08-08 DIAGNOSIS — I5032 Chronic diastolic (congestive) heart failure: Secondary | ICD-10-CM

## 2010-08-08 LAB — BASIC METABOLIC PANEL
CO2: 27 mEq/L (ref 19–32)
Calcium: 9.9 mg/dL (ref 8.4–10.5)
Sodium: 143 mEq/L (ref 135–145)

## 2010-08-08 LAB — PACEMAKER DEVICE OBSERVATION
AL AMPLITUDE: 1 mv
BATTERY VOLTAGE: 2.77 V
BRDY-0002RV: 60 {beats}/min
BRDY-0003RV: 105 {beats}/min
RV LEAD THRESHOLD: 0.75 V

## 2010-08-08 LAB — CBC WITH DIFFERENTIAL/PLATELET
Basophils Relative: 0.2 % (ref 0.0–3.0)
Eosinophils Absolute: 0.1 10*3/uL (ref 0.0–0.7)
Hemoglobin: 13.3 g/dL (ref 12.0–15.0)
Lymphocytes Relative: 22.7 % (ref 12.0–46.0)
MCHC: 34.4 g/dL (ref 30.0–36.0)
Monocytes Relative: 8.2 % (ref 3.0–12.0)
Neutro Abs: 4.1 10*3/uL (ref 1.4–7.7)
RBC: 4.16 Mil/uL (ref 3.87–5.11)

## 2010-08-08 MED ORDER — LISINOPRIL 5 MG PO TABS: 5.0000 mg | ORAL_TABLET | Freq: Every day | ORAL | Status: DC

## 2010-08-08 NOTE — Assessment & Plan Note (Signed)
Stable Salt restriction Add ACE inhibitor

## 2010-08-08 NOTE — Assessment & Plan Note (Signed)
Normal pacemaker function See Pace Art report No changes today  

## 2010-08-08 NOTE — Assessment & Plan Note (Signed)
Above goal today Start lisinopril 5mg  daily BMET today

## 2010-08-08 NOTE — Patient Instructions (Signed)
Your physician wants you to follow-up in: 6 months with Dr Jacquiline Doe will receive a reminder letter in the mail two months in advance. If you don't receive a letter, please call our office to schedule the follow-up appointment.   Your physician has recommended you make the following change in your medication: start Lisinopril 5 mg daily  Your physician recommends that you return for lab work today--bmp and cbc

## 2010-08-08 NOTE — Progress Notes (Signed)
[name] is a pleasant 77 y.o.WF patient with a h/o sick sinus syndrome, paroxysmal atrial fibrillation, and HTN sp PPM (MDT) by Dr Juanda Chance who presents today for cardiology follow-up.  The patient reports doing very well since having a pacemaker implanted and remains very active despite her age.   Today, she  denies symptoms of palpitations, chest pain, shortness of breath, orthopnea, PND, lower extremity edema, dizziness, presyncope, syncope, or neurologic sequela.  The patientis tolerating medications without difficulties and is otherwise without complaint today.   Past Medical History  Diagnosis Date  . Sick sinus syndrome     s/p PPM by Dr Juanda Chance (MDT)  . Hypertension   . FHx: mastectomy     for breast cancer  . Paroxysmal atrial fibrillation     on pradaxa  . Gastro - esophageal reflux disease     Past Surgical History  Procedure Date  . Pacemaker insertion     by Dr Juanda Chance for tachy/brady syndrome (MDT)    History   Social History  . Marital Status: Widowed    Spouse Name: N/A    Number of Children: N/A  . Years of Education: N/A   Occupational History  . Not on file.   Social History Main Topics  . Smoking status: Never Smoker   . Smokeless tobacco: Not on file  . Alcohol Use: No  . Drug Use: No  . Sexually Active: Not on file   Other Topics Concern  . Not on file   Social History Narrative   Participates in a senior games.      No family history on file.  No Known Allergies  Current Outpatient Prescriptions  Medication Sig Dispense Refill  . Acetaminophen (EXTRA STRENGTH ACETAMINOPHEN) 500 MG coapsule Take by mouth. Take 2 tablets every 8 hours as needed for pain       . Calcium Carbonate-Vitamin D (CALCIUM + D PO) Take by mouth daily.        . dabigatran (PRADAXA) 150 MG CAPS Take 150 mg by mouth 2 (two) times daily.        Marland Kitchen diltiazem (DILACOR XR) 180 MG 24 hr capsule Take 180 mg by mouth daily.        Marland Kitchen donepezil (ARICEPT) 5 MG tablet Take 5 mg  by mouth at bedtime.        Marland Kitchen levothyroxine (SYNTHROID) 88 MCG tablet Take 88 mcg by mouth every other day. Alternating with 100mg  every other day      . omeprazole (PRILOSEC) 20 MG capsule Take 20 mg by mouth daily.        . vitamin B-12 (CYANOCOBALAMIN) 1000 MCG tablet Take 1,000 mcg by mouth daily.        Marland Kitchen DISCONTD: furosemide (LASIX) 20 MG tablet Take 20 mg by mouth 2 (two) times daily.          ROS- all systems are reviewed and negative except as per HPI  Physical Exam: Filed Vitals:   08/08/10 1226  BP: 163/78  Pulse: 73  Resp: 16  Height: 5\' 4"  (1.626 m)  Weight: 167 lb (75.751 kg)    GEN- The patient is well appearing, alert and oriented x 3 today.   Head- normocephalic, atraumatic Eyes-  Sclera clear, conjunctiva pink Ears- hearing intact Oropharynx- clear Neck- supple, no JVP Lymph- no cervical lymphadenopathy Lungs- Clear to ausculation bilaterally, normal work of breathing Chest- R sided pacemaker pocket is well healed Heart- Regular rate and rhythm, no murmurs, rubs or  gallops, PMI not laterally displaced GI- soft, NT, ND, + BS Extremities- no clubbing, cyanosis, or edema MS- age appropriate muscle atrophy Skin- no rash or lesion Psych- euthymic mood, full affect Neuro- strength and sensation are intact  Pacemaker interrogation- reviewed in detail today,  See PACEART report  Assessment and Plan:

## 2010-08-08 NOTE — Assessment & Plan Note (Signed)
Doing well, Maintaining sinus rhythm On pradaxa for CVA prevention We will check CrCl (BMET) today and also CBC

## 2010-08-15 ENCOUNTER — Telehealth: Payer: Self-pay | Admitting: Internal Medicine

## 2010-08-15 NOTE — Telephone Encounter (Signed)
Spoke with pharmacy, they state RX is ready dont know why they called.

## 2010-08-15 NOTE — Telephone Encounter (Signed)
Please call [facility] and verify dosage of Pradaxa.  They are waiting before refilling.

## 2010-09-17 ENCOUNTER — Other Ambulatory Visit: Payer: Self-pay | Admitting: Family Medicine

## 2010-09-17 DIAGNOSIS — Z1231 Encounter for screening mammogram for malignant neoplasm of breast: Secondary | ICD-10-CM

## 2010-10-14 ENCOUNTER — Encounter: Payer: Self-pay | Admitting: Gastroenterology

## 2010-10-14 ENCOUNTER — Ambulatory Visit (INDEPENDENT_AMBULATORY_CARE_PROVIDER_SITE_OTHER): Payer: Medicare Other | Admitting: Gastroenterology

## 2010-10-14 DIAGNOSIS — R1310 Dysphagia, unspecified: Secondary | ICD-10-CM | POA: Insufficient documentation

## 2010-10-14 NOTE — Patient Instructions (Addendum)
Barium esophagram. You may need EGD depending on the results of the barium esophagram. Chew you food well, eat slowly for now. Continue on omeprazole once or twice daily. A copy of this information will be made available to Dr. Samuel Germany. Your barium esophagram is scheduled on 10/16/2010 at 9am at [facility] radiology department

## 2010-10-14 NOTE — Progress Notes (Signed)
Review of pertinent gastrointestinal problems: 1. Intermittent solid and liquid dysphagia, status post EGD in August 2006  that was normal. Recommended manometry, but patient declined. No  weight loss.   2. Chronic intermittent low back pain radiating to abdomen. The pain is epigastric, substernal, specifically aggravated when moving side to side in her bed at night. The pain is mostly in the upper mid back and radiates to her abdomen. CBC and liver tests in July of 2007 were completely normal. The patient reluctant to take Tylenol on a regular basis.  HPI: This is a  pleasant E89-year-old woman who is in remarkably good mental and physical health. I last saw her about 5 years ago. I was seeing her for dysphasia and I have recommended esophageal manometry however she declined. She was not losing any weight and the EGD was essentially normal.  Has troubles with pills.  Also a lot of whitish foam.  She has lost 5 pounds in past several months.  Solid foods with hang in distal esophagus.  Will belch a lot.  She does not have pyrosis.  She has a prescription for omeprazole which she has not filled in a month or 2.  She's "had a great life" but this past several months has had more health problems, pneumonia.     Review of systems: Pertinent positive and negative review of systems were noted in the above HPI section.  All other review of systems was otherwise negative.   Past Medical History  Diagnosis Date  . Sick sinus syndrome     s/p PPM by Dr Juanda Chance (MDT)  . Hypertension   . FHx: mastectomy     for breast cancer  . Paroxysmal atrial fibrillation     on pradaxa  . Gastro - esophageal reflux disease   . Acute bronchitis   . Allergic rhinitis due to pollen   . Abnormal coagulation profile   . Other specified acquired hypothyroidism   . Body mass index 28.0-28.9, adult   . Other and unspecified hyperlipidemia   . Encounter for long-term (current) use of other medications   . Other  B-complex deficiencies   . Memory loss   . Pain in limb   . Dizziness and giddiness     Past Surgical History  Procedure Date  . Pacemaker insertion     by Dr Juanda Chance for tachy/brady syndrome (MDT)  . Mastectomy   . Tonsillectomy   . Appendectomy      reports that she has never smoked. She does not have any smokeless tobacco history on file. She reports that she does not drink alcohol or use illicit drugs.  family history includes Diabetes in her brother and sister; Heart disease in her maternal grandfather; Stroke in her brother; and Tuberculosis in her mother.    Current Medications, Allergies were all reviewed with the patient via [facility] electronic medical record system.    Physical Exam: BP 148/74  Pulse 66  Ht 5\' 4"  (1.626 m)  Wt 161 lb (73.029 kg)  BMI 27.64 kg/m2 Constitutional: generally well-appearing Psychiatric: alert and oriented x3 Eyes: extraocular movements intact Mouth: oral pharynx moist, no lesions Neck: supple no lymphadenopathy Cardiovascular: heart regular rate and rhythm Lungs: clear to auscultation bilaterally Abdomen: soft, nontender, nondistended, no obvious ascites, no peritoneal signs, normal bowel sounds Extremities: no lower extremity edema bilaterally Skin: no lesions on visible extremities    Assessment and plan: 77 y.o. female with chronic intermittent dysphagia, recent mild weight loss  We will  proceed with a barium esophagram to evaluate her esophagus. Depending on those results I may need to proceed with EGD.

## 2010-10-16 ENCOUNTER — Ambulatory Visit (HOSPITAL_COMMUNITY)
Admission: RE | Admit: 2010-10-16 | Discharge: 2010-10-16 | Disposition: A | Discharge status: Home / Self Care | Payer: Medicare Other | Source: Ambulatory Visit | Attending: Gastroenterology | Admitting: Gastroenterology

## 2010-10-16 DIAGNOSIS — R1110 Vomiting, unspecified: Secondary | ICD-10-CM | POA: Insufficient documentation

## 2010-10-16 DIAGNOSIS — K224 Dyskinesia of esophagus: Secondary | ICD-10-CM | POA: Insufficient documentation

## 2010-10-20 ENCOUNTER — Ambulatory Visit
Admission: RE | Admit: 2010-10-20 | Discharge: 2010-10-20 | Disposition: A | Discharge status: Home / Self Care | Payer: Medicare Other | Source: Ambulatory Visit | Attending: *Deleted | Admitting: *Deleted

## 2010-10-20 DIAGNOSIS — Z1231 Encounter for screening mammogram for malignant neoplasm of breast: Secondary | ICD-10-CM

## 2010-12-17 ENCOUNTER — Ambulatory Visit (INDEPENDENT_AMBULATORY_CARE_PROVIDER_SITE_OTHER): Payer: Medicare Other | Admitting: Internal Medicine

## 2010-12-17 ENCOUNTER — Encounter: Payer: Self-pay | Admitting: Internal Medicine

## 2010-12-17 DIAGNOSIS — I495 Sick sinus syndrome: Secondary | ICD-10-CM

## 2010-12-17 DIAGNOSIS — I5033 Acute on chronic diastolic (congestive) heart failure: Secondary | ICD-10-CM

## 2010-12-17 DIAGNOSIS — I4891 Unspecified atrial fibrillation: Secondary | ICD-10-CM

## 2010-12-17 DIAGNOSIS — I10 Essential (primary) hypertension: Secondary | ICD-10-CM

## 2010-12-17 LAB — PACEMAKER DEVICE OBSERVATION
BATTERY VOLTAGE: 2.76 V
BRDY-0003RV: 105 {beats}/min
BRDY-0004RV: 105 {beats}/min
VENTRICULAR PACING PM: 97

## 2010-12-17 MED ORDER — LOSARTAN POTASSIUM 50 MG PO TABS: 50.0000 mg | ORAL_TABLET | Freq: Every day | ORAL | Status: DC

## 2010-12-17 NOTE — Assessment & Plan Note (Signed)
Above goal Stop diltiazem Add cozaar 50mg  daily BMET with PCP in 6 weeks

## 2010-12-17 NOTE — Assessment & Plan Note (Signed)
Rate controlled Device interrogation reveals persistent afib since last visit Continue pradaxa  Stop diltiazem due to swelling

## 2010-12-17 NOTE — Assessment & Plan Note (Signed)
Normal pacemaker function See Pace Art report No changes today  

## 2010-12-17 NOTE — Assessment & Plan Note (Signed)
Edema on exam today Stop diltiazem Lasix 40mg  daily Support hose 2 gram sodium diet

## 2010-12-17 NOTE — Patient Instructions (Signed)
Your physician wants you to follow-up in: 6 months with device clinic and 12 months with Dr Johney Frame Bonita Quin will receive a reminder letter in the mail two months in advance. If you don't receive a letter, please call our office to schedule the follow-up appointment.  Your physician has recommended you make the following change in your medication:  1) Stop Diltiazem 2) Start Cozaar 50mg  daily 3)Take Furosemide 40mg  daily  Your physician recommends that you return for lab work in: 6 weeks with your Primary Care MD  2 Gram Low Sodium Diet A 2 gram sodium diet restricts the amount of sodium in the diet to no more than 2 g or 2000 mg daily. Limiting the amount of sodium is often used to help lower blood pressure. It is important if you have heart, liver, or kidney problems. Many foods contain sodium for flavor and sometimes as a preservative. When the amount of sodium in a diet needs to be low, it is important to know what to look for when choosing foods and drinks. The following includes some information and guidelines to help make it easier for you to adapt to a low sodium diet. QUICK TIPS  Do not add salt to food.   Avoid convenience items and fast food.   Choose unsalted snack foods.   Buy lower sodium products, often labeled as "lower sodium" or "no salt added."   Check food labels to learn how much sodium is in 1 serving.   When eating at a restaurant, ask that your food be prepared with less salt or none, if possible.  READING FOOD LABELS FOR SODIUM INFORMATION The nutrition facts label is a good place to find how much sodium is in foods. Look for products with no more than 500 to 600 mg of sodium per meal and no more than 150 mg per serving. Remember that 2 g = 2000 mg. The food label may also list foods as:  Sodium-free: Less than 5 mg in a serving.   Very low sodium: 35 mg or less in a serving.   Low-sodium: 140 mg or less in a serving.   Light in sodium: 50% less sodium in a  serving. For example, if a food that usually has 300 mg of sodium is changed to become light in sodium, it will have 150 mg of sodium.   Reduced sodium: 25% less sodium in a serving. For example, if a food that usually has 400 mg of sodium is changed to reduced sodium, it will have 300 mg of sodium.  CHOOSING FOODS Grains  Avoid: Salted crackers and snack items. Some cereals, including instant hot cereals. Bread stuffing and biscuit mixes. Seasoned rice or pasta mixes.   Choose: Unsalted snack items. Low-sodium cereals, oats, puffed wheat and rice, shredded wheat. English muffins and bread. Pasta.  Meats  Avoid: Salted, canned, smoked, spiced, pickled meats, including fish and poultry. Bacon, ham, sausage, cold cuts, hot dogs, anchovies.   Choose: Low-sodium canned tuna and salmon. Fresh or frozen meat, poultry, and fish.  Dairy  Avoid: Processed cheese and spreads. Cottage cheese. Buttermilk and condensed milk. Regular cheese.   Choose: Milk. Low-sodium cottage cheese. Yogurt. Sour cream. Low-sodium cheese.  Fruits and Vegetables  Avoid: Regular canned vegetables. Regular canned tomato sauce and paste. Frozen vegetables in sauces. Olives. Rosita Fire. Relishes. Sauerkraut.   Choose: Low-sodium canned vegetables. Low-sodium tomato sauce and paste. Frozen or fresh vegetables. Fresh and frozen fruit.  Condiments  Avoid: Canned and packaged gravies. Worcestershire  sauce. Tartar sauce. Barbecue sauce. Soy sauce. Steak sauce. Ketchup. Onion, garlic, and table salt. Meat flavorings and tenderizers.   Choose: Fresh and dried herbs and spices. Low-sodium varieties of mustard and ketchup. Lemon juice. Tabasco sauce. Horseradish.  SAMPLE 2 GRAM SODIUM MEAL PLAN Breakfast / Sodium (mg)  1 cup low-fat milk / 143 mg   2 slices whole-wheat toast / 270 mg   1 tbs heart-healthy margarine / 153 mg   1 hard-boiled egg / 139 mg   1 small orange / 0 mg  Lunch / Sodium (mg)  1 cup raw carrots / 76  mg    cup hummus / 298 mg   1 cup low-fat milk / 143 mg    cup red grapes / 2 mg   1 whole-wheat pita bread / 356 mg  Dinner / Sodium (mg)  1 cup whole-wheat pasta / 2 mg   1 cup low-sodium tomato sauce / 73 mg   3 oz lean ground beef / 57 mg   1 small side salad (1 cup raw spinach leaves,  cup cucumber,  cup yellow bell pepper) with 1 tsp olive oil and 1 tsp red wine vinegar / 25 mg  Snack / Sodium (mg)  1 container low-fat vanilla yogurt / 107 mg   3 graham cracker squares / 127 mg  Nutrient Analysis  Calories: 2033   Protein: 77 g   Carbohydrate: 282 g   Fat: 72 g   Sodium: 1971 mg  Document Released: 01/26/2005 Document Revised: 10/08/2010 Document Reviewed: 04/29/2009 [facility] Patient Information 2012 [place], [place].

## 2010-12-17 NOTE — Progress Notes (Signed)
[name] is a pleasant 77 y.o.WF patient with a h/o sick sinus syndrome, persistent atrial fibrillation, and HTN sp PPM (MDT) by Dr Juanda Chance who presents today for cardiology follow-up.  The patient reports doing reasonably well since having a pacemaker implanted and remains very active despite her age.  She reports stable lower extremity edema.  She has fatigue and occasional unsteadiness.  Today, she  denies symptoms of palpitations, chest pain, shortness of breath, orthopnea, PND, presyncope, syncope, or neurologic sequela.  The patientis tolerating medications without difficulties and is otherwise without complaint today.   Past Medical History  Diagnosis Date  . Sick sinus syndrome     s/p PPM by Dr Juanda Chance (MDT)  . Hypertension   . FHx: mastectomy     for breast cancer  . Persistent atrial fibrillation     on pradaxa  . Gastro - esophageal reflux disease   . Allergic rhinitis due to pollen   . Other specified acquired hypothyroidism   . Body mass index 28.0-28.9, adult   . Other and unspecified hyperlipidemia   . Encounter for long-term (current) use of other medications   . Other B-complex deficiencies   . Memory loss   . Pain in limb   . Dizziness and giddiness   . Diastolic dysfunction     Past Surgical History  Procedure Date  . Pacemaker insertion     by Dr Juanda Chance for tachy/brady syndrome (MDT)  . Mastectomy   . Tonsillectomy   . Appendectomy     History   Social History  . Marital Status: Widowed    Spouse Name: N/A    Number of Children: 2  . Years of Education: N/A   Occupational History  . Retired    Social History Main Topics  . Smoking status: Never Smoker   . Smokeless tobacco: Not on file  . Alcohol Use: No  . Drug Use: No  . Sexually Active: Not on file   Other Topics Concern  . Not on file   Social History Narrative   Participates in a senior games.      Family History  Problem Relation Age of Onset  . Heart disease Maternal  Grandfather   . Diabetes Sister   . Tuberculosis Mother     father  . Diabetes Brother   . Stroke Brother     No Known Allergies  Current Outpatient Prescriptions  Medication Sig Dispense Refill  . Calcium Carbonate-Vitamin D (CALCIUM + D PO) Take by mouth daily.        . dabigatran (PRADAXA) 150 MG CAPS Take 150 mg by mouth 2 (two) times daily.        . furosemide (LASIX) 40 MG tablet Take 40 mg by mouth daily as needed.       Marland Kitchen levothyroxine (SYNTHROID) 88 MCG tablet Take 88 mcg by mouth every other day. Alternating with 100mg  every other day      . omeprazole (PRILOSEC) 20 MG capsule Take 20 mg by mouth 2 (two) times daily.       . vitamin B-12 (CYANOCOBALAMIN) 1000 MCG tablet Take 1,000 mcg by mouth daily.        Marland Kitchen losartan (COZAAR) 50 MG tablet Take 1 tablet (50 mg total) by mouth daily.  30 tablet  11    ROS- all systems are reviewed and negative except as per HPI  Physical Exam: Filed Vitals:   12/17/10 1131  BP: 154/86  Pulse: 80  Height: 5\' 4"  (  1.626 m)  Weight: 167 lb (75.751 kg)    GEN- The patient is elderly appearing, alert and oriented x 3 today.   Head- normocephalic, atraumatic Eyes-  Sclera clear, conjunctiva pink Ears- hearing intact Oropharynx- clear Neck- supple, no JVP Lymph- no cervical lymphadenopathy Lungs- Clear to ausculation bilaterally, normal work of breathing Chest- R sided pacemaker pocket is well healed Heart- Regular rate and rhythm, no murmurs, rubs or gallops, PMI not laterally displaced GI- soft, NT, ND, + BS Extremities- no clubbing, cyanosis, 1+ BLE edema MS- age appropriate muscle atrophy Skin- no rash or lesion Psych- euthymic mood, full affect Neuro- strength and sensation are intact  Pacemaker interrogation- reviewed in detail today,  See PACEART report  Assessment and Plan:

## 2011-01-29 ENCOUNTER — Encounter: Payer: Self-pay | Admitting: Internal Medicine

## 2011-02-27 ENCOUNTER — Other Ambulatory Visit: Payer: Self-pay | Admitting: Internal Medicine

## 2011-02-27 MED ORDER — DABIGATRAN ETEXILATE MESYLATE 150 MG PO CAPS: 150.0000 mg | ORAL_CAPSULE | Freq: Two times a day (BID) | ORAL | Status: DC

## 2011-04-07 ENCOUNTER — Encounter: Payer: Self-pay | Admitting: Cardiovascular Disease

## 2011-04-07 ENCOUNTER — Encounter (INDEPENDENT_AMBULATORY_CARE_PROVIDER_SITE_OTHER): Payer: Medicare Other

## 2011-04-07 DIAGNOSIS — R0989 Other specified symptoms and signs involving the circulatory and respiratory systems: Secondary | ICD-10-CM

## 2011-05-11 ENCOUNTER — Ambulatory Visit: Payer: Medicare Other | Admitting: Cardiovascular Disease

## 2011-06-01 ENCOUNTER — Ambulatory Visit: Payer: Medicare Other | Admitting: Cardiovascular Disease

## 2011-06-16 ENCOUNTER — Ambulatory Visit: Payer: Medicare Other | Admitting: Cardiovascular Disease

## 2011-06-23 ENCOUNTER — Ambulatory Visit: Payer: Medicare Other | Admitting: Internal Medicine

## 2011-07-15 ENCOUNTER — Encounter: Payer: Self-pay | Admitting: Internal Medicine

## 2011-07-15 ENCOUNTER — Ambulatory Visit (INDEPENDENT_AMBULATORY_CARE_PROVIDER_SITE_OTHER): Payer: Medicare Other | Admitting: Internal Medicine

## 2011-07-15 VITALS — BP 150/85 | HR 90 | Ht 64.0 in | Wt 162.0 lb

## 2011-07-15 DIAGNOSIS — I495 Sick sinus syndrome: Secondary | ICD-10-CM

## 2011-07-15 DIAGNOSIS — I10 Essential (primary) hypertension: Secondary | ICD-10-CM

## 2011-07-15 DIAGNOSIS — I4891 Unspecified atrial fibrillation: Secondary | ICD-10-CM

## 2011-07-15 DIAGNOSIS — I5033 Acute on chronic diastolic (congestive) heart failure: Secondary | ICD-10-CM

## 2011-07-15 LAB — PACEMAKER DEVICE OBSERVATION
AL IMPEDENCE PM: 523 Ohm
AL THRESHOLD: 0.5 V
ATRIAL PACING PM: 77
BATTERY VOLTAGE: 2.75 V

## 2011-07-15 NOTE — Assessment & Plan Note (Signed)
She is in sinus rhythm today. She will continue her current medical therapy.

## 2011-07-15 NOTE — Progress Notes (Signed)
HPI [name] is referred today for ongoing evaluation and management of atrial fibrillation and symptomatic bradycardia status post pacemaker insertion. The patient is a very pleasant 78 year old woman who had seen Dr. Dickie La in the past. She has chronic fatigue but still gets around fairly well. She denies chest pain. No dyspnea. She does not experience palpitations. No syncope. No Known Allergies   Current Outpatient Prescriptions  Medication Sig Dispense Refill  . ALPRAZolam (XANAX) 0.25 MG tablet prn      . Calcium Carbonate-Vitamin D (CALCIUM + D PO) Take by mouth daily.        . dabigatran (PRADAXA) 150 MG CAPS Take 1 capsule (150 mg total) by mouth 2 (two) times daily.  60 capsule  12  . furosemide (LASIX) 40 MG tablet Take 40 mg by mouth daily as needed.       Marland Kitchen levothyroxine (SYNTHROID) 88 MCG tablet Take 88 mcg by mouth every other day. Alternating with 100mg  every other day      . losartan (COZAAR) 50 MG tablet Take 1 tablet (50 mg total) by mouth daily.  30 tablet  11  . omeprazole (PRILOSEC) 20 MG capsule Take 20 mg by mouth 2 (two) times daily.       . vitamin B-12 (CYANOCOBALAMIN) 1000 MCG tablet Take 1,000 mcg by mouth daily.           Past Medical History  Diagnosis Date  . Sick sinus syndrome     s/p PPM by Dr Juanda Chance (MDT)  . Hypertension   . FHx: mastectomy     for breast cancer  . Persistent atrial fibrillation     on pradaxa  . Gastro - esophageal reflux disease   . Allergic rhinitis due to pollen   . Other specified acquired hypothyroidism   . Body mass index 28.0-28.9, adult   . Other and unspecified hyperlipidemia   . Encounter for long-term (current) use of other medications   . Other B-complex deficiencies   . Memory loss   . Pain in limb   . Dizziness and giddiness   . Diastolic dysfunction     ROS:   All systems reviewed and negative except as noted in the HPI.   Past Surgical History  Procedure Date  . Pacemaker insertion     by Dr Juanda Chance  for tachy/brady syndrome (MDT)  . Mastectomy   . Tonsillectomy   . Appendectomy      Family History  Problem Relation Age of Onset  . Heart disease Maternal Grandfather   . Diabetes Sister   . Tuberculosis Mother     father  . Diabetes Brother   . Stroke Brother      History   Social History  . Marital Status: Widowed    Spouse Name: N/A    Number of Children: 2  . Years of Education: N/A   Occupational History  . Retired    Social History Main Topics  . Smoking status: Never Smoker   . Smokeless tobacco: Not on file  . Alcohol Use: No  . Drug Use: No  . Sexually Active: Not on file   Other Topics Concern  . Not on file   Social History Narrative   Participates in a senior games.       BP 150/85  Pulse 90  Ht 5\' 4"  (1.626 m)  Wt 162 lb (73.483 kg)  BMI 27.81 kg/m2  Physical Exam:  Well appearing elderly woman, NAD HEENT: Unremarkable Neck:  No  JVD, no thyromegally Lungs:  Clear with no wheezes. Well-healed pacemaker incision. HEART:  Regular rate rhythm, no murmurs, no rubs, no clicks Abd:  soft, positive bowel sounds, no organomegally, no rebound, no guarding Ext:  2 plus pulses, no edema, no cyanosis, no clubbing Skin:  No rashes no nodules Neuro:  CN II through XII intact, motor grossly intact  DEVICE  Normal device function.  See PaceArt for details.   Assess/Plan:

## 2011-07-15 NOTE — Assessment & Plan Note (Signed)
Her heart failure symptoms are well controlled. She is instructed to maintain a low-sodium diet and continue her current medical therapy.

## 2011-07-15 NOTE — Assessment & Plan Note (Signed)
Her blood pressure today is slightly elevated. She is instructed to reduce her sodium intake. She may require up titration of her medical therapy but I would be reluctant to do this on her initial visit today.

## 2011-07-15 NOTE — Patient Instructions (Signed)
Your physician wants you to follow-up in: 6 months with Dr Taylor You will receive a reminder letter in the mail two months in advance. If you don't receive a letter, please call our office to schedule the follow-up appointment.  

## 2011-10-08 ENCOUNTER — Other Ambulatory Visit: Payer: Self-pay | Admitting: Family Medicine

## 2011-10-08 DIAGNOSIS — Z1231 Encounter for screening mammogram for malignant neoplasm of breast: Secondary | ICD-10-CM

## 2011-10-21 ENCOUNTER — Ambulatory Visit
Admission: RE | Admit: 2011-10-21 | Discharge: 2011-10-21 | Disposition: A | Discharge status: Home / Self Care | Payer: Medicare Other | Source: Ambulatory Visit | Attending: Family Medicine | Admitting: Family Medicine

## 2011-10-21 DIAGNOSIS — Z1231 Encounter for screening mammogram for malignant neoplasm of breast: Secondary | ICD-10-CM

## 2012-01-08 ENCOUNTER — Encounter: Payer: Self-pay | Admitting: *Deleted

## 2012-01-08 DIAGNOSIS — Z950 Presence of cardiac pacemaker: Secondary | ICD-10-CM | POA: Insufficient documentation

## 2012-01-14 ENCOUNTER — Ambulatory Visit (INDEPENDENT_AMBULATORY_CARE_PROVIDER_SITE_OTHER): Payer: Medicare Other | Admitting: Internal Medicine

## 2012-01-14 ENCOUNTER — Encounter: Payer: Self-pay | Admitting: Internal Medicine

## 2012-01-14 VITALS — BP 152/70 | HR 65 | Ht 64.0 in | Wt 172.0 lb

## 2012-01-14 DIAGNOSIS — I5033 Acute on chronic diastolic (congestive) heart failure: Secondary | ICD-10-CM

## 2012-01-14 DIAGNOSIS — Z950 Presence of cardiac pacemaker: Secondary | ICD-10-CM

## 2012-01-14 DIAGNOSIS — I4891 Unspecified atrial fibrillation: Secondary | ICD-10-CM

## 2012-01-14 LAB — PACEMAKER DEVICE OBSERVATION
AL AMPLITUDE: 1 mv
AL IMPEDENCE PM: 472 Ohm
AL THRESHOLD: 0.75 V
RV LEAD AMPLITUDE: 22.4 mv

## 2012-01-14 NOTE — Assessment & Plan Note (Signed)
Her Medtronic dual-chamber pacemaker is working normally. She has approximately 3 years of battery longevity. We'll see her back in several months.

## 2012-01-14 NOTE — Progress Notes (Signed)
HPI [name] returns today for followup. She is a very pleasant 78 year old woman, who looks younger than her stated age, who is status post permanent pacemaker insertion secondary to bradycardia. She also has a history of atrial fibrillation which has been well-controlled. She is on chronic anticoagulation. Over the last several weeks, she has noted weakness in her leg and the sensation of unsteadiness in her legs. She is being evaluated for this by her primary physician, Dr. Samuel Germany. She denies syncope or near-syncope. No chest pain or shortness of breath. No peripheral edema. No Known Allergies   Current Outpatient Prescriptions  Medication Sig Dispense Refill  . ALPRAZolam (XANAX) 0.25 MG tablet prn      . Calcium Carbonate-Vitamin D (CALCIUM + D PO) Take by mouth daily.        . dabigatran (PRADAXA) 150 MG CAPS Take 1 capsule (150 mg total) by mouth 2 (two) times daily.  60 capsule  12  . diltiazem (TIAZAC) 180 MG 24 hr capsule 180 mg daily.       . furosemide (LASIX) 40 MG tablet Take 40 mg by mouth daily as needed.       Marland Kitchen levothyroxine (SYNTHROID) 88 MCG tablet Take 88 mcg by mouth every other day. Alternating with 100mg  every other day      . losartan (COZAAR) 50 MG tablet Take 1 tablet (50 mg total) by mouth daily.  30 tablet  11  . omeprazole (PRILOSEC) 20 MG capsule Take 20 mg by mouth 2 (two) times daily.       . vitamin B-12 (CYANOCOBALAMIN) 1000 MCG tablet Take 1,000 mcg by mouth daily.           Past Medical History  Diagnosis Date  . Sick sinus syndrome     s/p PPM by Dr Juanda Chance (MDT)  . Hypertension   . FHx: mastectomy     for breast cancer  . Persistent atrial fibrillation     on pradaxa  . Gastro - esophageal reflux disease   . Allergic rhinitis due to pollen   . Other specified acquired hypothyroidism   . Body mass index 28.0-28.9, adult   . Other and unspecified hyperlipidemia   . Encounter for long-term (current) use of other medications   . Other B-complex  deficiencies   . Memory loss   . Pain in limb   . Dizziness and giddiness   . Diastolic dysfunction     ROS:   All systems reviewed and negative except as noted in the HPI.   Past Surgical History  Procedure Date  . Pacemaker insertion     by Dr Juanda Chance for tachy/brady syndrome (MDT)  . Mastectomy   . Tonsillectomy   . Appendectomy      Family History  Problem Relation Age of Onset  . Heart disease Maternal Grandfather   . Diabetes Sister   . Tuberculosis Mother     father  . Diabetes Brother   . Stroke Brother      History   Social History  . Marital Status: Widowed    Spouse Name: N/A    Number of Children: 2  . Years of Education: N/A   Occupational History  . Retired    Social History Main Topics  . Smoking status: Never Smoker   . Smokeless tobacco: Not on file  . Alcohol Use: No  . Drug Use: No  . Sexually Active: Not on file   Other Topics Concern  . Not on file  Social History Narrative   Participates in a senior games.       BP 152/70  Pulse 65  Ht 5\' 4"  (1.626 m)  Wt 172 lb (78.019 kg)  BMI 29.52 kg/m2  Physical Exam:  Well appearing elderly woman, NAD HEENT: Unremarkable Neck:  7 cm JVD, no thyromegally Lungs:  Clear with no wheezes, rales, or rhonchi. Well-healed pacemaker incision. HEART:  Regular rate rhythm, no murmurs, no rubs, no clicks Abd:  soft, positive bowel sounds, no organomegally, no rebound, no guarding Ext:  2 plus pulses, no edema, no cyanosis, no clubbing Skin:  No rashes no nodules Neuro:  CN II through XII intact, motor grossly intact  DEVICE  Normal device function.  See PaceArt for details.   Assess/Plan:

## 2012-01-14 NOTE — Assessment & Plan Note (Signed)
Her current symptoms are class II. She will continue her current medical therapy and maintain a low-sodium diet.

## 2012-01-14 NOTE — Patient Instructions (Signed)
Your physician wants you to follow-up in: 6 months in the device clinic and 12 months with Dr Taylor You will receive a reminder letter in the mail two months in advance. If you don't receive a letter, please call our office to schedule the follow-up appointment.  

## 2012-01-14 NOTE — Assessment & Plan Note (Signed)
She is currently maintaining sinus rhythm very nicely. Pacemaker interrogation demonstrates 99.7% sinus rhythm.

## 2012-01-15 ENCOUNTER — Telehealth: Payer: Self-pay | Admitting: Internal Medicine

## 2012-01-15 MED ORDER — DABIGATRAN ETEXILATE MESYLATE 150 MG PO CAPS: 150.0000 mg | ORAL_CAPSULE | Freq: Two times a day (BID) | ORAL | Status: DC

## 2012-01-15 NOTE — Telephone Encounter (Signed)
Rx sent in to [facility]

## 2012-01-15 NOTE — Telephone Encounter (Signed)
Pt needs her new Rx for prodaxa sent in to [facility] in [place] for the new dose

## 2012-01-20 ENCOUNTER — Telehealth: Payer: Self-pay | Admitting: Internal Medicine

## 2012-01-20 NOTE — Telephone Encounter (Signed)
I have called in the dose she needs which is the 150mg  bid not he 75mg  bid  Pharmacy is aware and will contact the patient

## 2012-01-20 NOTE — Telephone Encounter (Signed)
plz return call to [facility]  [phone] option #3 regarding dosage instruction for Pradaxa.

## 2012-07-13 ENCOUNTER — Ambulatory Visit (INDEPENDENT_AMBULATORY_CARE_PROVIDER_SITE_OTHER): Payer: Medicare Other | Admitting: *Deleted

## 2012-07-13 DIAGNOSIS — I4891 Unspecified atrial fibrillation: Secondary | ICD-10-CM

## 2012-07-13 LAB — PACEMAKER DEVICE OBSERVATION
AL IMPEDENCE PM: 543 Ohm
AL THRESHOLD: 0.75 V
BATTERY VOLTAGE: 2.73 V
VENTRICULAR PACING PM: 100

## 2012-07-13 NOTE — Progress Notes (Signed)
PPM check in office. 

## 2012-09-01 ENCOUNTER — Encounter: Payer: Self-pay | Admitting: Internal Medicine

## 2012-12-29 ENCOUNTER — Encounter (HOSPITAL_COMMUNITY): Payer: Self-pay | Admitting: Pulmonary Disease

## 2012-12-29 ENCOUNTER — Inpatient Hospital Stay (HOSPITAL_COMMUNITY)
Admission: EM | Admit: 2012-12-29 | Discharge: 2013-01-04 | DRG: 357 | Disposition: A | Discharge status: Skilled Nursing Facility | Payer: Medicare Other | Source: Other Acute Inpatient Hospital | Attending: Internal Medicine | Admitting: Internal Medicine

## 2012-12-29 DIAGNOSIS — R339 Retention of urine, unspecified: Secondary | ICD-10-CM | POA: Diagnosis present

## 2012-12-29 DIAGNOSIS — I82402 Acute embolism and thrombosis of unspecified deep veins of left lower extremity: Secondary | ICD-10-CM

## 2012-12-29 DIAGNOSIS — E039 Hypothyroidism, unspecified: Secondary | ICD-10-CM | POA: Diagnosis present

## 2012-12-29 DIAGNOSIS — Z7901 Long term (current) use of anticoagulants: Secondary | ICD-10-CM

## 2012-12-29 DIAGNOSIS — N280 Ischemia and infarction of kidney: Secondary | ICD-10-CM

## 2012-12-29 DIAGNOSIS — Z950 Presence of cardiac pacemaker: Secondary | ICD-10-CM

## 2012-12-29 DIAGNOSIS — I4891 Unspecified atrial fibrillation: Secondary | ICD-10-CM | POA: Diagnosis present

## 2012-12-29 DIAGNOSIS — I10 Essential (primary) hypertension: Secondary | ICD-10-CM | POA: Diagnosis present

## 2012-12-29 DIAGNOSIS — K219 Gastro-esophageal reflux disease without esophagitis: Secondary | ICD-10-CM | POA: Diagnosis present

## 2012-12-29 DIAGNOSIS — K224 Dyskinesia of esophagus: Secondary | ICD-10-CM | POA: Diagnosis present

## 2012-12-29 DIAGNOSIS — F3289 Other specified depressive episodes: Secondary | ICD-10-CM | POA: Diagnosis present

## 2012-12-29 DIAGNOSIS — I743 Embolism and thrombosis of arteries of the lower extremities: Secondary | ICD-10-CM | POA: Diagnosis present

## 2012-12-29 DIAGNOSIS — I774 Celiac artery compression syndrome: Principal | ICD-10-CM | POA: Diagnosis present

## 2012-12-29 DIAGNOSIS — Z853 Personal history of malignant neoplasm of breast: Secondary | ICD-10-CM

## 2012-12-29 DIAGNOSIS — F329 Major depressive disorder, single episode, unspecified: Secondary | ICD-10-CM | POA: Diagnosis present

## 2012-12-29 DIAGNOSIS — K5900 Constipation, unspecified: Secondary | ICD-10-CM | POA: Diagnosis not present

## 2012-12-29 DIAGNOSIS — R1310 Dysphagia, unspecified: Secondary | ICD-10-CM | POA: Diagnosis present

## 2012-12-29 DIAGNOSIS — J301 Allergic rhinitis due to pollen: Secondary | ICD-10-CM | POA: Diagnosis present

## 2012-12-29 DIAGNOSIS — R413 Other amnesia: Secondary | ICD-10-CM | POA: Diagnosis present

## 2012-12-29 DIAGNOSIS — I748 Embolism and thrombosis of other arteries: Secondary | ICD-10-CM

## 2012-12-29 DIAGNOSIS — I495 Sick sinus syndrome: Secondary | ICD-10-CM | POA: Diagnosis present

## 2012-12-29 DIAGNOSIS — F411 Generalized anxiety disorder: Secondary | ICD-10-CM | POA: Diagnosis present

## 2012-12-29 DIAGNOSIS — R7309 Other abnormal glucose: Secondary | ICD-10-CM | POA: Diagnosis present

## 2012-12-29 DIAGNOSIS — N2889 Other specified disorders of kidney and ureter: Secondary | ICD-10-CM

## 2012-12-29 DIAGNOSIS — E538 Deficiency of other specified B group vitamins: Secondary | ICD-10-CM | POA: Diagnosis present

## 2012-12-29 DIAGNOSIS — E785 Hyperlipidemia, unspecified: Secondary | ICD-10-CM | POA: Diagnosis present

## 2012-12-29 DIAGNOSIS — I509 Heart failure, unspecified: Secondary | ICD-10-CM | POA: Diagnosis present

## 2012-12-29 DIAGNOSIS — I5032 Chronic diastolic (congestive) heart failure: Secondary | ICD-10-CM | POA: Diagnosis present

## 2012-12-29 DIAGNOSIS — D72829 Elevated white blood cell count, unspecified: Secondary | ICD-10-CM | POA: Diagnosis present

## 2012-12-29 DIAGNOSIS — E038 Other specified hypothyroidism: Secondary | ICD-10-CM | POA: Diagnosis present

## 2012-12-29 DIAGNOSIS — I5033 Acute on chronic diastolic (congestive) heart failure: Secondary | ICD-10-CM

## 2012-12-29 DIAGNOSIS — E876 Hypokalemia: Secondary | ICD-10-CM | POA: Diagnosis not present

## 2012-12-29 DIAGNOSIS — I119 Hypertensive heart disease without heart failure: Secondary | ICD-10-CM

## 2012-12-29 LAB — CBC WITH DIFFERENTIAL/PLATELET
Basophils Absolute: 0 10*3/uL (ref 0.0–0.1)
Basophils Relative: 0 % (ref 0–1)
Eosinophils Absolute: 0 10*3/uL (ref 0.0–0.7)
HCT: 38.7 % (ref 36.0–46.0)
Hemoglobin: 13.4 g/dL (ref 12.0–15.0)
Lymphs Abs: 1.2 10*3/uL (ref 0.7–4.0)
MCHC: 34.6 g/dL (ref 30.0–36.0)
MCV: 91.9 fL (ref 78.0–100.0)
Neutro Abs: 11.1 10*3/uL — ABNORMAL HIGH (ref 1.7–7.7)
Neutrophils Relative %: 82 % — ABNORMAL HIGH (ref 43–77)
Platelets: 165 10*3/uL (ref 150–400)
RBC: 4.21 MIL/uL (ref 3.87–5.11)
RDW: 17 % — ABNORMAL HIGH (ref 11.5–15.5)
WBC: 13.5 10*3/uL — ABNORMAL HIGH (ref 4.0–10.5)

## 2012-12-29 LAB — PHOSPHORUS: Phosphorus: 3.1 mg/dL (ref 2.3–4.6)

## 2012-12-29 LAB — COMPREHENSIVE METABOLIC PANEL
AST: 196 U/L — ABNORMAL HIGH (ref 0–37)
Albumin: 3.2 g/dL — ABNORMAL LOW (ref 3.5–5.2)
Alkaline Phosphatase: 83 U/L (ref 39–117)
BUN: 17 mg/dL (ref 6–23)
CO2: 27 mEq/L (ref 19–32)
Calcium: 10.6 mg/dL — ABNORMAL HIGH (ref 8.4–10.5)
Chloride: 99 mEq/L (ref 96–112)
GFR calc Af Amer: 52 mL/min — ABNORMAL LOW (ref >90)
GFR calc non Af Amer: 45 mL/min — ABNORMAL LOW (ref >90)
Potassium: 3.6 mEq/L (ref 3.5–5.1)
Total Bilirubin: 0.8 mg/dL (ref 0.3–1.2)
Total Protein: 6.5 g/dL (ref 6.0–8.3)

## 2012-12-29 LAB — APTT: aPTT: 116 seconds — ABNORMAL HIGH (ref 24–37)

## 2012-12-29 LAB — LACTIC ACID, PLASMA: Lactic Acid, Venous: 3 mmol/L — ABNORMAL HIGH (ref 0.5–2.2)

## 2012-12-29 LAB — MRSA PCR SCREENING: MRSA by PCR: NEGATIVE

## 2012-12-29 LAB — PROTIME-INR: Prothrombin Time: 16.6 seconds — ABNORMAL HIGH (ref 11.6–15.2)

## 2012-12-29 LAB — GLUCOSE, CAPILLARY: Glucose-Capillary: 238 mg/dL — ABNORMAL HIGH (ref 70–99)

## 2012-12-29 MED ORDER — SODIUM CHLORIDE 0.9 % IV SOLN: INTRAVENOUS | Status: DC

## 2012-12-29 MED ORDER — HEPARIN (PORCINE) IN NACL 100-0.45 UNIT/ML-% IJ SOLN
950.0000 [IU]/h | INTRAMUSCULAR | Status: DC
  Filled 2012-12-29: qty 250

## 2012-12-29 MED ORDER — PANTOPRAZOLE SODIUM 40 MG IV SOLR
40.0000 mg | INTRAVENOUS | Status: DC
  Administered 2012-12-29 – 2012-12-30 (×2): 40 mg via INTRAVENOUS
  Filled 2012-12-29 (×4): qty 40

## 2012-12-29 NOTE — Progress Notes (Signed)
eLink Physician-Brief Progress Note Patient Name: [name] DOB: January 25, 1922 MRN: [id number]  Date of Service  12/29/2012   HPI/Events of Note  Spoke to son [name] expressed that mom's wishes are not to be place d on mch ventilation   eICU Interventions  DNI issued - discussion to be continued based on course   Intervention Category Major Interventions: End of life / care limitation discussion  Caelen Higinbotham V. 12/29/2012, 10:12 PM

## 2012-12-29 NOTE — H&P (Signed)
PULMONARY  / CRITICAL CARE MEDICINE  Name: [name] MRN: [id number] DOB: [birth year]    ADMISSION DATE:  12/29/2012 CONSULTATION DATE:  12/29/2012  REFERRING MD :  [facility] ED PRIMARY SERVICE:  PCCM  CHIEF COMPLAINT:  Abdominal pain  BRIEF PATIENT DESCRIPTION: 79 yo with past medical history of HTN brought to [facility] ED with LLQ abdominal pain. 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  Vascular surgery was consulted and she was transferred to [facility] for further management.  SIGNIFICANT EVENTS / STUDIES:  11/20  Presented with LLQ abdominal pain 11/20  CT abdomen >>> Infarcted L kidney, thrombus in left renal / celiac artery, biliary dilation likely due to prior cholecystectomy 11/20  Transferred to [facility]  LINES / TUBES:  CULTURES:   ANTIBIOTICS:  HISTORY OF PRESENT ILLNESS:  79 yo with past medical history of HTN brought to [facility] ED with LLQ abdominal pain with radiation to left lower back x 2 weeks. She was also constipated. She also had decreased oral intake but denies nausea, vomiting or diarrhea.  She did not have fever or urinary symptoms.  There was no cough, congestion or fever.  She also reported some weight loss. 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  Vascular surgery was consulted and she was transferred to [facility] for further management.  PAST MEDICAL HISTORY :  Past Medical History  Diagnosis Date  . Sick sinus syndrome     s/p PPM by Dr Juanda Chance (MDT)  . Hypertension   . FHx: mastectomy     for breast cancer  . Persistent atrial fibrillation     on pradaxa  . Gastro - esophageal reflux disease   . Allergic rhinitis due to pollen   . Other specified acquired hypothyroidism   . Body mass index 28.0-28.9, adult   . Other and unspecified hyperlipidemia   . Encounter for long-term (current) use of other medications   . Other B-complex deficiencies   . Memory loss   . Pain in limb   .  Dizziness and giddiness   . Diastolic dysfunction    Past Surgical History  Procedure Laterality Date  . Pacemaker insertion      by Dr Juanda Chance for tachy/brady syndrome (MDT)  . Mastectomy    . Tonsillectomy    . Appendectomy     Prior to Admission medications   Medication Sig Start Date End Date Taking? Authorizing Provider  ALPRAZolam Prudy Feeler) 0.25 MG tablet prn 06/25/11   Historical Provider, MD  Calcium Carbonate-Vitamin D (CALCIUM + D PO) Take by mouth daily.      Historical Provider, MD  dabigatran (PRADAXA) 150 MG CAPS Take 1 capsule (150 mg total) by mouth 2 (two) times daily. 01/15/12   Hillis Range, MD  diltiazem (TIAZAC) 180 MG 24 hr capsule 180 mg daily.  11/25/11   Historical Provider, MD  furosemide (LASIX) 40 MG tablet Take 40 mg by mouth daily as needed.  12/01/10   Historical Provider, MD  levothyroxine (SYNTHROID) 88 MCG tablet Take 88 mcg by mouth every other day. Alternating with 100mg  every other day    Historical Provider, MD  losartan (COZAAR) 50 MG tablet Take 1 tablet (50 mg total) by mouth daily. 12/17/10 02/13/12  Hillis Range, MD  omeprazole (PRILOSEC) 20 MG capsule Take 20 mg by mouth 2 (two) times daily.     Historical Provider, MD  vitamin B-12 (CYANOCOBALAMIN) 1000 MCG tablet Take 1,000 mcg  by mouth daily.      Historical Provider, MD   No Known Allergies  FAMILY HISTORY:  Family History  Problem Relation Age of Onset  . Heart disease Maternal Grandfather   . Diabetes Sister   . Tuberculosis Mother     father  . Diabetes Brother   . Stroke Brother    SOCIAL HISTORY:  reports that she has never smoked. She does not have any smokeless tobacco history on file. She reports that she does not drink alcohol or use illicit drugs.  REVIEW OF SYSTEMS:   Review of Systems  Constitutional: Positive for weight loss and malaise/fatigue. Negative for fever, chills and diaphoresis.  HENT: Negative for ear discharge, ear pain and nosebleeds.   Eyes: Positive for  discharge. Negative for double vision, photophobia and pain.  Respiratory: Negative for cough, hemoptysis, sputum production, shortness of breath, wheezing and stridor.   Cardiovascular: Negative for chest pain, palpitations, orthopnea, leg swelling and PND.  Gastrointestinal: Positive for abdominal pain and constipation. Negative for heartburn, nausea, vomiting, diarrhea, blood in stool and melena.  Genitourinary: Positive for flank pain. Negative for dysuria, urgency and frequency.  Musculoskeletal: Negative.  Negative for back pain, joint pain, myalgias and neck pain.  Skin: Negative for itching and rash.  Neurological: Positive for weakness. Negative for dizziness, tingling, tremors, sensory change, speech change, seizures and headaches.  Endo/Heme/Allergies: Negative for environmental allergies and polydipsia. Does not bruise/bleed easily.  Psychiatric/Behavioral: Negative for depression and substance abuse. The patient is nervous/anxious.    INTERVAL HISTORY:  VITAL SIGNS:   HEMODYNAMICS:   VENTILATOR SETTINGS:   INTAKE / OUTPUT: Intake/Output   None    PHYSICAL EXAMINATION: General:  No acute distress Neuro:  Awake, alert, cooperative HEENT:  PERRL, no JVD Cardiovascular:  Paced rhythm, no murmurs Lungs:  Bilateral air entry, no added sounds Abdomen:  Soft, some tenderness LLQ / left flank, suprapubic tenderness initially - resolved with in / out cath, no rebound Musculoskeletal:  Moves all extremities, no edema Skin:  Intact  LABS: CBC  Recent Labs Lab 12/29/12 2200  WBC 13.5*  HGB 13.4  HCT 38.7  PLT 165   Coag's  Recent Labs Lab 12/29/12 2200  APTT 116*  INR 1.38   BMET  Recent Labs Lab 12/29/12 2200  NA 138  K 3.6  CL 99  CO2 27  BUN 17  CREATININE 1.06  GLUCOSE 241*   Electrolytes  Recent Labs Lab 12/29/12 2200  CALCIUM 10.6*  MG 1.9  PHOS 3.1   Sepsis Markers  Recent Labs Lab 12/29/12 2200 12/30/12 0332  LATICACIDVEN 3.0*  3.0*   ABG No results found for this basename: PHART, PCO2ART, PO2ART,  in the last 168 hours Liver Enzymes  Recent Labs Lab 12/29/12 2200  AST 196*  ALT 73*  ALKPHOS 83  BILITOT 0.8  ALBUMIN 3.2*   Cardiac Enzymes No results found for this basename: TROPONINI, PROBNP,  in the last 168 hours Glucose  Recent Labs Lab 12/29/12 2137 12/30/12 0035 12/30/12 0335  GLUCAP 238* 206* 128*    CXR:  11/20 >>> Dual lead pacer, no overt airspace disease  ASSESSMENT / PLAN:  PULMONARY A:  No active issues. P:   No intervention required  CARDIOVASCULAR A: Renal / celiac artery thrombosis. AF. Sick sinus syndrome, s/p dual chamber pacemaker.  Chronic diastolic heart failure without exacerbation. P:  Vascular aware >>> will see in AM >>> intervention unlikely Anticoagulation Trend lactate Cardizem / Cozaar when able  RENAL  A:  Left kidney infarct. Urinary retention. P:   Trend BMP NS@75  In / out cath Hold Lasix  GASTROINTESTINAL A:  Possible bowel ischemia. Elevated transaminases, etiology? P:   NPO Protonix as on PPI preadmission Trend LFT  HEMATOLOGIC A:  On Pradaxa prior to admission foe AF. P:  Trend CBC / APTT Heparin gtt per pharmacy  INFECTIOUS A:  No active issues.  P:   No intervention required  ENDOCRINE  A:  Hypothyroidism. Hyperglycemia. P:   Synthroid when able SSI  NEUROLOGIC A:  H/o anxiety. P:   Xanax PRN when able  I have personally obtained history, examined patient, evaluated and interpreted laboratory and imaging results, reviewed medical records, formulated assessment / plan and placed orders.  Lonia Farber, MD Pulmonary and Critical Care Medicine [facility] Pager: [phone]  12/29/2012, 9:31 PM

## 2012-12-29 NOTE — Progress Notes (Signed)
ANTICOAGULATION CONSULT NOTE - Initial Consult  Pharmacy Consult for heparin  Indication: Renal / Celiac artery thrombosis; Afib   No Known Allergies  Patient Measurements: Height: 5\' 4"  (162.6 cm) Weight: 135 lb 2.3 oz (61.3 kg) IBW/kg (Calculated) : 54.7 Heparin Dosing Weight: n/a  Vital Signs: Temp: 98.2 F (36.8 C) (11/20 2137) Temp src: Oral (11/20 2137) BP: 158/68 mmHg (11/20 2137) Pulse Rate: 60 (11/20 2137)  Labs: No results found for this basename: HGB, HCT, PLT, APTT, LABPROT, INR, HEPARINUNFRC, CREATININE, CKTOTAL, CKMB, TROPONINI,  in the last 72 hours  Estimated Creatinine Clearance: 41.2 ml/min (by C-G formula based on Cr of 0.8).   Medical History: Past Medical History  Diagnosis Date  . Sick sinus syndrome     s/p PPM by Dr Juanda Chance (MDT)  . Hypertension   . FHx: mastectomy     for breast cancer  . Persistent atrial fibrillation     on pradaxa  . Gastro - esophageal reflux disease   . Allergic rhinitis due to pollen   . Other specified acquired hypothyroidism   . Body mass index 28.0-28.9, adult   . Other and unspecified hyperlipidemia   . Encounter for long-term (current) use of other medications   . Other B-complex deficiencies   . Memory loss   . Pain in limb   . Dizziness and giddiness   . Diastolic dysfunction     Medications:  Prescriptions prior to admission  Medication Sig Dispense Refill  . ALPRAZolam (XANAX) 0.25 MG tablet prn      . Calcium Carbonate-Vitamin D (CALCIUM + D PO) Take by mouth daily.        . dabigatran (PRADAXA) 150 MG CAPS Take 1 capsule (150 mg total) by mouth 2 (two) times daily.  60 capsule  12  . diltiazem (TIAZAC) 180 MG 24 hr capsule 180 mg daily.       . furosemide (LASIX) 40 MG tablet Take 40 mg by mouth daily as needed.       Marland Kitchen levothyroxine (SYNTHROID) 88 MCG tablet Take 88 mcg by mouth every other day. Alternating with 100mg  every other day      . losartan (COZAAR) 50 MG tablet Take 1 tablet (50 mg total) by  mouth daily.  30 tablet  11  . omeprazole (PRILOSEC) 20 MG capsule Take 20 mg by mouth 2 (two) times daily.       . vitamin B-12 (CYANOCOBALAMIN) 1000 MCG tablet Take 1,000 mcg by mouth daily.          Assessment: 76 yof brought to [facility] ED with LLQ abdominal pain. 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 She was then transferred to [facility] for further management. Patient was on pradaxa 150 mg twice daily PTA for Afib. Per patient's relative, last dose of pradaxa was taken around 1900 on 11/19. No signs of bleeding noted. Labs pending.   PMH: HTN, Afib, Sick sinus syndrome, Memory loss, diastolic dysfunction, GERD, hypothyroidism, HLD  Goal of Therapy:  Heparin level 0.3-0.7 units/ml Monitor platelets by anticoagulation protocol: Yes APTT goal: 50-80 seconds    Plan:  1) Start heparin infusion at 950 units/hr. No bolus  2) Check anti-Xa and aPTT levels in 8 hours and daily while on heparin 3) Continue to monitor H&H and platelets 4) Monitor for signs of bleeding.   Vinnie Level, PharmD.  Clinical Pharmacist Pager [phone]

## 2012-12-30 ENCOUNTER — Encounter (HOSPITAL_COMMUNITY): Payer: Medicare Other | Admitting: Anesthesiology

## 2012-12-30 ENCOUNTER — Inpatient Hospital Stay (HOSPITAL_COMMUNITY): Payer: Medicare Other | Admitting: Anesthesiology

## 2012-12-30 ENCOUNTER — Encounter (HOSPITAL_COMMUNITY)
Admission: EM | Disposition: A | Discharge status: Skilled Nursing Facility | Payer: Self-pay | Source: Other Acute Inpatient Hospital | Attending: Internal Medicine

## 2012-12-30 DIAGNOSIS — I10 Essential (primary) hypertension: Secondary | ICD-10-CM

## 2012-12-30 DIAGNOSIS — I739 Peripheral vascular disease, unspecified: Secondary | ICD-10-CM

## 2012-12-30 DIAGNOSIS — I743 Embolism and thrombosis of arteries of the lower extremities: Secondary | ICD-10-CM

## 2012-12-30 DIAGNOSIS — M79609 Pain in unspecified limb: Secondary | ICD-10-CM

## 2012-12-30 HISTORY — PX: EMBOLECTOMY: SHX44

## 2012-12-30 LAB — GLUCOSE, CAPILLARY
Glucose-Capillary: 128 mg/dL — ABNORMAL HIGH (ref 70–99)
Glucose-Capillary: 145 mg/dL — ABNORMAL HIGH (ref 70–99)
Glucose-Capillary: 206 mg/dL — ABNORMAL HIGH (ref 70–99)

## 2012-12-30 LAB — URINALYSIS, ROUTINE W REFLEX MICROSCOPIC
Bilirubin Urine: NEGATIVE
Glucose, UA: 250 mg/dL — AB
Nitrite: NEGATIVE
Protein, ur: 30 mg/dL — AB
Urobilinogen, UA: 0.2 mg/dL (ref 0.0–1.0)

## 2012-12-30 LAB — COMPREHENSIVE METABOLIC PANEL
ALT: 70 U/L — ABNORMAL HIGH (ref 0–35)
Albumin: 3 g/dL — ABNORMAL LOW (ref 3.5–5.2)
BUN: 15 mg/dL (ref 6–23)
CO2: 22 mEq/L (ref 19–32)
Calcium: 10.7 mg/dL — ABNORMAL HIGH (ref 8.4–10.5)
Creatinine, Ser: 1.02 mg/dL (ref 0.50–1.10)
GFR calc Af Amer: 55 mL/min — ABNORMAL LOW (ref >90)
Glucose, Bld: 148 mg/dL — ABNORMAL HIGH (ref 70–99)
Sodium: 140 mEq/L (ref 135–145)
Total Protein: 6.4 g/dL (ref 6.0–8.3)

## 2012-12-30 LAB — LACTIC ACID, PLASMA
Lactic Acid, Venous: 3 mmol/L — ABNORMAL HIGH (ref 0.5–2.2)
Lactic Acid, Venous: 1.8 mmol/L (ref 0.5–2.2)
Lactic Acid, Venous: 1.9 mmol/L (ref 0.5–2.2)
Lactic Acid, Venous: 3.2 mmol/L — ABNORMAL HIGH (ref 0.5–2.2)

## 2012-12-30 LAB — CBC
HCT: 37.6 % (ref 36.0–46.0)
Hemoglobin: 13.1 g/dL (ref 12.0–15.0)
WBC: 22 10*3/uL — ABNORMAL HIGH (ref 4.0–10.5)

## 2012-12-30 LAB — APTT: aPTT: 129 seconds — ABNORMAL HIGH (ref 24–37)

## 2012-12-30 LAB — URINE MICROSCOPIC-ADD ON

## 2012-12-30 SURGERY — EMBOLECTOMY
Anesthesia: General | Site: Leg Lower | Laterality: Left | Wound class: Clean

## 2012-12-30 MED ORDER — ESCITALOPRAM OXALATE 10 MG PO TABS
10.0000 mg | ORAL_TABLET | Freq: Every day | ORAL | Status: DC
  Administered 2012-12-31 – 2013-01-04 (×5): 10 mg via ORAL
  Filled 2012-12-30 (×6): qty 1

## 2012-12-30 MED ORDER — DILTIAZEM HCL ER 180 MG PO CP24
180.0000 mg | ORAL_CAPSULE | Freq: Every day | ORAL | Status: DC
  Administered 2012-12-31 – 2013-01-04 (×5): 180 mg via ORAL
  Filled 2012-12-30 (×6): qty 1

## 2012-12-30 MED ORDER — BISACODYL 10 MG RE SUPP: 10.0000 mg | Freq: Every day | RECTAL | Status: DC | PRN

## 2012-12-30 MED ORDER — PHENOL 1.4 % MT LIQD
1.0000 | OROMUCOSAL | Status: DC | PRN
  Filled 2012-12-30: qty 177

## 2012-12-30 MED ORDER — HYDROMORPHONE HCL PF 1 MG/ML IJ SOLN: 0.5000 mg | INTRAMUSCULAR | Status: DC | PRN

## 2012-12-30 MED ORDER — DEXTROSE 5 % IV SOLN
1.5000 g | INTRAVENOUS | Status: DC | PRN
  Administered 2012-12-30: 1.5 g via INTRAVENOUS

## 2012-12-30 MED ORDER — 0.9 % SODIUM CHLORIDE (POUR BTL) OPTIME
TOPICAL | Status: DC | PRN
  Administered 2012-12-30: 2000 mL

## 2012-12-30 MED ORDER — INSULIN ASPART 100 UNIT/ML ~~LOC~~ SOLN
0.0000 [IU] | SUBCUTANEOUS | Status: DC
  Administered 2012-12-30: 3 [IU] via SUBCUTANEOUS
  Administered 2012-12-30 (×3): 2 [IU] via SUBCUTANEOUS
  Administered 2012-12-30: 3 [IU] via SUBCUTANEOUS
  Administered 2012-12-30 – 2012-12-31 (×2): 5 [IU] via SUBCUTANEOUS
  Administered 2012-12-31: 2 [IU] via SUBCUTANEOUS
  Administered 2012-12-31 (×2): 3 [IU] via SUBCUTANEOUS
  Administered 2013-01-01 (×2): 2 [IU] via SUBCUTANEOUS
  Administered 2013-01-01: 17:00:00 3 [IU] via SUBCUTANEOUS
  Administered 2013-01-01 (×2): 2 [IU] via SUBCUTANEOUS
  Administered 2013-01-02: 3 [IU] via SUBCUTANEOUS
  Administered 2013-01-02 (×3): 2 [IU] via SUBCUTANEOUS
  Administered 2013-01-04: 01:00:00 3 [IU] via SUBCUTANEOUS

## 2012-12-30 MED ORDER — LIDOCAINE HCL (CARDIAC) 20 MG/ML IV SOLN
INTRAVENOUS | Status: DC | PRN
  Administered 2012-12-30: 50 mg via INTRAVENOUS

## 2012-12-30 MED ORDER — ONDANSETRON HCL 4 MG/2ML IJ SOLN: 4.0000 mg | Freq: Four times a day (QID) | INTRAMUSCULAR | Status: DC | PRN

## 2012-12-30 MED ORDER — SUCCINYLCHOLINE CHLORIDE 20 MG/ML IJ SOLN
INTRAMUSCULAR | Status: DC | PRN
  Administered 2012-12-30: 100 mg via INTRAVENOUS

## 2012-12-30 MED ORDER — PHENYLEPHRINE HCL 10 MG/ML IJ SOLN
10.0000 mg | INTRAVENOUS | Status: DC | PRN
  Administered 2012-12-30: 20 ug/min via INTRAVENOUS

## 2012-12-30 MED ORDER — DOCUSATE SODIUM 100 MG PO CAPS
100.0000 mg | ORAL_CAPSULE | Freq: Every day | ORAL | Status: DC
  Administered 2012-12-31 – 2013-01-03 (×4): 100 mg via ORAL
  Filled 2012-12-30 (×5): qty 1

## 2012-12-30 MED ORDER — PROPOFOL 10 MG/ML IV BOLUS
INTRAVENOUS | Status: DC | PRN
  Administered 2012-12-30: 90 mg via INTRAVENOUS

## 2012-12-30 MED ORDER — ROCURONIUM BROMIDE 100 MG/10ML IV SOLN
INTRAVENOUS | Status: DC | PRN
  Administered 2012-12-30: 10 mg via INTRAVENOUS
  Administered 2012-12-30: 20 mg via INTRAVENOUS

## 2012-12-30 MED ORDER — LACTATED RINGERS IV SOLN
INTRAVENOUS | Status: DC | PRN
  Administered 2012-12-30 (×2): via INTRAVENOUS

## 2012-12-30 MED ORDER — NEOSTIGMINE METHYLSULFATE 1 MG/ML IJ SOLN
INTRAMUSCULAR | Status: DC | PRN
  Administered 2012-12-30: 3 mg via INTRAVENOUS

## 2012-12-30 MED ORDER — ACETAMINOPHEN 650 MG RE SUPP: 325.0000 mg | RECTAL | Status: DC | PRN

## 2012-12-30 MED ORDER — OXYCODONE HCL 5 MG PO TABS: 5.0000 mg | ORAL_TABLET | Freq: Once | ORAL | Status: AC | PRN

## 2012-12-30 MED ORDER — SODIUM CHLORIDE 0.9 % IV SOLN
INTRAVENOUS | Status: DC
  Administered 2012-12-30: 21:00:00 via INTRAVENOUS

## 2012-12-30 MED ORDER — GUAIFENESIN-DM 100-10 MG/5ML PO SYRP
15.0000 mL | ORAL_SOLUTION | ORAL | Status: DC | PRN
  Filled 2012-12-30: qty 15

## 2012-12-30 MED ORDER — FENTANYL CITRATE 0.05 MG/ML IJ SOLN
INTRAMUSCULAR | Status: DC | PRN
  Administered 2012-12-30: 50 ug via INTRAVENOUS
  Administered 2012-12-30: 100 ug via INTRAVENOUS

## 2012-12-30 MED ORDER — ACETAMINOPHEN 325 MG PO TABS
325.0000 mg | ORAL_TABLET | ORAL | Status: DC | PRN
  Administered 2013-01-04: 650 mg via ORAL
  Filled 2012-12-30: qty 2

## 2012-12-30 MED ORDER — HYDRALAZINE HCL 20 MG/ML IJ SOLN: 5.0000 mg | Freq: Four times a day (QID) | INTRAMUSCULAR | Status: DC | PRN

## 2012-12-30 MED ORDER — METOPROLOL TARTRATE 1 MG/ML IV SOLN: 2.0000 mg | INTRAVENOUS | Status: DC | PRN

## 2012-12-30 MED ORDER — LOSARTAN POTASSIUM 50 MG PO TABS
50.0000 mg | ORAL_TABLET | Freq: Every day | ORAL | Status: DC
  Administered 2012-12-31 – 2013-01-01 (×2): 50 mg via ORAL
  Filled 2012-12-30 (×3): qty 1

## 2012-12-30 MED ORDER — GLYCOPYRROLATE 0.2 MG/ML IJ SOLN
INTRAMUSCULAR | Status: DC | PRN
  Administered 2012-12-30: 0.4 mg via INTRAVENOUS

## 2012-12-30 MED ORDER — MORPHINE SULFATE 2 MG/ML IJ SOLN
2.0000 mg | INTRAMUSCULAR | Status: DC | PRN
  Administered 2012-12-30 (×2): 2 mg via INTRAVENOUS
  Filled 2012-12-30 (×2): qty 1

## 2012-12-30 MED ORDER — LABETALOL HCL 5 MG/ML IV SOLN
10.0000 mg | INTRAVENOUS | Status: DC | PRN
  Filled 2012-12-30: qty 4

## 2012-12-30 MED ORDER — HEPARIN (PORCINE) IN NACL 100-0.45 UNIT/ML-% IJ SOLN
750.0000 [IU]/h | INTRAMUSCULAR | Status: DC
  Filled 2012-12-30: qty 250

## 2012-12-30 MED ORDER — DEXTROSE 5 % IV SOLN
1.5000 g | INTRAVENOUS | Status: DC
  Filled 2012-12-30: qty 1.5

## 2012-12-30 MED ORDER — HEPARIN SODIUM (PORCINE) 1000 UNIT/ML IJ SOLN
INTRAMUSCULAR | Status: DC | PRN
  Administered 2012-12-30: 3000 [IU] via INTRAVENOUS

## 2012-12-30 MED ORDER — ONDANSETRON HCL 4 MG/2ML IJ SOLN
INTRAMUSCULAR | Status: DC | PRN
  Administered 2012-12-30: 4 mg via INTRAVENOUS

## 2012-12-30 MED ORDER — POLYETHYLENE GLYCOL 3350 17 G PO PACK
17.0000 g | PACK | Freq: Every day | ORAL | Status: DC | PRN
  Filled 2012-12-30: qty 1

## 2012-12-30 MED ORDER — HEMOSTATIC AGENTS (NO CHARGE) OPTIME
TOPICAL | Status: DC | PRN
  Administered 2012-12-30: 1 via TOPICAL

## 2012-12-30 MED ORDER — ONDANSETRON HCL 4 MG/2ML IJ SOLN: 4.0000 mg | Freq: Once | INTRAMUSCULAR | Status: AC | PRN

## 2012-12-30 MED ORDER — OXYCODONE HCL 5 MG/5ML PO SOLN: 5.0000 mg | Freq: Once | ORAL | Status: AC | PRN

## 2012-12-30 MED ORDER — SODIUM CHLORIDE 0.9 % IV SOLN: 500.0000 mL | Freq: Once | INTRAVENOUS | Status: AC | PRN

## 2012-12-30 MED ORDER — OXYCODONE HCL 5 MG PO TABS
5.0000 mg | ORAL_TABLET | ORAL | Status: DC | PRN
  Administered 2013-01-01: 5 mg via ORAL
  Filled 2012-12-30: qty 1

## 2012-12-30 MED ORDER — HYDROMORPHONE HCL PF 1 MG/ML IJ SOLN: 0.2500 mg | INTRAMUSCULAR | Status: DC | PRN

## 2012-12-30 MED ORDER — DEXTROSE 5 % IV SOLN
1.5000 g | Freq: Two times a day (BID) | INTRAVENOUS | Status: AC
  Administered 2012-12-30 – 2012-12-31 (×2): 1.5 g via INTRAVENOUS
  Filled 2012-12-30 (×2): qty 1.5

## 2012-12-30 MED ORDER — SODIUM CHLORIDE 0.9 % IR SOLN
Status: DC | PRN
  Administered 2012-12-30: 17:00:00

## 2012-12-30 MED ORDER — LEVOTHYROXINE SODIUM 100 MCG PO TABS
100.0000 ug | ORAL_TABLET | Freq: Every day | ORAL | Status: DC
  Administered 2012-12-31 – 2013-01-04 (×4): 100 ug via ORAL
  Filled 2012-12-30 (×7): qty 1

## 2012-12-30 MED ORDER — HEPARIN (PORCINE) IN NACL 100-0.45 UNIT/ML-% IJ SOLN
750.0000 [IU]/h | INTRAMUSCULAR | Status: DC
  Administered 2012-12-31: 750 [IU]/h via INTRAVENOUS
  Filled 2012-12-30: qty 250

## 2012-12-30 SURGICAL SUPPLY — 65 items
BANDAGE ELASTIC 4 VELCRO ST LF (GAUZE/BANDAGES/DRESSINGS) IMPLANT
BANDAGE ESMARK 6X9 LF (GAUZE/BANDAGES/DRESSINGS) IMPLANT
BNDG ESMARK 6X9 LF (GAUZE/BANDAGES/DRESSINGS)
CANISTER SUCTION 2500CC (MISCELLANEOUS) ×2 IMPLANT
CATH EMB 3FR 80CM (CATHETERS) ×6 IMPLANT
CATH EMB 4FR 80CM (CATHETERS) ×2 IMPLANT
CATH EMB 5FR 80CM (CATHETERS) IMPLANT
CLIP TI MEDIUM 24 (CLIP) ×2 IMPLANT
CLIP TI WIDE RED SMALL 24 (CLIP) ×2 IMPLANT
COVER SURGICAL LIGHT HANDLE (MISCELLANEOUS) ×2 IMPLANT
CUFF TOURNIQUET SINGLE 24IN (TOURNIQUET CUFF) IMPLANT
CUFF TOURNIQUET SINGLE 34IN LL (TOURNIQUET CUFF) IMPLANT
CUFF TOURNIQUET SINGLE 44IN (TOURNIQUET CUFF) IMPLANT
DERMABOND ADVANCED (GAUZE/BANDAGES/DRESSINGS) ×1
DERMABOND ADVANCED .7 DNX12 (GAUZE/BANDAGES/DRESSINGS) ×1 IMPLANT
DRAIN CHANNEL 15F RND FF W/TCR (WOUND CARE) IMPLANT
DRAPE WARM FLUID 44X44 (DRAPE) ×2 IMPLANT
DRAPE X-RAY CASS 24X20 (DRAPES) IMPLANT
DRSG COVADERM 4X10 (GAUZE/BANDAGES/DRESSINGS) IMPLANT
DRSG COVADERM 4X8 (GAUZE/BANDAGES/DRESSINGS) IMPLANT
ELECT REM PT RETURN 9FT ADLT (ELECTROSURGICAL) ×2
ELECTRODE REM PT RTRN 9FT ADLT (ELECTROSURGICAL) ×1 IMPLANT
EVACUATOR SILICONE 100CC (DRAIN) IMPLANT
GLOVE BIOGEL PI IND STRL 6.5 (GLOVE) ×2 IMPLANT
GLOVE BIOGEL PI IND STRL 7.0 (GLOVE) ×1 IMPLANT
GLOVE BIOGEL PI IND STRL 7.5 (GLOVE) ×1 IMPLANT
GLOVE BIOGEL PI INDICATOR 6.5 (GLOVE) ×2
GLOVE BIOGEL PI INDICATOR 7.0 (GLOVE) ×1
GLOVE BIOGEL PI INDICATOR 7.5 (GLOVE) ×1
GLOVE SS BIOGEL STRL SZ 6.5 (GLOVE) ×1 IMPLANT
GLOVE SUPERSENSE BIOGEL SZ 6.5 (GLOVE) ×1
GLOVE SURG SS PI 7.0 STRL IVOR (GLOVE) ×4 IMPLANT
GLOVE SURG SS PI 7.5 STRL IVOR (GLOVE) ×2 IMPLANT
GOWN PREVENTION PLUS XXLARGE (GOWN DISPOSABLE) ×2 IMPLANT
GOWN STRL NON-REIN LRG LVL3 (GOWN DISPOSABLE) ×6 IMPLANT
GOWN STRL REIN XL XLG (GOWN DISPOSABLE) ×4 IMPLANT
HEMOSTAT SNOW SURGICEL 2X4 (HEMOSTASIS) ×2 IMPLANT
KIT BASIN OR (CUSTOM PROCEDURE TRAY) ×2 IMPLANT
KIT ROOM TURNOVER OR (KITS) ×2 IMPLANT
NS IRRIG 1000ML POUR BTL (IV SOLUTION) ×4 IMPLANT
PACK PERIPHERAL VASCULAR (CUSTOM PROCEDURE TRAY) ×2 IMPLANT
PAD ARMBOARD 7.5X6 YLW CONV (MISCELLANEOUS) ×4 IMPLANT
PADDING CAST COTTON 6X4 STRL (CAST SUPPLIES) IMPLANT
PATCH VASCULAR VASCU GUARD 1X6 (Vascular Products) ×2 IMPLANT
SET COLLECT BLD 21X3/4 12 (NEEDLE) IMPLANT
SPONGE GAUZE 4X4 12PLY (GAUZE/BANDAGES/DRESSINGS) ×2 IMPLANT
STOPCOCK 4 WAY LG BORE MALE ST (IV SETS) IMPLANT
SUT ETHILON 3 0 PS 1 (SUTURE) IMPLANT
SUT PROLENE 5 0 C 1 24 (SUTURE) IMPLANT
SUT PROLENE 6 0 BV (SUTURE) ×4 IMPLANT
SUT PROLENE 7 0 BV 1 (SUTURE) IMPLANT
SUT SILK 3 0 (SUTURE)
SUT SILK 3-0 18XBRD TIE 12 (SUTURE) IMPLANT
SUT VIC AB 2-0 CT1 27 (SUTURE) ×1
SUT VIC AB 2-0 CT1 TAPERPNT 27 (SUTURE) ×1 IMPLANT
SUT VIC AB 3-0 SH 27 (SUTURE) ×1
SUT VIC AB 3-0 SH 27X BRD (SUTURE) ×1 IMPLANT
SUT VICRYL 4-0 PS2 18IN ABS (SUTURE) ×2 IMPLANT
SYR 3ML LL SCALE MARK (SYRINGE) ×2 IMPLANT
TOWEL OR 17X24 6PK STRL BLUE (TOWEL DISPOSABLE) ×4 IMPLANT
TOWEL OR 17X26 10 PK STRL BLUE (TOWEL DISPOSABLE) ×4 IMPLANT
TRAY FOLEY CATH 16FRSI W/METER (SET/KITS/TRAYS/PACK) IMPLANT
TUBING EXTENTION W/L.L. (IV SETS) IMPLANT
UNDERPAD 30X30 INCONTINENT (UNDERPADS AND DIAPERS) ×2 IMPLANT
WATER STERILE IRR 1000ML POUR (IV SOLUTION) ×2 IMPLANT

## 2012-12-30 NOTE — Transfer of Care (Signed)
Immediate Anesthesia Transfer of Care Note  Patient: [name]  Procedure(s) Performed: Procedure(s): EMBOLECTOMY LEFT LEG, with patch angioplasty (Left)  Patient Location: PACU  Anesthesia Type:General  Level of Consciousness: awake, alert , oriented and patient cooperative  Airway & Oxygen Therapy: Patient Spontanous Breathing and Patient connected to face mask oxygen  Post-op Assessment: Report given to PACU RN, Post -op Vital signs reviewed and stable and Patient moving all extremities  Post vital signs: Reviewed and stable  Complications: No apparent anesthesia complications

## 2012-12-30 NOTE — Anesthesia Preprocedure Evaluation (Addendum)
Anesthesia Evaluation  Patient identified by MRN, date of birth, ID band Patient awake    Reviewed: Allergy & Precautions, H&P , NPO status , Patient's Chart, lab work & pertinent test results  Airway Mallampati: II TM Distance: >3 FB     Dental   Pulmonary          Cardiovascular hypertension, Rhythm:Irregular Rate:Tachycardia     Neuro/Psych    GI/Hepatic   Endo/Other    Renal/GU      Musculoskeletal   Abdominal   Peds  Hematology   Anesthesia Other Findings   Reproductive/Obstetrics                          Anesthesia Physical Anesthesia Plan  ASA: III  Anesthesia Plan: General   Post-op Pain Management:    Induction: Intravenous  Airway Management Planned: Oral ETT  Additional Equipment:   Intra-op Plan:   Post-operative Plan:   Informed Consent: I have reviewed the patients History and Physical, chart, labs and discussed the procedure including the risks, benefits and alternatives for the proposed anesthesia with the patient or authorized representative who has indicated his/her understanding and acceptance.     Plan Discussed with: CRNA and Anesthesiologist  Anesthesia Plan Comments:        Anesthesia Quick Evaluation

## 2012-12-30 NOTE — Op Note (Signed)
Patient name: [name] MRN: [id number] DOB: February 28, 1921 Sex: female  12/29/2012 - 12/30/2012 Pre-operative Diagnosis: Left lower extremity ischemia Post-operative diagnosis:  Same Surgeon:  Jorge Ny Assistants:  Della Goo Procedure:   Left superficial femoral, popliteal, anterior tibial, peroneal artery and embolectomy   #2: Left below knee popliteal artery patch angioplasty Anesthesia:  Gen. Blood Loss:  See anesthesia record Specimens:  Thrombus  Findings:  Subacute thrombus within the distal superficial femoral, popliteal artery which extended into the anterior tibial and peroneal artery.  I had to open the popliteal artery longitudinally in order to get the Fogarty catheter to go into the anterior tibial artery origin.  This necessitated bovine pericardial patch and plasty  Indications:  The patient reports approximately one week of abdominal pain.  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  She is on anticoagulation for atrial fibrillation.  When she was evaluated by myself this morning, she reported approximately a three-week history of cramping in her left leg as well as having trouble walking.  On examination she had a palpable dorsalis pedis pulse in the right and nonpalpable on the left.  I ordered a Doppler ultrasound which showed occlusion of the distal superficial femoral artery with reconstitution of her tibial vessels.  I discussed with the family that given her presentation with thrombus in her celiac artery and a infarcted left kidney, the most likely explanation for her leg symptoms is a thromboembolus.  I propose taking her to the operating room to see if this could be thrombectomized.  I felt this was the most appropriate course of action.  I elected not to get additional imaging as she has had a slight increase in her creatinine.  I discussed the possibility of doing fasciotomies as well as the  possibility of not being able to reestablish blood flow.  I told the family that I would not pursue aggressive measures for revascularization which include bypass.  Procedure:  The patient was identified in the holding area and taken to [facility] OR ROOM 12  The patient was then placed supine on the table. general anesthesia was administered.  The patient was prepped and draped in the usual sterile fashion.  A time out was called and antibiotics were administered.  A medial below knee incision was made.  Cautery was used to divide the subcutaneous tissue down to the fascia which was also opened with cautery.  The popliteal space was entered.  I divided the soleus muscle off of the posterior edge of the tibia.  Next, popliteal artery and vein were dissected free.  I dissected out the popliteal artery down to the takeoff of the anterior tibial artery.  The anterior tibial and tibioperoneal trunk were each individually isolated.  I divided the anterior tibial vein.  At this point, the patient was heparinized.  A transverse arteriotomy was made just above the origin of the anterior tibial artery.  I passed a #4 Fogarty catheter proximally and evacuated subacute thrombus which was congealed and well formed.  Multiple passes of the Fogarty were performed until I finally got good inflow.  Once this was est. the artery was reoccluded.  I then passed a #3 Fogarty catheter down the anterior tibial and peroneal artery.  In order to facilitate getting the #3 Fogarty into the anterior tibial artery, had to open the arteriotomy longitudinally.  I was able to get good backbleeding from the  anterior tibial and peroneal artery.  I could not get the Fogarty to go down the posterior tibial artery.  I evacuated subacute thrombus from the anterior tibial and peroneal artery and est. good backbleeding.  A bovine pericardial patch was then selected.  Patch angioplasty was performed using a running 6-0 Prolene.  Prior to completion the  appropriate flushing maneuvers were performed, and the anastomosis was completed.  There was excellent Doppler signal distal to the patch angioplasty in the anterior tibial and tibioperoneal trunk.  The patient had a palpable dorsalis pedis pulse.  I did not reverse the heparin.  Once hemostasis was satisfactory I closed the subcutaneous tissue with 3-0 Vicryl, and the skin with 4-0 Vicryl.  I did not close the fascia from the medial incision.  I elected not to do fasciotomies at this time.  There were no immediate complications.   Disposition:  To PACU in stable condition.   Juleen China, M.D. [facility] [place] Office: [phone] Pager:  [phone]

## 2012-12-30 NOTE — Progress Notes (Signed)
VASCULAR LAB PRELIMINARY  ARTERIAL  ABI completed:    RIGHT    LEFT    PRESSURE WAVEFORM  PRESSURE WAVEFORM  BRACHIAL 163 Triphasic BRACHIAL 172 Triphasic  AT 210 Triphasic AT 73 Dampened Monophasic  PT 217 Triphasic PT 93 Dampened Monophasic    RIGHT LEFT  ABI 1.26 0.54   ABIs indicate normal arterial flow with normal Doppler waveforms on the right. Left ABI indicates a moderate to severe reduction in arterial flow with abnormal Doppler waveforms. Cannot rule out collateral flow versus native vessel.  Sumner Boesch, RVS 12/30/2012, 10:34 AM

## 2012-12-30 NOTE — Progress Notes (Signed)
PULMONARY  / CRITICAL CARE MEDICINE  Name: [name] MRN: [id number] DOB: 03-13-1921    ADMISSION DATE:  12/29/2012 CONSULTATION DATE:  12/29/2012  REFERRING MD :  [facility] ED PRIMARY SERVICE:  PCCM  CHIEF COMPLAINT:  Abdominal pain  BRIEF PATIENT DESCRIPTION: 79 yo with past medical history of HTN brought to [facility] ED with LLQ abdominal pain. In ED CT reveled renal / celiac artery thrombi and infarcted left kidney.  Vascular surgery was consulted and she was transferred to [facility] for further management.  SIGNIFICANT EVENTS / STUDIES:  11/20  Presented to [facility] ED with LLQ abdominal pain 11/20  CT abdomen >>> Infarcted L kidney, thrombus in left renal / celiac artery, biliary dilation likely due to prior cholecystectomy 11/20  Transferred to [facility]  LINES / TUBES: 11/20 foley>>  CULTURES:   ANTIBIOTICS:  ROS - per H&P  SUBJECTIVE: Feels okay, asks when she can go home, states left leg hurting x 1 week.  VITAL SIGNS: Temp:  [98.2 F (36.8 C)] 98.2 F (36.8 C) (11/20 2137) Pulse Rate:  [58-79] 58 (11/21 0600) Resp:  [17-24] 23 (11/21 0600) BP: (137-159)/(49-68) 152/57 mmHg (11/21 0600) SpO2:  [93 %-99 %] 93 % (11/21 0600) Weight:  [135 lb 2.3 oz (61.3 kg)-137 lb 2 oz (62.2 kg)] 137 lb 2 oz (62.2 kg) (11/21 0350) HEMODYNAMICS:   VENTILATOR SETTINGS:   INTAKE / OUTPUT: Intake/Output     11/20 0701 - 11/21 0700 11/21 0701 - 11/22 0700   I.V. (mL/kg) 635.6 (10.2)    Total Intake(mL/kg) 635.6 (10.2)    Urine (mL/kg/hr) 710    Total Output 710     Net -74.4           PHYSICAL EXAMINATION:  General:  No acute distress, pleasant Neuro:  Awake, alert, cooperative HEENT: AT/[place] Cardiovascular:  Paced rhythm, no murmurs Lungs:  Normal effort, bilateral air entry Abdomen:  Soft, some tenderness LLQ / left flank Musculoskeletal:  Moves all extremities, no edema; left back tenderness Skin:  Intact, small amount bruising GU: Foley in place  LABS:   Recent Labs Lab  12/29/12 2200  HGB 13.4  HCT 38.7  WBC 13.5*  PLT 165    Recent Labs Lab 12/29/12 2200  INR 1.38     Recent Labs Lab 12/29/12 2200  NA 138  K 3.6  CL 99  CO2 27  GLUCOSE 241*  BUN 17  CREATININE 1.06  CALCIUM 10.6*  MG 1.9  PHOS 3.1   Electrolytes  Recent Labs Lab 12/29/12 2200  CALCIUM 10.6*  MG 1.9  PHOS 3.1   Sepsis Markers  Recent Labs Lab 12/29/12 2200 12/30/12 0332  LATICACIDVEN 3.0* 3.0*   ABG No results found for this basename: PHART, PCO2ART, PO2ART,  in the last 168 hours Liver Enzymes  Recent Labs Lab 12/29/12 2200  AST 196*  ALT 73*  ALKPHOS 83  BILITOT 0.8  ALBUMIN 3.2*   Cardiac Enzymes No results found for this basename: TROPONINI, PROBNP,  in the last 168 hours Glucose  Recent Labs Lab 12/29/12 2137 12/30/12 0035 12/30/12 0335  GLUCAP 238* 206* 128*    CXR:  11/20 >>> Dual lead pacer, no overt airspace disease  ASSESSMENT / PLAN:  PULMONARY A:  No active issues. P:   No intervention required  CARDIOVASCULAR A: Renal / celiac artery thrombosis.  L popliteal thrombosis  AF, rate-controlled Sick sinus syndrome, s/p dual chamber pacemaker.   Chronic diastolic heart failure without exacerbation. P:  Vascular SGY  following, appreciate input. Planning for LE enarterectomy Anticoagulation with heparin Trend lactate Would start cozaar. Could also restart diltiazem.  RENAL A:  Left kidney infarct. Urinary retention. P:   Trend BMP NS@75  Foley cath Hold Lasix  GASTROINTESTINAL A:  Possible bowel ischemia, at risk given celiac thrombosis Elevated transaminases, etiology?  Biliary dilation on CT at outside ED P:   NPO for vascular eval Protonix as on PPI preadmission Trend LFT Trend lactate   HEMATOLOGIC A:  On Pradaxa prior to admission for AF. P:  Trend CBC / APTT Heparin gtt per pharmacy ?compliance for pradaxa though pt states compliant  INFECTIOUS A:  No active issues.  P:   No intervention  required  ENDOCRINE  A:  Hypothyroidism.  Hyperglycemia. P:   Restart synthroid SSI  NEUROLOGIC A:  H/o anxiety. P:   Xanax PRN when able  [name], MD Family Practice PGY-2  12/30/2012, 7:09 AM   [name], MD, PhD 12/30/2012, 11:04 AM [facility] [phone] or if no answer [phone]

## 2012-12-30 NOTE — Anesthesia Postprocedure Evaluation (Signed)
Anesthesia Post Note  Patient: [name]  Procedure(s) Performed: Procedure(s) (LRB): EMBOLECTOMY LEFT LEG, with patch angioplasty (Left)  Anesthesia type: General  Patient location: PACU  Post pain: Pain level controlled and Adequate analgesia  Post assessment: Post-op Vital signs reviewed, Patient's Cardiovascular Status Stable, Respiratory Function Stable, Patent Airway and Pain level controlled  Last Vitals:  Filed Vitals:   12/30/12 1815  BP:   Pulse:   Temp: 37 C  Resp:     Post vital signs: Reviewed and stable  Level of consciousness: awake, alert  and oriented  Complications: No apparent anesthesia complications

## 2012-12-30 NOTE — Progress Notes (Signed)
ANTICOAGULATION CONSULT NOTE - Initial Consult  Pharmacy Consult for heparin  Indication: renal / celiac artery thrombosis; Afib   No Known Allergies  Patient Measurements: Height: 5\' 4"  (162.6 cm) Weight: 137 lb 2 oz (62.2 kg) IBW/kg (Calculated) : 54.7 Heparin Dosing Weight: n/a  Vital Signs: Temp: 98.6 F (37 C) (11/21 0727) Temp src: Oral (11/21 0727) BP: 157/59 mmHg (11/21 0900) Pulse Rate: 60 (11/21 0900)  Labs:  Recent Labs  12/29/12 2200 12/30/12 0843  HGB 13.4 13.1  HCT 38.7 37.6  PLT 165 161  APTT 116* 129*  LABPROT 16.6*  --   INR 1.38  --   HEPARINUNFRC  --  1.01*  CREATININE 1.06 1.02    Estimated Creatinine Clearance: 32.3 ml/min (by C-G formula based on Cr of 1.02).   Medical History: Past Medical History  Diagnosis Date  . Sick sinus syndrome     s/p PPM by Dr Juanda Chance (MDT)  . Hypertension   . FHx: mastectomy     for breast cancer  . Persistent atrial fibrillation     on pradaxa  . Gastro - esophageal reflux disease   . Allergic rhinitis due to pollen   . Other specified acquired hypothyroidism   . Body mass index 28.0-28.9, adult   . Other and unspecified hyperlipidemia   . Encounter for long-term (current) use of other medications   . Other B-complex deficiencies   . Memory loss   . Pain in limb   . Dizziness and giddiness   . Diastolic dysfunction     Medications:  Prescriptions prior to admission  Medication Sig Dispense Refill  . calcium carbonate (OS-CAL - DOSED IN MG OF ELEMENTAL CALCIUM) 1250 MG tablet Take 1 tablet by mouth daily with breakfast.      . dabigatran (PRADAXA) 150 MG CAPS Take 1 capsule (150 mg total) by mouth 2 (two) times daily.  60 capsule  12  . diltiazem (DILACOR XR) 180 MG 24 hr capsule Take 180 mg by mouth daily.      Marland Kitchen escitalopram (LEXAPRO) 10 MG tablet Take 10 mg by mouth daily.      . furosemide (LASIX) 40 MG tablet Take 40 mg by mouth 2 (two) times daily.      Marland Kitchen levothyroxine (SYNTHROID, LEVOTHROID)  100 MCG tablet Take 100 mcg by mouth daily before breakfast.      . loratadine (CLARITIN) 10 MG tablet Take 10 mg by mouth daily.      Marland Kitchen omeprazole (PRILOSEC) 20 MG capsule Take 20 mg by mouth daily.      . potassium chloride (K-DUR) 10 MEQ tablet Take 30 mEq by mouth daily.      . vitamin B-12 (CYANOCOBALAMIN) 1000 MCG tablet Take 1,000 mcg by mouth daily.        . Vitamin D, Ergocalciferol, (DRISDOL) 50000 UNITS CAPS capsule Take 50,000 Units by mouth every 7 (seven) days. Sunday        Assessment: 79 yo F brought to [facility] ED with LLQ abdominal pain. 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 She was then transferred to [facility] for further management. Patient was on pradaxa 150 mg twice daily PTA for Afib. Per patient's relative, last dose of pradaxa was taken around 1900 on 11/19. The follow up heparin level has returned supratherapeutic at 1.01. CBC and kidney function remains stable.  No signs of bleeding noted.  Goal of Therapy:  Heparin level 0.3-0.7 units/ml Monitor platelets by anticoagulation protocol: Yes  Plan:  - hold heparin gtt for 1 hour until 1100, informed nurse - decrease heparin gtt to 750 units/hr upon restart  - redraw hep level in 8 hours  - daily CBC and hep lvl - monitor for signs of bleeding.   Shelba Flake Achilles Dunk, PharmD Clinical Pharmacist - Resident Pager: [phone] Pharmacy: [phone] 12/30/2012 9:59 AM

## 2012-12-30 NOTE — Progress Notes (Signed)
eLink Physician-Brief Progress Note Patient Name: [name] DOB: [birth year] MRN: [id number]  Date of Service  12/30/2012   HPI/Events of Note  Hyperglycemia   eICU Interventions  Placed on q4 hours moderate SSI    Intervention Category Intermediate Interventions: Hyperglycemia - evaluation and treatment  Coco Sharpnack 12/30/2012, 12:53 AM

## 2012-12-30 NOTE — Progress Notes (Signed)
eLink Physician-Brief Progress Note Patient Name: [name] DOB: August 11, 1921 MRN: [id number]  Date of Service  12/30/2012   HPI/Events of Note  Patient had bladder scan on admission and was found to have 700 cc residual urine. I/O cath performed.  No UOP since and found to another 300 cc urine in bladder on scan.q   eICU Interventions  Plan: Insert foley to straight drain   Intervention Category Intermediate Interventions: Oliguria - evaluation and management  DETERDING,ELIZABETH 12/30/2012, 5:59 AM

## 2012-12-30 NOTE — Progress Notes (Signed)
Bladder scan was performed when patient arrived.  414 cc's of urine noted and straight catheterization removed 350 cc's.   Monitored patient over the next 6 hours.  No urine output.  Bladder scanned a second time.  324 cc's of urine noted.  Called and spoke with Dr. Darrick Penna who advised to place an indwelling urinary catheter.

## 2012-12-30 NOTE — Consult Note (Signed)
Consult Note  Patient name: [name] MRN: [id number] DOB: 01-Jan-1922 Sex: female  Consulting Physician: ICU  Reason for Consult: No chief complaint on file.   HISTORY OF PRESENT ILLNESS: This is a very pleasant 79 year old female who was transferred from [facility] emergency department with a one-week history of abdominal pain.  She had a CT scan which showed a infarcted left kidney and thrombus within her celiac artery.  The superior mesenteric artery was widely patent as was the infrarenal abdominal aorta.  She was transferred for further management.  The patient is on anticoagulation for atrial fibrillation..  She has a pacemaker in place She is medically for hypertension.  She has had mild elevation in her creatinine.  She has not eaten for greater than one week although she does state she is taking her medicines.  She states that her left leg has been bothering her for approximately 3 weeks.  She describes cramping in the calf as well as difficulty putting weight on it.  Past Medical History  Diagnosis Date  . Sick sinus syndrome     s/p PPM by Dr Juanda Chance (MDT)  . Hypertension   . FHx: mastectomy     for breast cancer  . Persistent atrial fibrillation     on pradaxa  . Gastro - esophageal reflux disease   . Allergic rhinitis due to pollen   . Other specified acquired hypothyroidism   . Body mass index 28.0-28.9, adult   . Other and unspecified hyperlipidemia   . Encounter for long-term (current) use of other medications   . Other B-complex deficiencies   . Memory loss   . Pain in limb   . Dizziness and giddiness   . Diastolic dysfunction     Past Surgical History  Procedure Laterality Date  . Pacemaker insertion      by Dr Juanda Chance for tachy/brady syndrome (MDT)  . Mastectomy    . Tonsillectomy    . Appendectomy      History   Social History  . Marital Status: Widowed    Spouse Name: N/A    Number of Children: 2  . Years of Education: N/A    Occupational History  . Retired    Social History Main Topics  . Smoking status: Never Smoker   . Smokeless tobacco: Not on file  . Alcohol Use: No  . Drug Use: No  . Sexual Activity: Not on file   Other Topics Concern  . Not on file   Social History Narrative   Participates in a senior games.      Family History  Problem Relation Age of Onset  . Heart disease Maternal Grandfather   . Diabetes Sister   . Tuberculosis Mother     father  . Diabetes Brother   . Stroke Brother     Allergies as of 12/29/2012  . (No Known Allergies)    No current facility-administered medications on file prior to encounter.   Current Outpatient Prescriptions on File Prior to Encounter  Medication Sig Dispense Refill  . dabigatran (PRADAXA) 150 MG CAPS Take 1 capsule (150 mg total) by mouth 2 (two) times daily.  60 capsule  12  . vitamin B-12 (CYANOCOBALAMIN) 1000 MCG tablet Take 1,000 mcg by mouth daily.           REVIEW OF SYSTEMS: Please see history of present illness, otherwise all systems negative   PHYSICAL EXAMINATION: General: The patient appears their stated age.  Vital signs are BP 152/57  Pulse 58  Temp(Src) 98.6 F (37 C) (Oral)  Resp 23  Ht 5\' 4"  (1.626 m)  Wt 137 lb 2 oz (62.2 kg)  BMI 23.53 kg/m2  SpO2 93% Pulmonary: Respirations are non-labored HEENT:  No gross abnormalities Abdomen: Soft and non-tender  Musculoskeletal: There are no major deformities.   Neurologic: No focal weakness or paresthesias are detected, Skin: There are no ulcer or rashes noted. Psychiatric: The patient has normal affect. Cardiovascular: There is a regular rate and rhythm without significant murmur appreciated.  Palpable right dorsalis pedis pulse, nonpalpable left.  Palpable femoral pulses bilaterally  Diagnostic Studies: I have reviewed her CT antrum which is nonocclusive thrombus within the celiac artery.  The vasculature to the liver spleen and stomach appeared opacified.   Superior mesenteric artery is widely patent.  The infrarenal abdominal aorta and iliac system bilaterally are widely patent.  Complete infarction of the left kidney is identified   Assessment:  Thromboembolism to the celiac artery, left kidney and presumed left leg Plan: The patient does not show signs of mesenteric ischemia, therefore would not recommend any intervention of the celiac artery thrombus.  The left kidney has infarcted for greater than one week.  No intervention for this other than pain control was required.  I am concerned about the patient's symptoms in her left leg.  It sounds like she most likely through a embolus to the left leg.  She does have palpable femoral pulses but not pedal pulses on the left I am getting a duplex ultrasound.  Depending on these findings she may require surgical intervention today.  She'll be kept n.p.o.     Jorge Ny, M.D. [facility] [place] Office: [phone] Pager:  [phone]

## 2012-12-30 NOTE — Progress Notes (Signed)
Patient's sons brought in a full DNR form in the prior shift.  This conflicts with the wishes that were conveyed the previous night when patient and sons expressed that patient wants to be a DNI only, with all other resuscitative efforts to be used if necessary.  Spoke with Zenia Resides (son) and tried to clarify the patient's wishes.  Explained that the form they provided will not allow for CPR or defibrillation.  Sons and patient said that they wanted CPR and defibrillation, if necessary.  However, they do not want patient to be intubated to save her life.

## 2012-12-31 LAB — COMPREHENSIVE METABOLIC PANEL
ALT: 50 U/L — ABNORMAL HIGH (ref 0–35)
AST: 74 U/L — ABNORMAL HIGH (ref 0–37)
Albumin: 2.2 g/dL — ABNORMAL LOW (ref 3.5–5.2)
Alkaline Phosphatase: 80 U/L (ref 39–117)
CO2: 24 mEq/L (ref 19–32)
Calcium: 9.9 mg/dL (ref 8.4–10.5)
Chloride: 107 mEq/L (ref 96–112)
GFR calc Af Amer: 55 mL/min — ABNORMAL LOW (ref >90)
GFR calc non Af Amer: 48 mL/min — ABNORMAL LOW (ref >90)
Glucose, Bld: 117 mg/dL — ABNORMAL HIGH (ref 70–99)
Sodium: 141 mEq/L (ref 135–145)
Total Bilirubin: 0.6 mg/dL (ref 0.3–1.2)

## 2012-12-31 LAB — BASIC METABOLIC PANEL
BUN: 16 mg/dL (ref 6–23)
BUN: 20 mg/dL (ref 6–23)
CO2: 19 mEq/L (ref 19–32)
Calcium: 10.3 mg/dL (ref 8.4–10.5)
Chloride: 102 mEq/L (ref 96–112)
Creatinine, Ser: 1.03 mg/dL (ref 0.50–1.10)
GFR calc Af Amer: 53 mL/min — ABNORMAL LOW (ref >90)
GFR calc non Af Amer: 46 mL/min — ABNORMAL LOW (ref >90)
Glucose, Bld: 133 mg/dL — ABNORMAL HIGH (ref 70–99)
Glucose, Bld: 220 mg/dL — ABNORMAL HIGH (ref 70–99)
Potassium: 3.7 mEq/L (ref 3.5–5.1)
Sodium: 141 mEq/L (ref 135–145)

## 2012-12-31 LAB — GLUCOSE, CAPILLARY
Glucose-Capillary: 108 mg/dL — ABNORMAL HIGH (ref 70–99)
Glucose-Capillary: 152 mg/dL — ABNORMAL HIGH (ref 70–99)
Glucose-Capillary: 194 mg/dL — ABNORMAL HIGH (ref 70–99)

## 2012-12-31 LAB — CBC
HCT: 32.3 % — ABNORMAL LOW (ref 36.0–46.0)
MCH: 31.7 pg (ref 26.0–34.0)
MCHC: 34.4 g/dL (ref 30.0–36.0)
MCHC: 33.9 g/dL (ref 30.0–36.0)
MCV: 93.5 fL (ref 78.0–100.0)
Platelets: 132 10*3/uL — ABNORMAL LOW (ref 150–400)
Platelets: 134 10*3/uL — ABNORMAL LOW (ref 150–400)
RBC: 3.53 MIL/uL — ABNORMAL LOW (ref 3.87–5.11)
RDW: 17.3 % — ABNORMAL HIGH (ref 11.5–15.5)
RDW: 17.5 % — ABNORMAL HIGH (ref 11.5–15.5)
WBC: 22.2 10*3/uL — ABNORMAL HIGH (ref 4.0–10.5)

## 2012-12-31 MED ORDER — HEPARIN (PORCINE) IN NACL 100-0.45 UNIT/ML-% IJ SOLN
1200.0000 [IU]/h | INTRAMUSCULAR | Status: DC
  Administered 2012-12-31: 850 [IU]/h via INTRAVENOUS
  Administered 2013-01-01: 11:00:00 1000 [IU]/h via INTRAVENOUS
  Administered 2013-01-02: 14:00:00 1200 [IU]/h via INTRAVENOUS
  Filled 2012-12-31 (×7): qty 250

## 2012-12-31 MED ORDER — PROPOFOL 10 MG/ML IV BOLUS
INTRAVENOUS | Status: AC
  Filled 2012-12-31: qty 20

## 2012-12-31 MED ORDER — POTASSIUM CHLORIDE CRYS ER 20 MEQ PO TBCR
40.0000 meq | EXTENDED_RELEASE_TABLET | Freq: Once | ORAL | Status: AC
  Administered 2012-12-31: 40 meq via ORAL
  Filled 2012-12-31: qty 2

## 2012-12-31 MED ORDER — FENTANYL CITRATE 0.05 MG/ML IJ SOLN
INTRAMUSCULAR | Status: AC
  Filled 2012-12-31: qty 4

## 2012-12-31 MED ORDER — PANTOPRAZOLE SODIUM 40 MG PO TBEC
40.0000 mg | DELAYED_RELEASE_TABLET | Freq: Every day | ORAL | Status: DC
  Administered 2012-12-31 – 2013-01-04 (×5): 40 mg via ORAL
  Filled 2012-12-31 (×5): qty 1

## 2012-12-31 MED ORDER — HEPARIN (PORCINE) IN NACL 100-0.45 UNIT/ML-% IJ SOLN: 850.0000 [IU]/h | INTRAMUSCULAR | Status: DC

## 2012-12-31 NOTE — Progress Notes (Signed)
PULMONARY  / CRITICAL CARE MEDICINE  Name: [name] MRN: [id number] DOB: 05/10/1921    ADMISSION DATE:  12/29/2012 CONSULTATION DATE:  12/29/2012  REFERRING MD :  [facility] ED PRIMARY SERVICE:  PCCM  CHIEF COMPLAINT:  Abdominal pain  BRIEF PATIENT DESCRIPTION: 79 yo with past medical history of HTN brought to [facility] ED with LLQ abdominal pain. In ED CT reveled renal / celiac artery thrombi and infarcted left kidney.  Vascular surgery was consulted and she was transferred to [facility] for further management.  SIGNIFICANT EVENTS / STUDIES:  11/20  Presented to [facility] ED with LLQ abdominal pain 11/20  CT abdomen >>> Infarcted L kidney, thrombus in left renal / celiac artery, biliary dilation likely due to prior cholecystectomy 11/20  Transferred to [facility] 11/21 Left superficial femoral, popliteal, anterior tibial, peroneal artery and embolectomy, Left below knee popliteal artery patch angioplasty by vascular surgery  LINES / TUBES: 11/20 foley>>  CULTURES:   ANTIBIOTICS:  ROS - per H&P  SUBJECTIVE: Pt feels well today, wants something to drink.  VITAL SIGNS: Temp:  [97.6 F (36.4 C)-98.6 F (37 C)] 98.1 F (36.7 C) (11/22 0433) Pulse Rate:  [31-163] 58 (11/22 0500) Resp:  [13-28] 13 (11/22 0500) BP: (127-180)/(35-106) 134/61 mmHg (11/22 0500) SpO2:  [93 %-100 %] 94 % (11/22 0500) Weight:  [140 lb 14 oz (63.9 kg)] 140 lb 14 oz (63.9 kg) (11/22 0440) HEMODYNAMICS:   VENTILATOR SETTINGS:   INTAKE / OUTPUT: Intake/Output     11/21 0701 - 11/22 0700   I.V. (mL/kg) 2021 (31.6)   IV Piggyback 50   Total Intake(mL/kg) 2071 (32.4)   Urine (mL/kg/hr) 880 (0.6)   Blood 200 (0.1)   Total Output 1080   Net +991        PHYSICAL EXAMINATION:  General:  No acute distress, pleasant Neuro:  Awake, alert, cooperative HEENT: AT/[place], O/p dry Cardiovascular:  Paced rhythm, no murmurs, 1+ bilateral DP pulse present Lungs:  Normal effort, bilateral air entry though  diminished Abdomen:  Soft, some tenderness LLQ / left flank on palpation Musculoskeletal:  Moves all extremities, no edema Skin:  Intact, small amount bruising GU: Foley in place  LABS:   Recent Labs Lab 12/30/12 0843 12/31/12 0115 12/31/12 0418  HGB 13.1 11.1* 11.2*  HCT 37.6 32.3* 33.0*  WBC 22.0* 22.2* 20.9*  PLT 161 132* 134*    Recent Labs Lab 12/29/12 2200  INR 1.38     Recent Labs Lab 12/29/12 2200 12/30/12 0843 12/31/12 0115 12/31/12 0418  NA 138 140 141 141  K 3.6 3.5 3.7 3.3*  CL 99 104 106 107  CO2 27 22 27 24   GLUCOSE 241* 148* 133* 117*  BUN 17 15 16 17   CREATININE 1.06 1.02 1.05 1.01  CALCIUM 10.6* 10.7* 10.1 9.9  MG 1.9  --   --   --   PHOS 3.1  --   --   --    Electrolytes  Recent Labs Lab 12/29/12 2200 12/30/12 0843 12/31/12 0115 12/31/12 0418  CALCIUM 10.6* 10.7* 10.1 9.9  MG 1.9  --   --   --   PHOS 3.1  --   --   --    Sepsis Markers  Recent Labs Lab 12/30/12 0955 12/30/12 1300 12/30/12 2000  LATICACIDVEN 1.8 1.9 3.2*   ABG No results found for this basename: PHART, PCO2ART, PO2ART,  in the last 168 hours Liver Enzymes  Recent Labs Lab 12/29/12 2200 12/30/12 0843 12/31/12 0418  AST  196* 155* 74*  ALT 73* 70* 50*  ALKPHOS 83 80 80  BILITOT 0.8 1.0 0.6  ALBUMIN 3.2* 3.0* 2.2*   Cardiac Enzymes No results found for this basename: TROPONINI, PROBNP,  in the last 168 hours Glucose  Recent Labs Lab 12/30/12 0035 12/30/12 0335 12/30/12 0726 12/30/12 1136 12/30/12 2344 12/31/12 0407  GLUCAP 206* 128* 131* 145* 152* 108*    CXR: No new imaging  ASSESSMENT / PLAN:  PULMONARY A:  No active issues. P:   No intervention required  CARDIOVASCULAR A: Renal / celiac artery thrombosis.  L popliteal /superficial femoral thrombosis > s/p sgy  AF, rate-controlled Sick sinus syndrome, s/p dual chamber pacemaker.   Chronic diastolic heart failure without exacerbation. Lactate stable P:  Vascular SGY performed  LE endarterectomy 11/21 Anticoagulation with heparin, duration per VVS Trend lactate Started cozaar and restarted diltiazem 11/21 Morphine prn pain  RENAL A:  Left kidney infarct. Urinary retention. Oliguria > with foley, only 70cc out since 9pm 11/21; etiology unclear Mild hypokalemia P:   Repleted K overnight Trend BMP 11/22 pm and am NS@75 , increase with decreased urine production Foley cath > check bladder scan  Holding Lasix  GASTROINTESTINAL A:  Possible transient bowel ischemia, at risk given celiac thrombosis Elevated transaminases improving, likely due to celiac thrombosis Biliary dilation on CT at outside ED P:   NPO for now, advance as tolerated, check swallow eval per SLP given reported dysphagia Defer further eval biliary tree unless she clinically worsens Protonix as on PPI preadmission Continue to trend lactate   HEMATOLOGIC A:  On Pradaxa prior to admission for AF. P:  Trend CBC / APTT Heparin gtt per pharmacy  INFECTIOUS A:  Leukocytosis, likely due to renal injury P:   No intervention at this time  ENDOCRINE  A:  Hypothyroidism.  Hyperglycemia. P:   Restarted synthroid SSI  NEUROLOGIC A:  H/o anxiety. P:   Restarted lexapro  GLOBAL:  Will transfer to telemetry and to [facility] or vascular as of 11/23  [name], MD Family Practice PGY-2 12/31/2012, 5:55 AM  [name], MD, PhD 12/31/2012, 8:22 AM [facility] [phone] or if no answer [phone]

## 2012-12-31 NOTE — Progress Notes (Signed)
Patient has an indwelling urinary catheter on had 70 cc's urine output during the shift.  Called Resident and notified her of this finding.  She informed that she would come by to see patient.

## 2012-12-31 NOTE — Progress Notes (Signed)
ANTICOAGULATION CONSULT NOTE - Initial Consult  Pharmacy Consult for heparin  Indication: renal / celiac artery thrombosis; Afib   No Known Allergies  Patient Measurements: Height: 5\' 4"  (162.6 cm) Weight: 140 lb 14 oz (63.9 kg) IBW/kg (Calculated) : 54.7 Heparin Dosing Weight: 60kg  Vital Signs: Temp: 98.3 F (36.8 C) (11/22 0749) Temp src: Oral (11/22 0749) BP: 129/78 mmHg (11/22 0700) Pulse Rate: 125 (11/22 0600)  Labs:  Recent Labs  12/29/12 2200 12/30/12 0843 12/31/12 0115 12/31/12 0418 12/31/12 0920  HGB 13.4 13.1 11.1* 11.2*  --   HCT 38.7 37.6 32.3* 33.0*  --   PLT 165 161 132* 134*  --   APTT 116* 129*  --  68*  --   LABPROT 16.6*  --   --   --   --   INR 1.38  --   --   --   --   HEPARINUNFRC  --  1.01*  --   --  0.29*  CREATININE 1.06 1.02 1.05 1.01  --     Estimated Creatinine Clearance: 32.6 ml/min (by C-G formula based on Cr of 1.01).   Medical History: Past Medical History  Diagnosis Date  . Sick sinus syndrome     s/p PPM by Dr Juanda Chance (MDT)  . Hypertension   . FHx: mastectomy     for breast cancer  . Persistent atrial fibrillation     on pradaxa  . Gastro - esophageal reflux disease   . Allergic rhinitis due to pollen   . Other specified acquired hypothyroidism   . Body mass index 28.0-28.9, adult   . Other and unspecified hyperlipidemia   . Encounter for long-term (current) use of other medications   . Other B-complex deficiencies   . Memory loss   . Pain in limb   . Dizziness and giddiness   . Diastolic dysfunction     Assessment: 79 yo F brought to [facility] ED with LLQ abdominal pain. 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 She was then transferred to [facility] for further management.   Patient was on pradaxa 150 mg twice daily PTA for Afib. Per patient's relative, last dose of pradaxa was taken around 1900 on 11/19. This was placed on hold for planned procedures and patient was then started on  heparin drip. Heparin restarted after embolectomy yesterday. Heparin level this morning was just below goal at 0.29. No significant bleeding issues noted. CBC stable.   Goal of Therapy:  Heparin level 0.3-0.7 units/ml Monitor platelets by anticoagulation protocol: Yes   Plan:  - Increase heparin gtt to 850 units/hr - redraw hep level in 8 hours  - daily CBC and hep lvl - monitor for signs of bleeding.   Sheppard Coil PharmD., BCPS Clinical Pharmacist Pager [phone] 12/31/2012 10:39 AM

## 2012-12-31 NOTE — Progress Notes (Signed)
eLink Physician-Brief Progress Note Patient Name: CHARMAIN DIOSDADO DOB: 11-04-1921 MRN: [id number]  Date of Service  12/31/2012   HPI/Events of Note  Hypokalemia   eICU Interventions  Potassium replaced   Intervention Category Minor Interventions: Electrolytes abnormality - evaluation and management  Tamsin Nader 12/31/2012, 6:25 AM

## 2012-12-31 NOTE — Progress Notes (Addendum)
[facility]  Progress Note Bypass Surgery  Date of Surgery: 12/29/2012 - 12/30/2012  Procedure(s): EMBOLECTOMY LEFT LEG, with patch angioplasty Surgeon: Surgeon(s): Nada Libman, MD  1 Day Post-Op  History of Present Illness  [name] is a 79 y.o. female who is  1 Day Post-Op. The patient's pre-op symptoms of pain in the left leg are Improved . Patients pain is well controlled.    VASC. LAB Studies:        ABI: pend  Significant Diagnostic Studies: CBC Lab Results  Component Value Date   WBC 20.9* 12/31/2012   HGB 11.2* 12/31/2012   HCT 33.0* 12/31/2012   MCV 93.5 12/31/2012   PLT 134* 12/31/2012   BMET    Component Value Date/Time   NA 141 12/31/2012 0418   K 3.3* 12/31/2012 0418   CL 107 12/31/2012 0418   CO2 24 12/31/2012 0418   GLUCOSE 117* 12/31/2012 0418   BUN 17 12/31/2012 0418   CREATININE 1.01 12/31/2012 0418   CALCIUM 9.9 12/31/2012 0418   GFRNONAA 48* 12/31/2012 0418   GFRAA 55* 12/31/2012 0418   COAG Lab Results  Component Value Date   INR 1.38 12/29/2012   INR 2.1 03/11/2009   INR 1.06 12/28/2008   Physical Examination  BP Readings from Last 3 Encounters:  12/31/12 129/78  12/31/12 129/78  01/14/12 152/70   Temp Readings from Last 3 Encounters:  12/31/12 98.3 F (36.8 C) Oral  12/31/12 98.3 F (36.8 C) Oral   SpO2 Readings from Last 3 Encounters:  12/31/12 100%  12/31/12 100%   Pulse Readings from Last 3 Encounters:  12/31/12 125  12/31/12 125  01/14/12 65   Pt is A&O x 3 left lower extremity: Incision/s is/are clean,dry.intact, and  healing without hematoma, erythema or drainage Limb is warm; with good color  Left Dorsalis Pedis pulse is palpable Left Posterior tibial pulse is  palpable  Assessment/Plan: S/p embolectomy with good flow to left foot Pt. Doing well - stable from vasc standpoint Anticoag per medicine Post-op pain is controlled Wounds are healing well No signs of compartment  syndrome PT/OT for ambulation Continue wound care as ordered  [name],[name]  12/31/2012 8:03 AM  Agree with above. I palpate the PT pulse. Incision fine.  Waverly Ferrari, MD, FACS Beeper [phone] 12/31/2012

## 2012-12-31 NOTE — Progress Notes (Signed)
Speech Language Pathology Treatment:  (pt/family education)  Patient Details Name: [name] MRN: [id number] DOB: 1921-11-16 Today's Date: 12/31/2012 Time: 1250-1305 SLP Time Calculation (min): 15 min  Assessment / Plan / Recommendation Clinical Impression  Pt moved from 38M immediately following BSE.  SLP went to new room on 4E to complete education.  Son present.  Reviewed and posted safe swallow precautions and suggestions for managing esophageal motility with pt, son, and RN.  Son reported pt having difficulty with reflux at home.  SLP indicated recommendation to repeat esophageal study.  Pt asking for water.  Pt was seated upright and given water via straw.  Cues required for small individual sips, but no overt s/s aspiration noted with any bolus.  Continue to recommend esophageal study due to continued symptoms of esophageal dysmotility.  ST to follow for diet tolerance, advancement, and pt/family education.     HPI HPI: 79 year old female aditted 12/29/12 due to abdominal pain.  PMH significant for HTN and GERD, with esophageal study (2012) which revealed flash penetration (normal for pt age), no aspiration, and decreased esophageal motility.  BSE requested to evaluate swallow function and safety due to reports of difficulty chewing, and cough with po intake.   Pertinent Vitals VSS  SLP Plan   see care plan   Recommendations Diet recommendations: Dysphagia 2 (fine chop);Thin liquid Liquids provided via: Straw Medication Administration: Whole meds with puree Supervision: Patient able to self feed;Intermittent supervision to cue for compensatory strategies Compensations: Slow rate;Small sips/bites;Follow solids with liquid Postural Changes and/or Swallow Maneuvers: Upright 30-60 min after meal;Seated upright 90 degrees              Oral Care Recommendations: Oral care Q4 per protocol;Staff/trained caregiver to provide oral care Follow up Recommendations: 24 hour  supervision/assistance    GO   Cuinn Westerhold B. Murvin Natal [facility], CCC-SLP [phone]   Leigh Aurora 12/31/2012, 1:10 PM

## 2012-12-31 NOTE — Evaluation (Signed)
Clinical/Bedside Swallow Evaluation Patient Details  Name: [name] MRN: [id number] Date of Birth: Jun 26, 1921  Today's Date: 12/31/2012 Time: 1200-1220 SLP Time Calculation (min): 20 min  Past Medical History:  Past Medical History  Diagnosis Date  . Sick sinus syndrome     s/p PPM by Dr Juanda Chance (MDT)  . Hypertension   . FHx: mastectomy     for breast cancer  . Persistent atrial fibrillation     on pradaxa  . Gastro - esophageal reflux disease   . Allergic rhinitis due to pollen   . Other specified acquired hypothyroidism   . Body mass index 28.0-28.9, adult   . Other and unspecified hyperlipidemia   . Encounter for long-term (current) use of other medications   . Other B-complex deficiencies   . Memory loss   . Pain in limb   . Dizziness and giddiness   . Diastolic dysfunction    Past Surgical History:  Past Surgical History  Procedure Laterality Date  . Pacemaker insertion      by Dr Juanda Chance for tachy/brady syndrome (MDT)  . Mastectomy    . Tonsillectomy    . Appendectomy     HPI:  79 year old female aditted 12/29/12 due to abdominal pain.  PMH significant for HTN and GERD, with esophageal study (2012) which revealed flash penetration (normal for pt age), no aspiration, and decreased esophageal motility.  BSE requested to evaluate swallow function and safety due to reports of difficulty chewing, and cough with po intake.   Assessment / Plan / Recommendation Clinical Impression  Oral care completed with suction. Pt removed upper and lower dentures for SLP to clean.  Slight asymmetry of velar elevation on pt left, otherwise oral motor strength and function WFL. No overt s/s aspiration with any consistency tested.  Pt reports solids are difficult to chew, and are "bitter".  Esophagram in 2012 revealed esophageal dysmotility, no aspiration.  Will downgrade diet to dys 2/thin liquids and post safe swallow and esophageal dysmotility precautions at [facility].  ST to follow  briefly for diet tolerance and education.    Aspiration Risk  Mild    Diet Recommendation Dysphagia 2 (Fine chop);Thin liquid   Liquid Administration via: Straw;Cup Medication Administration: Whole meds with puree Supervision: Patient able to self feed;Intermittent supervision to cue for compensatory strategies Compensations: Small sips/bites;Slow rate    Other  Recommendations Recommended Consults: Consider esophageal assessment Oral Care Recommendations: Oral care Q4 per protocol;Staff/trained caregiver to provide oral care Other Recommendations: Clarify dietary restrictions   Follow Up Recommendations  24 hour supervision/assistance    Frequency and Duration min 1 x/week  1 week   Pertinent Vitals/Pain 8/10 throat pain, RN aware    SLP Swallow Goals  see care plan   Swallow Study Prior Functional Status   History of esophageal dysmotility (2012).     General Date of Onset: 12/29/12 HPI: 79 year old female aditted 12/29/12 due to abdominal pain.  PMH significant for HTN and GERD, with esophageal study (2012) which revealed flash penetration (normal for pt age), no aspiration, and decreased esophageal motility.  BSE requested to evaluate swallow function and safety due to reports of difficulty chewing, and cough with po intake. Type of Study: Bedside swallow evaluation Previous Swallow Assessment: esophageal study 2012 Diet Prior to this Study: Dysphagia 3 (soft);Thin liquids Temperature Spikes Noted: No Respiratory Status: Room air History of Recent Intubation: No Behavior/Cognition: Alert;Cooperative;Pleasant mood Oral Cavity - Dentition: Dentures, top;Dentures, bottom Self-Feeding Abilities: Able to feed  self;Needs set up Patient Positioning: Upright in bed Baseline Vocal Quality: Clear Volitional Cough: Strong Volitional Swallow: Able to elicit    Oral/Motor/Sensory Function Labial ROM: Within Functional Limits Labial Symmetry: Within Functional Limits Labial  Strength: Within Functional Limits Labial Sensation: Within Functional Limits Lingual ROM: Within Functional Limits Lingual Symmetry: Within Functional Limits Lingual Strength: Within Functional Limits Lingual Sensation: Within Functional Limits Facial ROM: Within Functional Limits Facial Symmetry: Within Functional Limits Facial Strength: Within Functional Limits Facial Sensation: Within Functional Limits Velum: Impaired left (decreased elevation on pt left side) Mandible: Within Functional Limits   Ice Chips Ice chips: Not tested   Thin Liquid Thin Liquid: Within functional limits Presentation: Straw    Nectar Thick Nectar Thick Liquid: Not tested   Honey Thick Honey Thick Liquid: Not tested   Puree Puree: Within functional limits Presentation: Spoon   Solid   GO    Solid: Impaired Oral Phase Impairments: Other (comment) (tiny bites taken, extended oral prep.)      [name] [facility], CCC-SLP [phone]  Leigh Aurora 12/31/2012,12:33 PM

## 2013-01-01 DIAGNOSIS — I82409 Acute embolism and thrombosis of unspecified deep veins of unspecified lower extremity: Secondary | ICD-10-CM

## 2013-01-01 LAB — CBC
Hemoglobin: 9.7 g/dL — ABNORMAL LOW (ref 12.0–15.0)
MCH: 31.3 pg (ref 26.0–34.0)
MCHC: 33.2 g/dL (ref 30.0–36.0)
Platelets: 127 10*3/uL — ABNORMAL LOW (ref 150–400)
RBC: 3.1 MIL/uL — ABNORMAL LOW (ref 3.87–5.11)
RDW: 17.9 % — ABNORMAL HIGH (ref 11.5–15.5)
WBC: 18.2 10*3/uL — ABNORMAL HIGH (ref 4.0–10.5)

## 2013-01-01 LAB — GLUCOSE, CAPILLARY
Glucose-Capillary: 123 mg/dL — ABNORMAL HIGH (ref 70–99)
Glucose-Capillary: 138 mg/dL — ABNORMAL HIGH (ref 70–99)
Glucose-Capillary: 164 mg/dL — ABNORMAL HIGH (ref 70–99)

## 2013-01-01 LAB — BASIC METABOLIC PANEL
BUN: 19 mg/dL (ref 6–23)
Calcium: 9.8 mg/dL (ref 8.4–10.5)
Chloride: 110 mEq/L (ref 96–112)
Creatinine, Ser: 0.97 mg/dL (ref 0.50–1.10)
GFR calc Af Amer: 58 mL/min — ABNORMAL LOW (ref >90)
Sodium: 138 mEq/L (ref 135–145)

## 2013-01-01 LAB — LACTIC ACID, PLASMA
Lactic Acid, Venous: 0.9 mmol/L (ref 0.5–2.2)
Lactic Acid, Venous: 3.1 mmol/L — ABNORMAL HIGH (ref 0.5–2.2)

## 2013-01-01 LAB — HEPARIN LEVEL (UNFRACTIONATED): Heparin Unfractionated: 0.11 IU/mL — ABNORMAL LOW (ref 0.30–0.70)

## 2013-01-01 LAB — APTT: aPTT: 58 seconds — ABNORMAL HIGH (ref 24–37)

## 2013-01-01 MED ORDER — POLYETHYLENE GLYCOL 3350 17 G PO PACK
17.0000 g | PACK | Freq: Every day | ORAL | Status: DC
  Administered 2013-01-01 – 2013-01-03 (×3): 17 g via ORAL
  Filled 2013-01-01 (×4): qty 1

## 2013-01-01 MED ORDER — MORPHINE SULFATE 2 MG/ML IJ SOLN: 2.0000 mg | INTRAMUSCULAR | Status: DC | PRN

## 2013-01-01 MED ORDER — ISOSORB DINITRATE-HYDRALAZINE 20-37.5 MG PO TABS
1.0000 | ORAL_TABLET | Freq: Two times a day (BID) | ORAL | Status: DC
  Administered 2013-01-01 – 2013-01-04 (×6): 1 via ORAL
  Filled 2013-01-01 (×7): qty 1

## 2013-01-01 NOTE — Progress Notes (Signed)
   VASCULAR PROGRESS NOTE  SUBJECTIVE: No complaints  PHYSICAL EXAM: Filed Vitals:   12/31/12 1200 12/31/12 1253 12/31/12 2014 01/01/13 0508  BP: 146/64 143/50 112/45 122/46  Pulse:  58 71 60  Temp:  98 F (36.7 C) 98.3 F (36.8 C) 97.8 F (36.6 C)  TempSrc:  Oral Oral Oral  Resp: 18 18 20 20   Height:      Weight:      SpO2:  96% 96% 95%   Incision looks fine. Palpable left PT pulse.   LABS: Lab Results  Component Value Date   CREATININE 1.03 12/31/2012   Lab Results  Component Value Date   INR 1.38 12/29/2012   CBG (last 3)   Recent Labs  12/31/12 2106 01/01/13 0022 01/01/13 0424  GLUCAP 163* 125* 99    Active Problems:   Renal artery thrombosis   Embolism and thrombosis of celiac artery   Renal infarction  ASSESSMENT AND PLAN:  *  2 Days Post-Op s/p: Left popliteal embolectomy.    * On heparin  Cari Caraway Beeper: [phone] 01/01/2013

## 2013-01-01 NOTE — Progress Notes (Signed)
[name] PROGRESS NOTE  [name] Adkins [name]:[id number] DOB: Apr 04, 1921 DOA: 12/29/2012 PCP: Dominique Fickle, MD  BRIEF PATIENT DESCRIPTION:  79 yo with past medical history of HTN brought to [name] with LLQ abdominal pain. In ED CT reveled renal / celiac artery thrombi and infarcted left kidney. Vascular surgery was consulted and she was transferred to [name] for further management. Also found with new large LLE DVT. Patient is status post left popliteal embolectomy 11/21 and is on heparin drip.  Assessment/Plan: 1-Renal/celiac artery stenosis with renal infarct: vascular surgery on board. Currently recommending heparin drip. -renal function stable overall, but patient with decrease urine output -follow renal function trend -will avoid cozaar -foley in place  2- LLE thrombosis: per vascular surgery -s/p embolectomy on 11/21 -continue heparin drip -will discuss with vasc surgery long term anticoagulation rec's -but with hx of AF and failure to pradaxa will recommend xarelto  3-Atrial fibrillation: rate controlled. -continue diltiazem -continue heparin drip for now  4-GERD: continue PPI  5-Hypothyroidism: continue synthroid  6-HTN: stable overall. -continue cardizem -started on bidil  7-urinary retention: foley in place, IVF's increased. -will follow volume closely  8-diastolic heart failure: chronic and compensated. Preserved EF from last 2-D echo on July 2011(60-65%) -no SOB, no JVD  9-constipation: will start miralax and colace  10-physical deconditioning: per PT will need SNF at discharge. SW order in place.  Code Status: partial code (no intubation) Family Communication: *no family at bedside Disposition Plan: will need SNF at discharge   Consultants:  CCM  Vascular surgery  Procedures:  LLE endarterectomy (11/21)  Antibiotics:  None   HPI/Subjective: Afebrile, no acute distress; able to follow commands. PT recommending SNF  Objective: Filed  Vitals:   01/01/13 1312  BP: 139/41  Pulse: 59  Temp: 97.5 F (36.4 C)  Resp: 18    Intake/Output Summary (Last 24 hours) at 01/01/13 1723 Last data filed at 01/01/13 1314  Gross per 24 hour  Intake    120 ml  Output    500 ml  Net   -380 ml   Filed Weights   12/29/12 2137 12/30/12 0350 12/31/12 0440  Weight: 61.3 kg (135 lb 2.3 oz) 62.2 kg (137 lb 2 oz) 63.9 kg (140 lb 14 oz)    Exam:   General:  Afebrile, no acute distress or complaints  Cardiovascular: S1 and S2, no rubs or gallops, positive murmur  Respiratory: CTA bilaterally  Abdomen: soft, NT, ND, positive BS  Musculoskeletal: mild discomfort on palpation of LLE, trace edema, no cyanosis  Data Reviewed: Basic Metabolic Panel:  Recent Labs Lab 12/29/12 2200 12/30/12 0843 12/31/12 0115 12/31/12 0418 12/31/12 1440 01/01/13 0905  NA 138 140 141 141 136 138  K 3.6 3.5 3.7 3.3* 3.7 3.6  CL 99 104 106 107 102 110  CO2 27 22 27 24 19 19   GLUCOSE 241* 148* 133* 117* 220* 110*  BUN 17 15 16 17 20 19   CREATININE 1.06 1.02 1.05 1.01 1.03 0.97  CALCIUM 10.6* 10.7* 10.1 9.9 10.3 9.8  MG 1.9  --   --   --   --   --   PHOS 3.1  --   --   --   --   --    Liver Function Tests:  Recent Labs Lab 12/29/12 2200 12/30/12 0843 12/31/12 0418  AST 196* 155* 74*  ALT 73* 70* 50*  ALKPHOS 83 80 80  BILITOT 0.8 1.0 0.6  PROT 6.5 6.4 5.4*  ALBUMIN 3.2*  3.0* 2.2*   CBC:  Recent Labs Lab 12/29/12 2200 12/30/12 0843 12/31/12 0115 12/31/12 0418 01/01/13 0905  WBC 13.5* 22.0* 22.2* 20.9* 18.2*  NEUTROABS 11.1*  --   --   --   --   HGB 13.4 13.1 11.1* 11.2* 9.7*  HCT 38.7 37.6 32.3* 33.0* 29.2*  MCV 91.9 91.5 93.6 93.5 94.2  PLT 165 161 132* 134* 127*   CBG:  Recent Labs Lab 01/01/13 0022 01/01/13 0424 01/01/13 0736 01/01/13 1131 01/01/13 1631  GLUCAP 125* 99 123* 138* 164*    Recent Results (from the past 240 hour(s))  MRSA PCR SCREENING     Status: None   Collection Time    12/29/12  8:57 PM       Result Value Range Status   MRSA by PCR NEGATIVE  NEGATIVE Final   Comment:            The GeneXpert MRSA Assay (FDA     approved for NASAL specimens     only), is one component of a     comprehensive MRSA colonization     surveillance program. It is not     intended to diagnose MRSA     infection nor to guide or     monitor treatment for     MRSA infections.     Studies: No results found.  Scheduled Meds: . diltiazem  180 mg Oral Daily  . docusate sodium  100 mg Oral Daily  . escitalopram  10 mg Oral Daily  . insulin aspart  0-15 Units Subcutaneous Q4H  . isosorbide-hydrALAZINE  1 tablet Oral BID  . levothyroxine  100 mcg Oral QAC breakfast  . pantoprazole  40 mg Oral Daily   Continuous Infusions: . sodium chloride 75 mL/hr at 12/29/12 2228  . sodium chloride 75 mL/hr at 12/30/12 2034  . heparin 1,000 Units/hr (01/01/13 1041)    Time spent: >30 minutes   [name] Adkins  [name] Pager [phone]. If 7PM-7AM, please contact night-coverage at [email or web address], password [name] 01/01/2013, 5:23 PM  LOS: 3 days

## 2013-01-01 NOTE — Progress Notes (Signed)
ANTICOAGULATION CONSULT NOTE - Follow Up Consult  Pharmacy Consult for heparin Indication: renal/celiac thrombi  No Known Allergies  Patient Measurements: Height: 5\' 4"  (162.6 cm) Weight:  (the bed scale is note working) IBW/kg (Calculated) : 54.7 Heparin Dosing Weight: 60 kg  Vital Signs: Temp: 97.8 F (36.6 C) (11/23 0508) Temp src: Oral (11/23 0508) BP: 122/46 mmHg (11/23 0508) Pulse Rate: 60 (11/23 0508)  Labs:  Recent Labs  12/29/12 2200 12/30/12 0843 12/31/12 0115 12/31/12 0418 12/31/12 0920 12/31/12 1440 01/01/13 0905  HGB 13.4 13.1 11.1* 11.2*  --   --  9.7*  HCT 38.7 37.6 32.3* 33.0*  --   --  29.2*  PLT 165 161 132* 134*  --   --  127*  APTT 116* 129*  --  68*  --   --  58*  LABPROT 16.6*  --   --   --   --   --   --   INR 1.38  --   --   --   --   --   --   HEPARINUNFRC  --  1.01*  --   --  0.29*  --  0.11*  CREATININE 1.06 1.02 1.05 1.01  --  1.03 0.97    Estimated Creatinine Clearance: 34 ml/min (by C-G formula based on Cr of 0.97).   Medications:  Scheduled:  . diltiazem  180 mg Oral Daily  . docusate sodium  100 mg Oral Daily  . escitalopram  10 mg Oral Daily  . insulin aspart  0-15 Units Subcutaneous Q4H  . levothyroxine  100 mcg Oral QAC breakfast  . losartan  50 mg Oral Daily  . pantoprazole  40 mg Oral Daily   Infusions:  . sodium chloride 75 mL/hr at 12/29/12 2228  . sodium chloride 75 mL/hr at 12/30/12 2034  . heparin 850 Units/hr (12/31/12 1200)    Assessment: 79 yo female with renal/celiac thrombi is currently on subtherapeutic heparin.  Heparin level is 0.11.  Per RN, no problem with infusion.  The pradaxa effect is probably gone from his system. Goal of Therapy:  Heparin level 0.3-0.7 units/ml Monitor platelets by anticoagulation protocol: Yes   Plan:  - inc heparin gtt to 1000 units/hr  - 8hr heparin level - monitor for signs of bleeding   Esmeralda Blanford, Tsz-Yin 01/01/2013,10:16 AM

## 2013-01-01 NOTE — Progress Notes (Signed)
Pt.'s Daughter in law reported pt.'s left arm looked swollen near I.V. Site at about 1730 I confirmed arm was swollen and had possible hematoma. Pt. Denied any pain or discomfort at the site. I immediately stopped I.V. Which was infusing Normal Saline 75 ml/hr and Heparin 10 ml/hr and removed I.V. And began alternating warm packs and cold packs to the site. Pt. Was a hard stick, I.V. Team was paged and started new I.V., I restarted infusions at about 1830 and notified Pharmacist and MD. Pt. Stable and reported no further signs/symptoms of distress.

## 2013-01-01 NOTE — Evaluation (Signed)
Physical Therapy Evaluation Patient Details Name: [name] MRN: [id number] DOB: 15-May-1921 Today's Date: 01/01/2013 Time: 1324-4010 PT Time Calculation (min): 28 min  PT Assessment / Plan / Recommendation History of Present Illness  The patient is an 79 yo female admitted with approximately one week of abdominal pain.  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  She is on anticoagulation for atrial fibrillation.  During vascular surgery evaluation, she reported approximately a three-week history of cramping in her left leg as well as having trouble walking.  Patient with nonpalpable dorsalis pedis pulse on the left. Doppler ultrasound showed occlusion of the distal superficial femoral artery with reconstitution of her tibial vessels. Patient s/p embolectomies LLE.  Clinical Impression  Patient presents with problems listed below.  Will benefit from acute PT to maximize functional mobility prior to discharge.  Patient does not have 24 hour assist available.  Recommend SNF at discharge for continued therapy.    PT Assessment  Patient needs continued PT services    Follow Up Recommendations  SNF;Supervision/Assistance - 24 hour    Does the patient have the potential to tolerate intense rehabilitation      Barriers to Discharge Inaccessible home environment Does not have 24 houir assist available.    Equipment Recommendations  None recommended by PT    Recommendations for Other Services     Frequency Min 3X/week    Precautions / Restrictions Precautions Precautions: Fall Restrictions Weight Bearing Restrictions: No   Pertinent Vitals/Pain Pain in LLE limiting mobility      Mobility  Bed Mobility Bed Mobility: Supine to Sit;Sitting - Scoot to Edge of Bed Supine to Sit: 3: Mod assist;HOB elevated Sitting - Scoot to Edge of Bed: 4: Min assist;With rail Details for Bed Mobility Assistance: Verbal and tactile cues  for technique.  Assist to move LE's off of bed and to raise trunk to sitting position.   Transfers Transfers: Sit to Stand;Stand to [employer] Transfers Sit to Stand: 2: Max assist;With upper extremity assist;From bed Stand to Sit: 3: Mod assist;To bed;With armrests;To chair/3-in-1 Stand Pivot Transfers: 2: Max assist Details for Transfer Assistance: Verbal and tactile cues for hand placement.  Patient continued to reach for RW with hands.  Max assist to rise to standing.  Patient verbalizing pain in LLE, and holding LLE off of floor initially.  On second attempt to stand, patient able to bear weight on LLE.  Able to take a few steps with max assist to pivot to chair. Ambulation/Gait Ambulation/Gait Assistance: Not tested (comment)    Exercises     PT Diagnosis: Difficulty walking;Generalized weakness;Acute pain;Altered mental status  PT Problem List: Decreased strength;Decreased activity tolerance;Decreased balance;Decreased mobility;Decreased cognition;Decreased knowledge of use of DME;Pain PT Treatment Interventions: DME instruction;Gait training;Functional mobility training;Cognitive remediation;Patient/family education     PT Goals(Current goals can be found in the care plan section) Acute Rehab PT Goals Patient Stated Goal: To go home PT Goal Formulation: With patient/family Time For Goal Achievement: 01/15/13 Potential to Achieve Goals: Good  Visit Information  Last PT Received On: 01/01/13 Assistance Needed: +1 History of Present Illness: The patient is an 79 yo female admitted with approximately one week of abdominal pain.  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  She is on anticoagulation for atrial fibrillation.  During vascular surgery evaluation, she reported approximately a three-week history of cramping  in her left leg as well as having trouble walking.  Patient with nonpalpable dorsalis pedis pulse on the  left. Doppler ultrasound showed occlusion of the distal superficial femoral artery with reconstitution of her tibial vessels. Patient s/p embolectomies LLE.       Prior Functioning  Home Living Family/patient expects to be discharged to:: Skilled nursing facility Living Arrangements: Alone Home Equipment: Dan Humphreys - 2 wheels;Cane - single point;Bedside commode;Toilet riser Additional Comments: Supportive son's providing care.  However unable to provide 24 hour assist. Prior Function Level of Independence: Needs assistance Gait / Transfers Assistance Needed: Min assist with RW ADL's / Homemaking Assistance Needed: Assist needed for meal prep and housekeeping.  Has aide for bathing and dressing Communication Communication: No difficulties    Cognition  Cognition Arousal/Alertness: Awake/alert Behavior During Therapy: Anxious Overall Cognitive Status: Impaired/Different from baseline Area of Impairment: Orientation;Memory;Awareness;Problem solving Orientation Level: Disoriented to;Time;Place;Situation Memory: Decreased short-term memory Problem Solving: Slow processing;Requires verbal cues    Extremity/Trunk Assessment Upper Extremity Assessment Upper Extremity Assessment: Generalized weakness Lower Extremity Assessment Lower Extremity Assessment: Generalized weakness;LLE deficits/detail LLE Deficits / Details: Decreased strength related to pain from surgery.  Grossly 3+/5. LLE: Unable to fully assess due to pain   Balance Balance Balance Assessed: Yes Static Sitting Balance Static Sitting - Balance Support: Right upper extremity supported;Feet supported Static Sitting - Level of Assistance: 5: Stand by assistance Static Sitting - Comment/# of Minutes: 3 minutes.  Patient with flexed posture, initially leaning posteriorly.  Able to maintain balance with supervision. Static Standing Balance Static Standing - Balance Support: Bilateral upper extremity supported Static Standing -  Level of Assistance: 3: Mod assist Static Standing - Comment/# of Minutes: < 1 minute.  Difficulty bearing weight on LLE due to pain.  Patient leaning posteriorly requiring assist to prevent fall.  End of Session PT - End of Session Equipment Utilized During Treatment: Gait belt Activity Tolerance: Patient limited by pain;Patient limited by fatigue Patient left: in chair;with call bell/phone within reach;with family/visitor present Nurse Communication: Mobility status  GP     Vena Austria 01/01/2013, 10:36 AM Durenda Hurt. Renaldo Fiddler, [facility] Acute Rehab Services Pager [phone]

## 2013-01-02 DIAGNOSIS — R1310 Dysphagia, unspecified: Secondary | ICD-10-CM

## 2013-01-02 DIAGNOSIS — Z48812 Encounter for surgical aftercare following surgery on the circulatory system: Secondary | ICD-10-CM

## 2013-01-02 LAB — BASIC METABOLIC PANEL
CO2: 21 mEq/L (ref 19–32)
Calcium: 9.3 mg/dL (ref 8.4–10.5)
Chloride: 108 mEq/L (ref 96–112)
Creatinine, Ser: 0.94 mg/dL (ref 0.50–1.10)
GFR calc Af Amer: 61 mL/min — ABNORMAL LOW (ref >90)
Glucose, Bld: 123 mg/dL — ABNORMAL HIGH (ref 70–99)
Sodium: 137 mEq/L (ref 135–145)

## 2013-01-02 LAB — GLUCOSE, CAPILLARY
Glucose-Capillary: 186 mg/dL — ABNORMAL HIGH (ref 70–99)
Glucose-Capillary: 105 mg/dL — ABNORMAL HIGH (ref 70–99)
Glucose-Capillary: 176 mg/dL — ABNORMAL HIGH (ref 70–99)
Glucose-Capillary: 132 mg/dL — ABNORMAL HIGH (ref 70–99)
Glucose-Capillary: 143 mg/dL — ABNORMAL HIGH (ref 70–99)

## 2013-01-02 LAB — HEPARIN LEVEL (UNFRACTIONATED)
Heparin Unfractionated: 0.14 IU/mL — ABNORMAL LOW (ref 0.30–0.70)
Heparin Unfractionated: 0.36 IU/mL (ref 0.30–0.70)
Heparin Unfractionated: 0.45 IU/mL (ref 0.30–0.70)

## 2013-01-02 LAB — CBC
Hemoglobin: 8.8 g/dL — ABNORMAL LOW (ref 12.0–15.0)
MCH: 31.7 pg (ref 26.0–34.0)
MCHC: 33.5 g/dL (ref 30.0–36.0)
Platelets: 135 10*3/uL — ABNORMAL LOW (ref 150–400)
RBC: 2.78 MIL/uL — ABNORMAL LOW (ref 3.87–5.11)

## 2013-01-02 LAB — APTT: aPTT: 81 seconds — ABNORMAL HIGH (ref 24–37)

## 2013-01-02 NOTE — Progress Notes (Signed)
ANTICOAGULATION CONSULT NOTE - Follow Up Consult  Pharmacy Consult for heparin Indication: atrial fibrillation and renal/celiac thrombosis  Labs:  Recent Labs  12/31/12 0418 12/31/12 0920 12/31/12 1440 01/01/13 0905 01/02/13 0150  HGB 11.2*  --   --  9.7* 8.8*  HCT 33.0*  --   --  29.2* 26.3*  PLT 134*  --   --  127* 135*  APTT 68*  --   --  58* 81*  HEPARINUNFRC  --  0.29*  --  0.11* 0.14*  CREATININE 1.01  --  1.03 0.97 0.94    Assessment: 79yo female remains subtherapeutic on heparin after rate increases.  Goal of Therapy:  Heparin level 0.3-0.7 units/ml   Plan:  Will increase gtt by 3 units/kg/hr to 1200 units/hr and check level in 8hr.  Vernard Gambles, PharmD, BCPS  01/02/2013,3:13 AM

## 2013-01-02 NOTE — Progress Notes (Signed)
    Subjective  - POD #3, s/p L LE thromboembolectomy  Reports less pain in left leg abd pain improved   Physical Exam:  Abd soft L LE incision with dry dressing  Palpable Bilateral DP Pulse       Assessment/Plan:  POD #3  Doing well.  Will need long term anticoagulation.  Had failure of Pradaxa, consider Xaralto vs coumadin.  Would check echo to r/o residual cardiac thrombus. Needs PT to help with mobility  BRABHAM IV, V. WELLS 01/02/2013 9:10 PM --  Filed Vitals:   01/02/13 2049  BP: 121/53  Pulse: 59  Temp: 98.4 F (36.9 C)  Resp: 18    Intake/Output Summary (Last 24 hours) at 01/02/13 2110 Last data filed at 01/02/13 1900  Gross per 24 hour  Intake    600 ml  Output    320 ml  Net    280 ml     Laboratory CBC    Component Value Date/Time   WBC 15.7* 01/02/2013 0150   HGB 8.8* 01/02/2013 0150   HCT 26.3* 01/02/2013 0150   PLT 135* 01/02/2013 0150    BMET    Component Value Date/Time   NA 137 01/02/2013 0150   K 3.7 01/02/2013 0150   CL 108 01/02/2013 0150   CO2 21 01/02/2013 0150   GLUCOSE 123* 01/02/2013 0150   BUN 18 01/02/2013 0150   CREATININE 0.94 01/02/2013 0150   CALCIUM 9.3 01/02/2013 0150   GFRNONAA 52* 01/02/2013 0150   GFRAA 61* 01/02/2013 0150    COAG Lab Results  Component Value Date   INR 1.38 12/29/2012   INR 2.1 03/11/2009   INR 1.06 12/28/2008   No results found for this basename: PTT    Antibiotics Anti-infectives   Start     Dose/Rate Route Frequency Ordered Stop   12/31/12 0600  cefUROXime (ZINACEF) 1.5 g in dextrose 5 % 50 mL IVPB  Status:  Discontinued     1.5 g 100 mL/hr over 30 Minutes Intravenous On call to O.R. 12/30/12 1547 12/31/12 1045   12/30/12 2015  cefUROXime (ZINACEF) 1.5 g in dextrose 5 % 50 mL IVPB     1.5 g 100 mL/hr over 30 Minutes Intravenous Every 12 hours 12/30/12 2012 12/31/12 0859       V. Charlena Cross, M.D. [facility] Office:  [phone] Pager:  [phone]

## 2013-01-02 NOTE — Progress Notes (Signed)
[facility] PROGRESS NOTE  [name] ZOX:[id number] DOB: 10/27/1921 DOA: 12/29/2012 PCP: Renae Fickle, MD  BRIEF PATIENT DESCRIPTION:  79 yo with past medical history of HTN brought to [facility] ED with LLQ abdominal pain. In ED CT reveled renal / celiac artery thrombi and infarcted left kidney. Vascular surgery was consulted and she was transferred to [facility] for further management. Also found with new large LLE DVT. Patient is status post left popliteal embolectomy 11/21 and is on heparin drip with plans to transition to xarelto at discharge and to go to SNF for rehabilitation.  Assessment/Plan: 1-Renal/celiac artery stenosis with renal infarct: vascular surgery on board. Currently recommending heparin drip. -renal function stable overall, but patient with decrease urine output -follow renal function trend -will avoid cozaar -foley in place  2- LLE thrombosis: per vascular surgery -s/p embolectomy on 11/21 -continue heparin drip -will discuss with vasc surgery long term anticoagulation rec's -but with hx of AF and failure to pradaxa will recommend xarelto at discharge  3-Atrial fibrillation: rate controlled. -continue diltiazem -continue heparin drip for now  4-GERD: continue PPI  5-Hypothyroidism: continue synthroid  6-HTN: stable overall. -continue cardizem and bidil  7-urinary retention: foley in place, IVF's increased. -will follow volume closely -change IVF's to 75cc/hr  8-diastolic heart failure: chronic and compensated. Preserved EF from last 2-D echo on July 2011(60-65%) -no SOB, no JVD  9-constipation: will continue miralax and colace  10-physical deconditioning: per PT will need SNF at discharge. SW order in place.  11-dysphagia: appears to be due to dysmotility, but has hx of bad reflux and stricture in the past. Will continue PPI and ask GI to see. -continue diet as recommended by SPL  Code Status: partial code (no intubation) Family Communication: *no  family at bedside Disposition Plan: will need SNF at discharge   Consultants:  CCM  Vascular surgery  GI  Procedures:  LLE endarterectomy (11/21)  Antibiotics:  None   HPI/Subjective: Afebrile, no acute distress; able to follow commands. PT recommending SNF at discharge. Patient with complaints of feeling food stuck in her chest. Eating poorly  Objective: Filed Vitals:   01/02/13 1500  BP: 120/60  Pulse: 62  Temp: 97 F (36.1 C)  Resp: 18    Intake/Output Summary (Last 24 hours) at 01/02/13 1840 Last data filed at 01/02/13 1406  Gross per 24 hour  Intake    478 ml  Output    320 ml  Net    158 ml   Filed Weights   12/31/12 0440 01/01/13 1943 01/02/13 0539  Weight: 63.9 kg (140 lb 14 oz) 69.8 kg (153 lb 14.1 oz) 71.1 kg (156 lb 12 oz)    Exam:   General:  Afebrile, no acute distress or complaints  Cardiovascular: S1 and S2, no rubs or gallops, positive murmur  Respiratory: CTA bilaterally  Abdomen: soft, NT, ND, positive BS  Musculoskeletal: mild discomfort on palpation of LLE, trace edema, no cyanosis  Data Reviewed: Basic Metabolic Panel:  Recent Labs Lab 12/29/12 2200  12/31/12 0115 12/31/12 0418 12/31/12 1440 01/01/13 0905 01/02/13 0150  NA 138  < > 141 141 136 138 137  K 3.6  < > 3.7 3.3* 3.7 3.6 3.7  CL 99  < > 106 107 102 110 108  CO2 27  < > 27 24 19 19 21   GLUCOSE 241*  < > 133* 117* 220* 110* 123*  BUN 17  < > 16 17 20 19 18   CREATININE 1.06  < >  1.05 1.01 1.03 0.97 0.94  CALCIUM 10.6*  < > 10.1 9.9 10.3 9.8 9.3  MG 1.9  --   --   --   --   --   --   PHOS 3.1  --   --   --   --   --   --   < > = values in this interval not displayed. Liver Function Tests:  Recent Labs Lab 12/29/12 2200 12/30/12 0843 12/31/12 0418  AST 196* 155* 74*  ALT 73* 70* 50*  ALKPHOS 83 80 80  BILITOT 0.8 1.0 0.6  PROT 6.5 6.4 5.4*  ALBUMIN 3.2* 3.0* 2.2*   CBC:  Recent Labs Lab 12/29/12 2200 12/30/12 0843 12/31/12 0115 12/31/12 0418  01/01/13 0905 01/02/13 0150  WBC 13.5* 22.0* 22.2* 20.9* 18.2* 15.7*  NEUTROABS 11.1*  --   --   --   --   --   HGB 13.4 13.1 11.1* 11.2* 9.7* 8.8*  HCT 38.7 37.6 32.3* 33.0* 29.2* 26.3*  MCV 91.9 91.5 93.6 93.5 94.2 94.6  PLT 165 161 132* 134* 127* 135*   CBG:  Recent Labs Lab 01/01/13 2353 01/02/13 0519 01/02/13 0832 01/02/13 1155 01/02/13 1611  GLUCAP 128* 105* 130* 176* 132*    Recent Results (from the past 240 hour(s))  MRSA PCR SCREENING     Status: None   Collection Time    12/29/12  8:57 PM      Result Value Range Status   MRSA by PCR NEGATIVE  NEGATIVE Final   Comment:            The GeneXpert MRSA Assay (FDA     approved for NASAL specimens     only), is one component of a     comprehensive MRSA colonization     surveillance program. It is not     intended to diagnose MRSA     infection nor to guide or     monitor treatment for     MRSA infections.     Studies: No results found.  Scheduled Meds: . diltiazem  180 mg Oral Daily  . docusate sodium  100 mg Oral Daily  . escitalopram  10 mg Oral Daily  . insulin aspart  0-15 Units Subcutaneous Q4H  . isosorbide-hydrALAZINE  1 tablet Oral BID  . levothyroxine  100 mcg Oral QAC breakfast  . pantoprazole  40 mg Oral Daily  . polyethylene glycol  17 g Oral Daily   Continuous Infusions: . sodium chloride 75 mL/hr at 12/29/12 2228  . sodium chloride 75 mL/hr at 12/30/12 2034  . heparin 1,200 Units/hr (01/02/13 1406)    Time spent: >30 minutes   Tameeka Luo  [facility] Pager [phone]. If 7PM-7AM, please contact night-coverage at [email or web address], password [facility] 01/02/2013, 6:40 PM  LOS: 4 days

## 2013-01-02 NOTE — Progress Notes (Signed)
VASCULAR LAB PRELIMINARY  ARTERIAL  ABI completed:    RIGHT    LEFT    PRESSURE WAVEFORM  PRESSURE WAVEFORM  BRACHIAL 125 Triphasic BRACHIAL 117 Triphasic  DP 180 Triphasic DP 151 Triphasic  PT >300 Triphasic PT 137 Triphasic    RIGHT LEFT  ABI PT N/A DP 1.44 1.21   ABIs and Doppler waveforms are within normal limits post operative at rest.  Jarrett Chicoine, RVS 01/02/2013, 11:12 AM

## 2013-01-02 NOTE — Progress Notes (Signed)
OT Cancellation Note  Patient Details Name: [name] MRN: [id number] DOB: 01-Oct-1921   Cancelled Treatment:     Defer to SNF, due to pt lives alone and is only minimally able to move at present when up on her feet thus affecting her ability to care for herself. Acute OT will sign off.  Evette Georges [phone] 01/02/2013, 8:13 AM

## 2013-01-02 NOTE — Progress Notes (Signed)
Physical Therapy Treatment Patient Details Name: [name] MRN: [id number] DOB: 10-03-1921 Today's Date: 01/02/2013 Time: 8295-6213 PT Time Calculation (min): 24 min  PT Assessment / Plan / Recommendation  History of Present Illness The patient is an 79 yo female admitted with approximately one week of abdominal pain.  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  She is on anticoagulation for atrial fibrillation.  During vascular surgery evaluation, she reported approximately a three-week history of cramping in her left leg as well as having trouble walking.  Patient with nonpalpable dorsalis pedis pulse on the left. Doppler ultrasound showed occlusion of the distal superficial femoral artery with reconstitution of her tibial vessels. Patient s/p embolectomies LLE.   PT Comments   Pt making slow progress  Follow Up Recommendations  SNF     Does the patient have the potential to tolerate intense rehabilitation     Barriers to Discharge        Equipment Recommendations  None recommended by PT    Recommendations for Other Services    Frequency Min 3X/week   Progress towards PT Goals Progress towards PT goals: Progressing toward goals  Plan Current plan remains appropriate    Precautions / Restrictions Precautions Precautions: Fall   Pertinent Vitals/Pain Lt leg sore with activity. Repositioned.    Mobility  Bed Mobility Supine to Sit: 3: Mod assist;HOB elevated Sitting - Scoot to Edge of Bed: 3: Mod assist;With rail Details for Bed Mobility Assistance: Verbal and tactile cues for technique.  Assist to move LE's off of bed and to raise trunk to sitting position.   Transfers Sit to Stand: 2: Max assist;With upper extremity assist;From bed;With armrests;From chair/3-in-1 Stand to Sit: 3: Mod assist;To bed;With armrests;To chair/3-in-1;With upper extremity assist Details for Transfer Assistance: Verbal/tactile cues for  hand placement Ambulation/Gait Ambulation/Gait Assistance: 3: Mod assist Ambulation Distance (Feet): 5 Feet Assistive device: 4-wheeled walker Ambulation/Gait Assistance Details: verbal cues for technique Gait Pattern: Step-through pattern;Decreased step length - left;Decreased step length - right;Shuffle Gait velocity: slow    Exercises     PT Diagnosis:    PT Problem List:   PT Treatment Interventions:     PT Goals (current goals can now be found in the care plan section)    Visit Information  Last PT Received On: 01/02/13 Assistance Needed: +2 (to manage lines and bring chair behind) History of Present Illness: The patient is an 79 yo female admitted with approximately one week of abdominal pain.  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  She is on anticoagulation for atrial fibrillation.  During vascular surgery evaluation, she reported approximately a three-week history of cramping in her left leg as well as having trouble walking.  Patient with nonpalpable dorsalis pedis pulse on the left. Doppler ultrasound showed occlusion of the distal superficial femoral artery with reconstitution of her tibial vessels. Patient s/p embolectomies LLE.    Subjective Data      Cognition  Cognition Arousal/Alertness: Awake/alert Behavior During Therapy: WFL for tasks assessed/performed Orientation Level: Disoriented to;Place;Time Memory: Decreased short-term memory Problem Solving: Requires verbal cues;Requires tactile cues    Balance  Static Standing Balance Static Standing - Balance Support: Bilateral upper extremity supported Static Standing - Level of Assistance: 3: Mod assist;4: Min assist Static Standing - Comment/# of Minutes: Posterior lean initially  End of Session PT - End of  Session Equipment Utilized During Treatment: Gait belt Activity Tolerance: Patient limited by fatigue Patient left: in chair;with call  bell/phone within reach Nurse Communication: Mobility status   GP     [facility] 01/02/2013, 3:39 PM  [facility] PT [phone]

## 2013-01-02 NOTE — Progress Notes (Signed)
SLP Cancellation Note  Patient Details Name: MATINA RODIER MRN: [id number] DOB: [birth year]   Cancelled treatment:        Attempted to see pt. For f/u regarding esophageal dysphagia management.  Pt. Currently gone for Ultrasound, per family member.  Family also reports that pt. Appears to be tolerating chopped/minced diet better than solid foods, and states that MD is considering getting a EGD.  SLP will f/u 11/25.   Maryjo Rochester T 01/02/2013, 10:36 AM

## 2013-01-02 NOTE — Progress Notes (Signed)
ANTICOAGULATION CONSULT NOTE - Follow Up Consult  Pharmacy Consult for Heparin Indication: Afib, renal thrombus, DVT  No Known Allergies  Patient Measurements: Height: 5\' 4"  (162.6 cm) Weight: 156 lb 12 oz (71.1 kg) (bed scale) IBW/kg (Calculated) : 54.7 Heparin Dosing Weight: 60 kg  Vital Signs: Temp: 97.3 F (36.3 C) (11/24 0539) Temp src: Oral (11/24 0539) Pulse Rate: 59 (11/24 0539)  Labs:  Recent Labs  12/31/12 0418  12/31/12 1440 01/01/13 0905 01/02/13 0150 01/02/13 1141  HGB 11.2*  --   --  9.7* 8.8*  --   HCT 33.0*  --   --  29.2* 26.3*  --   PLT 134*  --   --  127* 135*  --   APTT 68*  --   --  58* 81*  --   HEPARINUNFRC  --   < >  --  0.11* 0.14* 0.36  CREATININE 1.01  --  1.03 0.97 0.94  --   < > = values in this interval not displayed.  Estimated Creatinine Clearance: 39.3 ml/min (by C-G formula based on Cr of 0.94).   Assessment: 79 yo F brought to [facility] ED with LLQ abdominal pain. 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 She was then transferred to [facility] for further management. Patient was on pradaxa 150 mg twice daily PTA for Afib. Per patient's relative, last dose of pradaxa was taken around 1900 on 11/19.   PMH: HTN, Afib, Sick sinus syndrome, Memory loss, diastolic dysfunction, GERD, hypothyroidism, HLD  Anticoagulation: (PTA dabigatran, 11/20 BL aPTT 116), Hep gtt for renal/celiac thrombi. New large LLE DVT. S/p left popliteal embolectomy 11/21. Heparin level 0.36. Hgb down to 8.8. Plts 135 ok.  Infectious Disease: afeb, WBC 15.7 slowly decreasing. LA up to 3.1 on 11/23.  Cardiovascular: HTN, Afib, SSS, HLD. VSS. Meds: Diltiazem, Bidil  Endocrinology: CBG 105-176, on SSI, levothyroxine.   Gastrointestinal / Nutrition: po PPI, docusate, Miralax  Neurology: lexapro resumed  Nephrology: renal thrombus, Scr 0.94  Pulmonary: RA 95%  Hematology / Oncology: Hgb down to 8.8  Hepatobili: 11/22 elevated  AST/ALT, INR 1.38  PTA Medication Issues: oscal, dabigatran, lasix, claritin, potassium, vit B-12, vit D  Best Practices: hep gtt, PPI    Goal of Therapy:  Heparin level 0.3-0.7 units/ml Monitor platelets by anticoagulation protocol: Yes   Plan:  Continue IV heparin at 1200 units/hr Will recheck heparin level in 6 hrs to re-confirm stays in goal   [name] S. Merilynn Finland, PharmD, BCPS Clinical Staff Pharmacist Pager [phone]  [name] 01/02/2013,1:19 PM

## 2013-01-02 NOTE — Consult Note (Signed)
[facility] Consult: 2:31 PM 01/02/2013  LOS: 4 days    Referring Provider: Dr Vassie Loll, [facility]  Primary Care Physician:  Renae Fickle, MD Primary Gastroenterologist:  In [place]:  Dr Chales Abrahams.  Also seen in 2006, 2007, 2012 by Dr Christella Hartigan.   Reason for Consultation:  Dysphagia.    HPI: [name] is a 78 y.o. female.  S/p pacemaker in 2008 for SS.  Chronic anticoagulation for A fib. Diastolic dysfunction.   Admitted 11/20 with LLQ abdominal pain.  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  Sustained left kidney infarct.  S/p 12/30/12  left superficial femoral, popliteal, anterior tibial, peroneal artery and embolectomy  #2: Left below knee popliteal artery patch angioplasty by Dr Myra Gianotti.   She informs the MD that she is having trouble swallowing solids.  They feel like they stick in her esophagus at level of mid neck.  Able to swallow liquids and pudding textures.  No reguritation or vomiting, no hx food impactions. Has had 3 EGDs with esophageal dilatations over the past 5 years by Dr Chales Abrahams.  The last was done several months ago.  This latest, unlike previous dilations, did not provide much benefit and any benefit lasted only a couple of weeks.  She has decreased appetite and po intake with 20 # weight loss over at least 12 months.  No PPI on home med list.  Not a lot of epigastric pain radiating to which has been problem in past. No black stools, + intermittent constipation but no BPRShe was due to see Dr Chales Abrahams next week Pt had esophagram in 10/2010 that showed generalized decreased esophageal motility, occasion tertiary contractions, flash penetration to epiglottis but no aspiration.  Normal EGD in 2006 as evaluation for epigastric pain.  Says she had colonoscopy in  . Within last 5 to 10 years.  No polyps.   Past Medical History  Diagnosis Date  . Sick sinus syndrome     s/p PPM by Dr Juanda Chance (MDT)  . Hypertension   . FHx: mastectomy     for breast cancer  . Persistent atrial fibrillation     on pradaxa  . Gastro - esophageal reflux disease   . Allergic rhinitis due to pollen   . Other specified acquired hypothyroidism   . Body mass index 28.0-28.9, adult   . Other and unspecified hyperlipidemia   . Encounter for long-term (current) use of other medications   . Other B-complex deficiencies   . Memory loss   . Pain in limb   . Dizziness and giddiness   . Diastolic dysfunction     Past Surgical History  Procedure Laterality Date  . Pacemaker insertion      by Dr Juanda Chance for tachy/brady syndrome (MDT)  . Mastectomy    . Tonsillectomy    . Appendectomy      Prior to Admission medications   Medication Sig Start Date End Date Taking? Authorizing Provider  calcium carbonate (OS-CAL - DOSED IN MG OF ELEMENTAL CALCIUM) 1250 MG tablet Take 1 tablet by  mouth daily with breakfast.   Yes Historical Provider, MD  dabigatran (PRADAXA) 150 MG CAPS Take 1 capsule (150 mg total) by mouth 2 (two) times daily. 01/15/12  Yes Hillis Range, MD  diltiazem (DILACOR XR) 180 MG 24 hr capsule Take 180 mg by mouth daily.   Yes Historical Provider, MD  escitalopram (LEXAPRO) 10 MG tablet Take 10 mg by mouth daily.   Yes Historical Provider, MD  furosemide (LASIX) 40 MG tablet Take 40 mg by mouth 2 (two) times daily.   Yes Historical Provider, MD  levothyroxine (SYNTHROID, LEVOTHROID) 100 MCG tablet Take 100 mcg by mouth daily before breakfast.   Yes Historical Provider, MD  loratadine (CLARITIN) 10 MG tablet Take 10 mg by mouth daily.   Yes Historical Provider, MD  omeprazole (PRILOSEC) 20 MG capsule Take 20 mg by mouth daily.   Yes Historical Provider, MD  potassium chloride (K-DUR) 10 MEQ tablet Take 30 mEq by mouth daily.   Yes Historical Provider, MD    vitamin B-12 (CYANOCOBALAMIN) 1000 MCG tablet Take 1,000 mcg by mouth daily.     Yes Historical Provider, MD  Vitamin D, Ergocalciferol, (DRISDOL) 50000 UNITS CAPS capsule Take 50,000 Units by mouth every 7 (seven) days. Sunday   Yes Historical Provider, MD    Scheduled Meds: . diltiazem  180 mg Oral Daily  . docusate sodium  100 mg Oral Daily  . escitalopram  10 mg Oral Daily  . insulin aspart  0-15 Units Subcutaneous Q4H  . isosorbide-hydrALAZINE  1 tablet Oral BID  . levothyroxine  100 mcg Oral QAC breakfast  . pantoprazole  40 mg Oral Daily  . polyethylene glycol  17 g Oral Daily   Infusions: . sodium chloride 75 mL/hr at 12/29/12 2228  . sodium chloride 75 mL/hr at 12/30/12 2034  . heparin 1,200 Units/hr (01/02/13 1406)   PRN Meds: acetaminophen, acetaminophen, bisacodyl, guaiFENesin-dextromethorphan, morphine injection, ondansetron, oxyCODONE, phenol   Allergies as of 12/29/2012  . (No Known Allergies)    Family History  Problem Relation Age of Onset  . Heart disease Maternal Grandfather   . Diabetes Sister   . Tuberculosis Mother     father  . Diabetes Brother   . Stroke Brother     History   Social History  . Marital Status: Widowed    Spouse Name: N/A    Number of Children: 2  . Years of Education: N/A   Occupational History  . Retired    Social History Main Topics  . Smoking status: Never Smoker   . Smokeless tobacco: Not on file  . Alcohol Use: No  . Drug Use: No  . Sexual Activity: Not on file   Other Topics Concern  . Not on file   Social History Narrative   Participates in a senior games.      REVIEW OF SYSTEMS: Constitutional:  Weak generally, ambulates with roling walker.   ENT:  No nose bleeds,  Chronic dry mouth, dentures fit well Pulm:  No new cough or dyspnea.  Clear, frothy sputum with cough.  CV:  No palpitations or chest pain GU:  No dysuria.  + oliguria, no hematuria GI:  Per HPI Heme:  No hx of anemia or bone marrow  problems.  No large hematomas.    Transfusions:  None per pt recall Neuro:  No headache, no tingling or numbness. Derm:   No rash , no itching, no sores Endocrine:  No excessive thirst, no sweats.  No cold or heat  intolerance.  Immunization:  Flu shot up to date Travel:  none   PHYSICAL EXAM: Vital signs in last 24 hours: Filed Vitals:   01/02/13 0539  BP:   Pulse: 59  Temp: 97.3 F (36.3 C)  Resp: 18   Wt Readings from Last 3 Encounters:  01/02/13 71.1 kg (156 lb 12 oz)  01/02/13 71.1 kg (156 lb 12 oz)  01/14/12 78.019 kg (172 lb)   General: frail, aged WF who is somewhat uncomfortable.  Head:  No asymmetry or facial swelling  Eyes:  No icterus.  + conj pallor  Ears:  Slightly HOH  Nose:  No discharge or congestion Mouth:  Dry MM.  Very few incisors remain.  No lesions Neck:  No mass or JVD Lungs:  Fine crackles in bases.  No cough, no dyspnea Heart: RRR.  No MRG.  Paced rhythm on monitor.  Pacer in place in upper left chest Abdomen:  Soft, not distended, no mass, no bruits.  Mild to moderate tenderness in Left abdomen.  No guard or rebound Rectal: not done   Musc/Skeltl: no joint swelling Extremities:  Incision scar is CDI in medial left lower leg  Neurologic:  Oriented x 3, no tremor, no limb weakness.  Skin:  No rash, no telangectasia.  Tattoos:  none Nodes:  No cervical or inguinal adenopathy.    Psych:  Pleasant, not anxious.   Intake/Output from previous day: 11/23 0701 - 11/24 0700 In: 358 [P.O.:358] Out: 545 [Urine:545] Intake/Output this shift: Total I/O In: 360 [P.O.:360] Out: 50 [Urine:50]  LAB RESULTS:  Recent Labs  12/31/12 0418 01/01/13 0905 01/02/13 0150  WBC 20.9* 18.2* 15.7*  HGB 11.2* 9.7* 8.8*  HCT 33.0* 29.2* 26.3*  PLT 134* 127* 135*   BMET Lab Results  Component Value Date   NA 137 01/02/2013   NA 138 01/01/2013   NA 136 12/31/2012   K 3.7 01/02/2013   K 3.6 01/01/2013   K 3.7 12/31/2012   CL 108 01/02/2013   CL 110  01/01/2013   CL 102 12/31/2012   CO2 21 01/02/2013   CO2 19 01/01/2013   CO2 19 12/31/2012   GLUCOSE 123* 01/02/2013   GLUCOSE 110* 01/01/2013   GLUCOSE 220* 12/31/2012   BUN 18 01/02/2013   BUN 19 01/01/2013   BUN 20 12/31/2012   CREATININE 0.94 01/02/2013   CREATININE 0.97 01/01/2013   CREATININE 1.03 12/31/2012   CALCIUM 9.3 01/02/2013   CALCIUM 9.8 01/01/2013   CALCIUM 10.3 12/31/2012   LFT  Recent Labs  12/31/12 0418  PROT 5.4*  ALBUMIN 2.2*  AST 74*  ALT 50*  ALKPHOS 80  BILITOT 0.6   PT/INR Lab Results  Component Value Date   INR 1.38 12/29/2012   INR 2.1 03/11/2009   INR 1.06 12/28/2008    RADIOLOGY STUDIES: 10/16/2010 ESOPHOGRAM/BARIUM SWALLOW  Technique: Combined double contrast and single contrast  examination performed using effervescent crystals, thick barium  liquid, and thin barium liquid. IMPRESSION:  1. Generalized decreased esophageal motility. This might explain  the somewhat slow transit of ingested material through the distal  third of the esophagus. Only occasional tertiary contractions are  noted.  2. Flash penetration of contrast along the undersurface of the  epiglottis with swallowing. No aspiration was observed. The  patient may benefit from a speech therapy evaluation/modified  barium swallow.  3. No focal esophageal lesion, stricture, or other significant  Abnormality.   ENDOSCOPIC STUDIES: 2006  EGD in [place] Normal   IMPRESSION:   *  Chronic dysphagia to solids mostly.  This is likely dysmotility and not a stricture, had little benefit from latest dilation several months ago.   *  11/21 left LE embolectomy.  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  Occurred despite Pradaxa therapy.   *  Atrial fibrillation. Chronic Pradaxa PTA.   Recommendation is to switch to Xarelto, but currently treating with IV Heparin.   *  Abnormal transaminases, resolving.      PLAN:     *  Pursue esophagram    *  Dr  Russella Dar to see pt.   Jennye Moccasin  01/02/2013, 2:31 PM Pager: [phone]     Attending physician's note   I have taken a history, examined the patient and reviewed the chart. I agree with the Advanced Practitioner's note, impression and recommendations. I suspect her chronic dysphagia is related to an esophageal motility disorder. She did not respond to an esophageal dilation a few months ago by Dr. Chales Abrahams.  Meryl Dare, MD [facility]

## 2013-01-02 NOTE — Progress Notes (Signed)
ANTICOAGULATION CONSULT NOTE - Follow Up Consult   HL = 0.45 (goal 0.3 - 0.7 units/mL) Heparin dosing weight = 60 kg   Assessment: 89 YOF on IV heparin for Afib, renal thrombus and LLE DVT.  Confirmatory heparin level is therapeutic; no bleeding reported.   Plan: - Continue heparin gtt at 1200 units/hr - F/U AM labs    Tanasia Budzinski D. Laney Potash, PharmD, BCPS Pager:  [phone] 01/02/2013, 7:55 PM

## 2013-01-03 ENCOUNTER — Encounter (HOSPITAL_COMMUNITY): Payer: Self-pay | Admitting: Surgery

## 2013-01-03 ENCOUNTER — Inpatient Hospital Stay (HOSPITAL_COMMUNITY): Payer: Medicare Other

## 2013-01-03 LAB — CBC
HCT: 27.5 % — ABNORMAL LOW (ref 36.0–46.0)
MCH: 31.5 pg (ref 26.0–34.0)
MCHC: 33.1 g/dL (ref 30.0–36.0)
MCV: 95.2 fL (ref 78.0–100.0)
Platelets: 148 10*3/uL — ABNORMAL LOW (ref 150–400)
RBC: 2.89 MIL/uL — ABNORMAL LOW (ref 3.87–5.11)
WBC: 11.8 10*3/uL — ABNORMAL HIGH (ref 4.0–10.5)

## 2013-01-03 LAB — BASIC METABOLIC PANEL
BUN: 15 mg/dL (ref 6–23)
CO2: 18 mEq/L — ABNORMAL LOW (ref 19–32)
Chloride: 106 mEq/L (ref 96–112)
Creatinine, Ser: 0.94 mg/dL (ref 0.50–1.10)
Glucose, Bld: 89 mg/dL (ref 70–99)
Potassium: 3.5 mEq/L (ref 3.5–5.1)

## 2013-01-03 LAB — GLUCOSE, CAPILLARY
Glucose-Capillary: 116 mg/dL — ABNORMAL HIGH (ref 70–99)
Glucose-Capillary: 117 mg/dL — ABNORMAL HIGH (ref 70–99)
Glucose-Capillary: 91 mg/dL (ref 70–99)
Glucose-Capillary: 94 mg/dL (ref 70–99)

## 2013-01-03 MED ORDER — POLYETHYLENE GLYCOL 3350 17 G PO PACK: 17.0000 g | PACK | Freq: Every day | ORAL | Status: DC

## 2013-01-03 MED ORDER — RIVAROXABAN 15 MG PO TABS: 15.0000 mg | ORAL_TABLET | Freq: Two times a day (BID) | ORAL | Status: DC

## 2013-01-03 MED ORDER — DSS 100 MG PO CAPS: 100.0000 mg | ORAL_CAPSULE | Freq: Every day | ORAL | Status: DC

## 2013-01-03 MED ORDER — RIVAROXABAN 15 MG PO TABS
15.0000 mg | ORAL_TABLET | Freq: Every day | ORAL | Status: DC
  Filled 2013-01-03: qty 1

## 2013-01-03 MED ORDER — FUROSEMIDE 40 MG PO TABS: 40.0000 mg | ORAL_TABLET | Freq: Every day | ORAL | Status: DC

## 2013-01-03 MED ORDER — RIVAROXABAN 20 MG PO TABS: 20.0000 mg | ORAL_TABLET | Freq: Every day | ORAL | Status: DC

## 2013-01-03 MED ORDER — RIVAROXABAN 15 MG PO TABS
15.0000 mg | ORAL_TABLET | Freq: Two times a day (BID) | ORAL | Status: DC
  Administered 2013-01-04: 15 mg via ORAL
  Filled 2013-01-03 (×4): qty 1

## 2013-01-03 MED ORDER — PANTOPRAZOLE SODIUM 40 MG PO TBEC: 40.0000 mg | DELAYED_RELEASE_TABLET | Freq: Two times a day (BID) | ORAL | Status: DC

## 2013-01-03 MED ORDER — ISOSORB DINITRATE-HYDRALAZINE 20-37.5 MG PO TABS: 1.0000 | ORAL_TABLET | Freq: Two times a day (BID) | ORAL | Status: DC

## 2013-01-03 NOTE — Progress Notes (Signed)
Progress Note   Subjective  No new complaints.   Objective  Vital signs in last 24 hours: Temp:  [97 F (36.1 C)-98.4 F (36.9 C)] 97.7 F (36.5 C) (11/25 0535) Pulse Rate:  [59-62] 59 (11/25 0535) Resp:  [17-18] 17 (11/25 0535) BP: (117-121)/(50-60) 117/50 mmHg (11/25 0535) SpO2:  [95 %-97 %] 95 % (11/25 0535) Weight:  [156 lb 12 oz (71.1 kg)] 156 lb 12 oz (71.1 kg) (11/25 0343) Last BM Date: 12/30/12 General:   Alert, well-developed, elderly white female in NAD Heart:  Regular rate and rhythm; no murmurs Abdomen:  Soft, nontender and nondistended. Normal bowel sounds, without guarding, and without rebound.   Extremities:  Without edema. Neurologic:  Alert and  oriented x4;  grossly normal neurologically. Psych:  Alert and cooperative. Normal mood and affect.  Intake/Output from previous day: 11/24 0701 - 11/25 0700 In: 600 [P.O.:600] Out: 250 [Urine:250] Intake/Output this shift: Total I/O In: 0  Out: 300 [Urine:300]  Lab Results:  Recent Labs  01/01/13 0905 01/02/13 0150 01/03/13 0542  WBC 18.2* 15.7* 11.8*  HGB 9.7* 8.8* 9.1*  HCT 29.2* 26.3* 27.5*  PLT 127* 135* 148*   BMET  Recent Labs  01/01/13 0905 01/02/13 0150 01/03/13 0542  NA 138 137 138  K 3.6 3.7 3.5  CL 110 108 106  CO2 19 21 18*  GLUCOSE 110* 123* 89  BUN 19 18 15   CREATININE 0.97 0.94 0.94  CALCIUM 9.8 9.3 9.7    Studies/Results: Dg Esophagus  01/03/2013   CLINICAL DATA:  Dysphagia.  Evaluate for stricture.  EXAM: ESOPHOGRAM / BARIUM SWALLOW / BARIUM TABLET STUDY  TECHNIQUE: Combined double contrast and single contrast examination performed using effervescent crystals, thick barium liquid, and thin barium liquid. The patient was observed with fluoroscopy swallowing a 13mm barium sulphate tablet.  COMPARISON:  Esophagram 10/16/2010.  Abdominal CT 12/29/2012.  FLUOROSCOPY TIME:  38 seconds.  FINDINGS: Study was performed in the supine and semi erect positions due to the patient's  limited mobility. There is limited evaluation of the cervical esophagus. Moderate presbyesophagus is demonstrated with diffuse tertiary contractions. No focal stricture or mucosal irregularity is identified, although there is smooth narrowing of the distal esophagus. A small epiphrenic diverticulum is noted.  No reflux was elicited with the water siphon test. A 13 mm barium tablet was administered and was retained in the distal esophagus over approximately 3 mm of intermittent fluoroscopic observation.  IMPRESSION: 1. Presbyesophagus with diffusely increased tertiary contractions. 2. Smooth narrowing of the distal esophagus and retention of the barium tablet may indicate the presence of a smooth esophageal stricture. No focal stricture or mucosal irregularity identified. 3. Small epiphrenic diverticulum.   Electronically Signed   By: Roxy Horseman M.D.   On: 01/03/2013 11:11      Assessment & Plan   * Chronic dysphagia to solids mostly. Little benefit from latest EGD/dilation several months ago. BA esophagram today shows presbyesophagus with diffuse tertiary contractions and tablet retention without a stricture. She has an esophageal motility disorder. Modify diet to swallowing problems. No need for EGD or further GI work up at this time. OP follow up with her Gastroenterologist in [place], Dr. Chales Abrahams, as needed. GI signing off.    * 11/21 left LE embolectomy.   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 Occurred despite Pradaxa therapy.   * Atrial fibrillation. Chronic Pradaxa PTA. Recommendation is to switch to Xarelto, but currently treating with IV Heparin.   *  Abnormal transaminases, resolving.    LOS: 5 days   Caycee Wanat T. Russella Dar  MD [facility] 01/03/2013, 1:16 PM

## 2013-01-03 NOTE — Discharge Summary (Signed)
Physician Discharge Summary  [name] GNF:[id number] DOB: [birth year] DOA: 12/29/2012  PCP: Renae Fickle, MD  Admit date: 12/29/2012 Discharge date: 01/04/2013  Time spent: >30 minutes  Recommendations for Outpatient Follow-up:  BMET and CBC in 1 week to follow electrolytes, renal function and Hgb trend  Discharge Diagnoses:  Active Problems:   Renal artery thrombosis   Embolism and thrombosis of celiac artery   Renal infarction Atrial fibrillation HTN Chronic diastolic heart failure Depression Esophageal dysmotility and dysphagia GERD Hypothyroidism Urinary retention and constipation  Discharge Condition: stable and improved. Will discharge to SNF for physical rehabilitation.  Diet recommendation: heart healthy diet.  Filed Weights   01/01/13 1943 01/02/13 0539 01/03/13 0343  Weight: 69.8 kg (153 lb 14.1 oz) 71.1 kg (156 lb 12 oz) 71.1 kg (156 lb 12 oz)    History of present illness:  79 yo with past medical history of HTN brought to [facility] ED with LLQ abdominal pain. In ED CT reveled renal / celiac artery thrombi and infarcted left kidney. Vascular surgery was consulted and she was transferred to [place] for further management. Also found with new large LLE DVT. Patient is status post left popliteal embolectomy 11/21 and is on heparin drip with plans to transition to xarelto at discharge and to go to SNF for rehabilitation.   Hospital Course:  1-Renal/celiac artery stenosis with renal infarct: vascular surgery on board. Currently recommending heparin drip.  -renal function stable overall, but patient with transient decrease in urine output. Improved at discharge. -follow renal function trend  -will avoid cozaar   2- LLE thrombosis: per vascular surgery  -s/p embolectomy on 11/21  -will follow with vascular surgery at discharge -but with hx of AF and failure to pradaxa will recommend xarelto at discharge   3-Atrial fibrillation: rate controlled.  -continue  diltiazem  -will discharge on xarelto  4-GERD: continue PPI   5-Hypothyroidism: continue synthroid   6-HTN: stable overall.  -continue cardizem and bidil   7-urinary retention: foley in place, IVF's increased.  -improved in urine output -voiding trial done  8-diastolic heart failure: chronic and compensated. Preserved EF from last 2-D echo on July 2011(60-65%)  -no SOB, no JVD   9-constipation: will continue miralax and colace   10-physical deconditioning: per PT will need SNF at discharge. SW order in place.   11-dysphagia: appears to be due to dysmotility, but has hx of bad reflux and stricture in the past. Will continue PPI  -no EGD needed as per GI rec's -patient diet advance to regular diet/thin liquids -avoid cold beverages    Procedures:  LLE endarterectomy (11/21)  See below for x-ray reports   Consultations: CCM  Vascular surgery  GI   Discharge Exam: Filed Vitals:   01/03/13 1330  BP: 132/54  Pulse: 59  Temp: 98.4 F (36.9 C)  Resp: 18   General: Afebrile, no acute distress or complaints  Cardiovascular: S1 and S2, no rubs or gallops, positive murmur  Respiratory: CTA bilaterally  Abdomen: soft, NT, ND, positive BS  Musculoskeletal: mild discomfort on palpation of LLE, trace edema, no cyanosis  Discharge Instructions  Discharge Orders   Future Orders Complete By Expires   Diet - low sodium heart healthy  As directed    Discharge instructions  As directed    Comments:     Follow up with vascular surgery as instructed Take medications as prescribed Patient will need to use warm water before taking pills and initiating food; avoid cold beverages  BMET and  CBC in 1 week Maintain patient well hydrated       Medication List    STOP taking these medications       dabigatran 150 MG Caps capsule  Commonly known as:  PRADAXA     omeprazole 20 MG capsule  Commonly known as:  PRILOSEC      TAKE these medications       calcium carbonate  1250 MG tablet  Commonly known as:  OS-CAL - dosed in mg of elemental calcium  Take 1 tablet by mouth daily with breakfast.     diltiazem 180 MG 24 hr capsule  Commonly known as:  DILACOR XR  Take 180 mg by mouth daily.     DSS 100 MG Caps  Take 100 mg by mouth daily.     escitalopram 10 MG tablet  Commonly known as:  LEXAPRO  Take 10 mg by mouth daily.     furosemide 40 MG tablet  Commonly known as:  LASIX  Take 1 tablet (40 mg total) by mouth daily.     isosorbide-hydrALAZINE 20-37.5 MG per tablet  Commonly known as:  BIDIL  Take 1 tablet by mouth 2 (two) times daily.     levothyroxine 100 MCG tablet  Commonly known as:  SYNTHROID, LEVOTHROID  Take 100 mcg by mouth daily before breakfast.     loratadine 10 MG tablet  Commonly known as:  CLARITIN  Take 10 mg by mouth daily.     pantoprazole 40 MG tablet  Commonly known as:  PROTONIX  Take 1 tablet (40 mg total) by mouth 2 (two) times daily.     polyethylene glycol packet  Commonly known as:  MIRALAX / GLYCOLAX  Take 17 g by mouth daily.     potassium chloride 10 MEQ tablet  Commonly known as:  K-DUR  Take 30 mEq by mouth daily.     Rivaroxaban 15 MG Tabs tablet  Commonly known as:  XARELTO  Take 1 tablet (15 mg total) by mouth 2 (two) times daily with a meal.     Rivaroxaban 20 MG Tabs tablet  Commonly known as:  XARELTO  Take 1 tablet (20 mg total) by mouth daily with supper.  Start taking on:  01/25/2013     vitamin B-12 1000 MCG tablet  Commonly known as:  CYANOCOBALAMIN  Take 1,000 mcg by mouth daily.     Vitamin D (Ergocalciferol) 50000 UNITS Caps capsule  Commonly known as:  DRISDOL  Take 50,000 Units by mouth every 7 (seven) days. Sunday       No Known Allergies     Follow-up Information   Follow up with Renae Fickle, MD. Schedule an appointment as soon as possible for a visit in 1 week.   Specialty:  Unknown Physician Specialty   Contact information:   [address] [place]  [address] [phone]       Follow up with DICKSON,CHRISTOPHER S, MD. (call office to confirmed appointment details)    Specialty:  Vascular Surgery   Contact information:   [address] [place] [address] [phone]       The results of significant diagnostics from this hospitalization (including imaging, microbiology, ancillary and laboratory) are listed below for reference.    Significant Diagnostic Studies: Dg Esophagus  01/03/2013   CLINICAL DATA:  Dysphagia.  Evaluate for stricture.  EXAM: ESOPHOGRAM / BARIUM SWALLOW / BARIUM TABLET STUDY  TECHNIQUE: Combined double contrast and single contrast examination performed using effervescent crystals, thick  barium liquid, and thin barium liquid. The patient was observed with fluoroscopy swallowing a 13mm barium sulphate tablet.  COMPARISON:  Esophagram 10/16/2010.  Abdominal CT 12/29/2012.  FLUOROSCOPY TIME:  38 seconds.  FINDINGS: Study was performed in the supine and semi erect positions due to the patient's limited mobility. There is limited evaluation of the cervical esophagus. Moderate presbyesophagus is demonstrated with diffuse tertiary contractions. No focal stricture or mucosal irregularity is identified, although there is smooth narrowing of the distal esophagus. A small epiphrenic diverticulum is noted.  No reflux was elicited with the water siphon test. A 13 mm barium tablet was administered and was retained in the distal esophagus over approximately 3 mm of intermittent fluoroscopic observation.  IMPRESSION: 1. Presbyesophagus with diffusely increased tertiary contractions. 2. Smooth narrowing of the distal esophagus and retention of the barium tablet may indicate the presence of a smooth esophageal stricture. No focal stricture or mucosal irregularity identified. 3. Small epiphrenic diverticulum.   Electronically Signed   By: Roxy Horseman M.D.   On: 01/03/2013 11:11    Microbiology: Recent Results (from the past 240 hour(s))   MRSA PCR SCREENING     Status: None   Collection Time    12/29/12  8:57 PM      Result Value Range Status   MRSA by PCR NEGATIVE  NEGATIVE Final   Comment:            The GeneXpert MRSA Assay (FDA     approved for NASAL specimens     only), is one component of a     comprehensive MRSA colonization     surveillance program. It is not     intended to diagnose MRSA     infection nor to guide or     monitor treatment for     MRSA infections.     Labs: Basic Metabolic Panel:  Recent Labs Lab 12/29/12 2200  12/31/12 0418 12/31/12 1440 01/01/13 0905 01/02/13 0150 01/03/13 0542  NA 138  < > 141 136 138 137 138  K 3.6  < > 3.3* 3.7 3.6 3.7 3.5  CL 99  < > 107 102 110 108 106  CO2 27  < > 24 19 19 21  18*  GLUCOSE 241*  < > 117* 220* 110* 123* 89  BUN 17  < > 17 20 19 18 15   CREATININE 1.06  < > 1.01 1.03 0.97 0.94 0.94  CALCIUM 10.6*  < > 9.9 10.3 9.8 9.3 9.7  MG 1.9  --   --   --   --   --   --   PHOS 3.1  --   --   --   --   --   --   < > = values in this interval not displayed. Liver Function Tests:  Recent Labs Lab 12/29/12 2200 12/30/12 0843 12/31/12 0418  AST 196* 155* 74*  ALT 73* 70* 50*  ALKPHOS 83 80 80  BILITOT 0.8 1.0 0.6  PROT 6.5 6.4 5.4*  ALBUMIN 3.2* 3.0* 2.2*   CBC:  Recent Labs Lab 12/29/12 2200  12/31/12 0115 12/31/12 0418 01/01/13 0905 01/02/13 0150 01/03/13 0542  WBC 13.5*  < > 22.2* 20.9* 18.2* 15.7* 11.8*  NEUTROABS 11.1*  --   --   --   --   --   --   HGB 13.4  < > 11.1* 11.2* 9.7* 8.8* 9.1*  HCT 38.7  < > 32.3* 33.0* 29.2* 26.3* 27.5*  MCV 91.9  < >  93.6 93.5 94.2 94.6 95.2  PLT 165  < > 132* 134* 127* 135* 148*  < > = values in this interval not displayed.  CBG:  Recent Labs Lab 01/03/13 0001 01/03/13 0404 01/03/13 0727 01/03/13 1152 01/03/13 1536  GLUCAP 117* 116* 94 91 102*    Signed:  Yitzchok Carriger  [facility] 01/03/2013, 6:06 PM

## 2013-01-03 NOTE — Progress Notes (Signed)
ANTICOAGULATION CONSULT NOTE - Follow Up Consult  Pharmacy Consult for Heparin Indication: Afib, enal/celiac thrombi. New large LLE DVT.  No Known Allergies  Patient Measurements: Height: 5\' 4"  (162.6 cm) Weight: 156 lb 12 oz (71.1 kg) IBW/kg (Calculated) : 54.7 Heparin Dosing Weight: 60 kg  Vital Signs: Temp: 97.7 F (36.5 C) (11/25 0535) Temp src: Oral (11/25 0535) BP: 117/50 mmHg (11/25 0535) Pulse Rate: 59 (11/25 0535)  Labs:  Recent Labs  01/01/13 0905 01/02/13 0150 01/02/13 1141 01/02/13 1900 01/03/13 0542  HGB 9.7* 8.8*  --   --  9.1*  HCT 29.2* 26.3*  --   --  27.5*  PLT 127* 135*  --   --  148*  APTT 58* 81*  --   --  90*  HEPARINUNFRC 0.11* 0.14* 0.36 0.45 0.44  CREATININE 0.97 0.94  --   --  0.94    Estimated Creatinine Clearance: 39.3 ml/min (by C-G formula based on Cr of 0.94).   Assessment: 79 yo F brought to [facility] ED with LLQ abdominal pain. 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 She was then transferred to [facility] for further management. Patient was on pradaxa 150 mg twice daily PTA for Afib. Per patient's relative, last dose of pradaxa was taken around 1900 on 11/19.   PMH: HTN, Afib, Sick sinus syndrome, Memory loss, diastolic dysfunction, GERD, hypothyroidism, HLD  Anticoagulation: (PTA dabigatran, 11/20 BL aPTT 116), Hep gtt for renal/celiac thrombi. New large LLE DVT. S/p left popliteal embolectomy 11/21. Heparin level 0.44. Hgb 9.1. Plts 148 ok.  Infectious Disease: afeb, WBC 11.8 slowly decreasing . LA up to 3.1 on 11/23.  Cardiovascular: HTN, Afib, SSS, HLD. VSS. Meds: Diltiazem, Bidil  Endocrinology: CBG 94-176, on SSI, levothyroxine.   Gastrointestinal / Nutrition: po PPI, docusate, Miralax. Trouble swallowing. Dysmotility vs stricture (had little benefit from latest dilation several months ago.)  Neurology: lexapro resumed  Nephrology: renal thrombus, Scr 0.94  Pulmonary: RA 95%  Hematology /  Oncology: Hgb 9.1  Hepatobili: 11/22 elevated AST/ALT, INR 1.38  PTA Medication Issues: oscal, dabigatran, lasix, claritin, potassium, vit B-12, vit D  Best Practices: hep gtt, PPI   Heparin level 0.3-0.7 units/ml Monitor platelets by anticoagulation protocol: Yes   Plan:  Continue IV heparin at 1200 units/hr  Merilynn Finland, [employer] 01/03/2013,11:01 AM

## 2013-01-03 NOTE — Progress Notes (Signed)
Pharmacy Consult -- Xarelto  New DVT s/p embolectomy Also with Afib -- on Pradaxa PTA  CrCl=39 ml/ min  Plan: 1) Treating with DVT dosing -- 15 mg po BID x 21 days, then 20 mg po daily 2) Watch renal function and monitor for bleeding.  Thank you. Okey Regal, PharmD [phone]

## 2013-01-03 NOTE — Progress Notes (Signed)
The patient did not have any complaints overnight.  She is a max assistx2 to stand up and was weighed in the bed this morning.  She put out 200 mLs of urine overnight.

## 2013-01-03 NOTE — Progress Notes (Signed)
Speech Language Pathology Treatment: Dysphagia  Patient Details Name: [name] MRN: [id number] DOB: 12/26/1921 Today's Date: 01/03/2013 Time: 1155- 1221 (26 min)     Assessment / Plan / Recommendation Clinical Impression  F/u for dysphagia: Pt is being followed by GI.  Had esophagram today - IMPRESSION: Presbyesophagus with diffusely increased tertiary contractions. 2. Smooth narrowing of the distal esophagus and retention of the barium tablet may indicate the presence of a smooth esophageal stricture. No focal stricture or mucosal irregularity identified. 3. Small epiphrenic diverticulum.   At bedside following exam, clinical manifestation of dysmotility is evident.  Minimal intake (two tspns of applesauce, two boluses of Ensure liquid) lead to repetitive, dry swallows and constant eructation.  Pt unable to recall strategies to facilitate transfer through esophagus; it's difficulty to know how beneficial such strategies will be.  Recommend advancing diet to regular/thin liquids to liberalize food choices; rec continued use of strategies in effort to help with successful eating; recommend consuming most nutrient-dense foods/liquids first.  SLP will sign-off at this time; GI continues to follow.         Pertinent Vitals   SLP Plan  Discharge SLP treatment   Recommendations Diet recommendations: Regular;Thin liquid Liquids provided via: Straw Medication Administration: Crushed with puree Supervision: Patient able to self feed;Intermittent supervision to cue for compensatory strategies Compensations: Slow rate;Small sips/bites;Follow solids with liquid Postural Changes and/or Swallow Maneuvers: Upright 30-60 min after meal;Seated upright 90 degrees                Killian Ress L. Samson Frederic, [place] CCC/SLP Pager [phone]      Blenda Mounts Laurice 01/03/2013, 12:12 PM

## 2013-01-03 NOTE — Progress Notes (Addendum)
[facility]  Progress Note Bypass Surgery  Date of Surgery: 12/29/2012 - 12/30/2012  Procedure(s): EMBOLECTOMY LEFT LEG, with patch angioplasty Surgeon: Surgeon(s): Nada Libman, MD  4 Days Post-Op  History of Present Illness  [name] is a 79 y.o. female who is  4 Days Post-Op. The patient's pre-op symptoms of pain in the left foot are Improved . Numbness in the left foot still present but has good motion. Patients pain is well controlled.  Denies calf pain  VASC. LAB Studies:        ABI: Right 1.44;  Left 1.21;   Physical Examination  BP Readings from Last 3 Encounters:  01/03/13 117/50  01/03/13 117/50  01/14/12 152/70   Temp Readings from Last 3 Encounters:  01/03/13 97.7 F (36.5 C) Oral  01/03/13 97.7 F (36.5 C) Oral   SpO2 Readings from Last 3 Encounters:  01/03/13 95%  01/03/13 95%   Pulse Readings from Last 3 Encounters:  01/03/13 59  01/03/13 59  01/14/12 65    Pt is A&O x 3 left lower extremity: Incision/s is/are clean,dry.intact, and  healing without hematoma, erythema or drainage Limb is warm; with good color Left calf soft, NT Good motion left leg/foot decreased sensation in left foot - normal in calf  Right Dorsalis Pedis pulse is palpable  Left Dorsalis Pedis pulse is palpable Left Posterior tibial pulse is  palpable   Assessment/Plan: Pt. Doing well No signs of compartment syndrome Post-op pain is controlled Wounds are healing well PT/OT for ambulation Residual numbness in left foot - may take time to resolve as leg was ischemic for over 3 weeks.   [name],[name]  01/03/2013 9:18 AM      Agree with the above    Durene Cal

## 2013-01-03 NOTE — Clinical Social Work Placement (Addendum)
    Clinical Social Work Department CLINICAL SOCIAL WORK PLACEMENT NOTE 01/07/2013  Patient:  [name], [name]  Account Number:  [id number] Admit date:  12/29/2012  Clinical Social Worker:  Lupita Leash Damontre Millea, LCSWA  Date/time:  01/02/2013 01:45 PM  Clinical Social Work is seeking post-discharge placement for this patient at the following level of care:   SKILLED NURSING   (*CSW will update this form in Epic as items are completed)   01/02/2013  Patient/family provided with [facility] Department of Clinical Social Work's list of facilities offering this level of care within the geographic area requested by the patient (or if unable, by the patient's family).  01/02/2013  Patient/family informed of their freedom to choose among providers that offer the needed level of care, that participate in Medicare, Medicaid or managed care program needed by the patient, have an available bed and are willing to accept the patient.  01/02/2013  Patient/family informed of [facility]' ownership interest in [facility], as well as of the fact that they are under no obligation to receive care at this facility.  PASARR submitted to EDS on 01/02/2013 PASARR number received from EDS on 01/02/2013  FL2 transmitted to all facilities in geographic area requested by pt/family on   FL2 transmitted to all facilities within larger geographic area on   Patient informed that his/her managed care company has contracts with or will negotiate with  certain facilities, including the following:   [facility]- Medicare Complete     Patient/family informed of bed offers received:  01/03/2013 Patient chooses bed at OTHER Physician recommends and patient chooses bed at    Patient to be transferred to OTHER on  01/04/2013 Patient to be transferred to facility by Ambulance Sharin Mons)  The following physician request were entered in Epic:   Additional Comments: 01/03/13  Bed offer received from [facility]  [place]. This is facility of choice per patient's son.  CSW let message for patient's son [name] today and telephone call to him this evening. Awaiting his call back regarding bed offer. Plan d/c to SNF 01/04/13 per MD.  Lupita Leash T. [place] [facility]  [phone] 01/04/13 OK for d/c to SNF today. Patient and son are pleased with d/c plan; nursing notified as was facility. CSW signing off.  Lorri Frederick. Ajah Vanhoose, LCSWA [phone]

## 2013-01-03 NOTE — Progress Notes (Signed)
Bed offers received for patient; attempting to reach patient's son Windy Fast as patient defers to him for decision making. Family had wanted [facility] and bed offer is in place.  Patient discussed with Dr. Gwenlyn Perking. Plan d/c to SNF tomorrow.  Lorri Frederick. [place] [facility]  [phone]

## 2013-01-04 LAB — GLUCOSE, CAPILLARY
Glucose-Capillary: 95 mg/dL (ref 70–99)
Glucose-Capillary: 89 mg/dL (ref 70–99)

## 2013-01-04 LAB — BASIC METABOLIC PANEL
BUN: 12 mg/dL (ref 6–23)
CO2: 19 mEq/L (ref 19–32)
Creatinine, Ser: 0.95 mg/dL (ref 0.50–1.10)
GFR calc Af Amer: 60 mL/min — ABNORMAL LOW (ref >90)
GFR calc non Af Amer: 52 mL/min — ABNORMAL LOW (ref >90)
Glucose, Bld: 85 mg/dL (ref 70–99)
Potassium: 3.5 mEq/L (ref 3.5–5.1)

## 2013-01-04 NOTE — Progress Notes (Signed)
I agree with the above  Wells Brabham 

## 2013-01-04 NOTE — Progress Notes (Signed)
Physical Therapy Treatment Patient Details Name: [name] MRN: [id number] DOB: 03/18/1921 Today's Date: 01/04/2013 Time: 2130-8657 PT Time Calculation (min): 11 min  PT Assessment / Plan / Recommendation  History of Present Illness The patient is an 79 yo female admitted with approximately one week of abdominal pain.  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  She is on anticoagulation for atrial fibrillation.  During vascular surgery evaluation, she reported approximately a three-week history of cramping in her left leg as well as having trouble walking.  Patient with nonpalpable dorsalis pedis pulse on the left. Doppler ultrasound showed occlusion of the distal superficial femoral artery with reconstitution of her tibial vessels. Patient s/p embolectomies LLE.   PT Comments   Patient preparing for discharge.  Completed exercises in supine.  Required assist to perform correctly and to complete.  Follow Up Recommendations  SNF     Does the patient have the potential to tolerate intense rehabilitation     Barriers to Discharge        Equipment Recommendations  None recommended by PT    Recommendations for Other Services    Frequency Min 3X/week   Progress towards PT Goals Progress towards PT goals: Progressing toward goals  Plan Current plan remains appropriate    Precautions / Restrictions Precautions Precautions: Fall Restrictions Weight Bearing Restrictions: No   Pertinent Vitals/Pain     Mobility  Bed Mobility Bed Mobility: Not assessed    Exercises General Exercises - Upper Extremity Shoulder Flexion: AROM;Both;10 reps;Supine Elbow Flexion: AROM;Both;10 reps;Supine General Exercises - Lower Extremity Ankle Circles/Pumps: AROM;Both;10 reps;Supine Short Arc Quad: AAROM;Both;10 reps;Supine Heel Slides: AAROM;Both;10 reps;Supine Hip ABduction/ADduction: AAROM;Both;10 reps;Supine     PT Goals (current  goals can now be found in the care plan section)    Visit Information  Last PT Received On: 01/04/13 Assistance Needed: +2 History of Present Illness: The patient is an 79 yo female admitted with approximately one week of abdominal pain.  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  She is on anticoagulation for atrial fibrillation.  During vascular surgery evaluation, she reported approximately a three-week history of cramping in her left leg as well as having trouble walking.  Patient with nonpalpable dorsalis pedis pulse on the left. Doppler ultrasound showed occlusion of the distal superficial femoral artery with reconstitution of her tibial vessels. Patient s/p embolectomies LLE.    Subjective Data  Subjective: Am I going home today?   Cognition  Cognition Arousal/Alertness: Awake/alert Behavior During Therapy: WFL for tasks assessed/performed Overall Cognitive Status: Impaired/Different from baseline Area of Impairment: Orientation;Memory;Awareness;Problem solving Orientation Level: Disoriented to;Place;Time;Situation Memory: Decreased short-term memory Problem Solving: Slow processing;Requires verbal cues    Balance     End of Session PT - End of Session Activity Tolerance: Patient limited by fatigue Patient left: in bed;with call bell/phone within reach   GP     Vena Austria 01/04/2013, 10:44 AM Durenda Hurt. Renaldo Fiddler, [facility] Acute Rehab Services Pager [phone]

## 2013-01-04 NOTE — Progress Notes (Signed)
The patient's daughter-in-law was talked to this morning at 0640 concerning the patient's fall last night.  The daughter-in-law stated that the patient's family was already on the way to the hospital.  The patient does not have any complaints of pain this morning, even when performing ROM exercises.

## 2013-01-04 NOTE — Progress Notes (Signed)
The patient had an unwitnessed fall at 0010.  The bed alarm was on and red socks were in place.  The patient stated that she forgot where she was and tried to get up to go to the bathroom.  She said that she slid off of the bed and onto her knees.  The patient was on her knees facing the bed when the RN came into the room.  The patient originally complained of pain in her knees and toes just after getting back into bed.  She did have slight new bruising on her knees.  There also appeared to be a new bruise on her left hip, but she denied hitting her hip.  She stated that the hip was not painful on palpation of the bruised area and no facial grimacing was noted.  VS were taken and are in the patient's chart.  A neuro check was performed and no abnormalities were noted.  Donnamarie Poag was notified.  New orders were given to administer Tylenol to the patient and to place ice packs on the patient's extremities that were affected.  By the time the Tylenol was given to the patient and the ice packs were placed on her knees, the patient was not complaining of any pain.  The RN will continue to monitor the patient.

## 2013-01-04 NOTE — Progress Notes (Addendum)
[facility]  Subjective  - She states her left leg feels a little better today.   Objective 131/59 61 98.6 F (37 C) (Oral) 20 97%  Intake/Output Summary (Last 24 hours) at 01/04/13 0804 Last data filed at 01/04/13 0603  Gross per 24 hour  Intake    480 ml  Output    725 ml  Net   -245 ml   VASC. LAB Studies:  ABI: Right 1.44; Left 1.21;  Left calf incision clean, dry without hematoma or drainage. Compartments soft left LE. Left foot warm to touch.  Assessment/Planning: Procedure(s): EMBOLECTOMY LEFT LEG, with patch angioplasty  5 Days Post-OpSurgeon(s): Nada Libman, MD PT/OT for mobility     Thomasena Edis [facility] 01/04/2013 8:04 AM --  Laboratory Lab Results:  Recent Labs  01/02/13 0150 01/03/13 0542  WBC 15.7* 11.8*  HGB 8.8* 9.1*  HCT 26.3* 27.5*  PLT 135* 148*   BMET  Recent Labs  01/02/13 0150 01/03/13 0542  NA 137 138  K 3.7 3.5  CL 108 106  CO2 21 18*  GLUCOSE 123* 89  BUN 18 15  CREATININE 0.94 0.94  CALCIUM 9.3 9.7    COAG Lab Results  Component Value Date   INR 1.38 12/29/2012   INR 2.1 03/11/2009   INR 1.06 12/28/2008   No results found for this basename: PTT    -okay for discharge from [facility] point of view. -f/u with Dr. Delight Hoh in 2 weeks.   Doreatha Massed 01/04/2013 8:57 AM

## 2013-01-04 NOTE — Progress Notes (Signed)
ANTICOAGULATION CONSULT NOTE - Initial Consult  Pharmacy Consult for Xarelto Indication: Afib, Renal thrombus, DVT  No Known Allergies  Patient Measurements: Height: 5\' 4"  (162.6 cm) Weight: 158 lb 1.6 oz (71.714 kg) (Bed Scale ) IBW/kg (Calculated) : 54.7 Heparin Dosing Weight:   Vital Signs: Temp: 97.3 F (36.3 C) (11/26 1001) Temp src: Oral (11/26 1001) BP: 154/54 mmHg (11/26 1001) Pulse Rate: 67 (11/26 1001)  Labs:  Recent Labs  01/02/13 0150 01/02/13 1141 01/02/13 1900 01/03/13 0542 01/04/13 0635  HGB 8.8*  --   --  9.1*  --   HCT 26.3*  --   --  27.5*  --   PLT 135*  --   --  148*  --   APTT 81*  --   --  90*  --   HEPARINUNFRC 0.14* 0.36 0.45 0.44  --   CREATININE 0.94  --   --  0.94 0.95    Estimated Creatinine Clearance: 39 ml/min (by C-G formula based on Cr of 0.95).   Medical History: Past Medical History  Diagnosis Date  . Sick sinus syndrome     s/p PPM by Dr Juanda Chance (MDT)  . Hypertension   . FHx: mastectomy     for breast cancer  . Persistent atrial fibrillation     on pradaxa  . Gastro - esophageal reflux disease   . Allergic rhinitis due to pollen   . Other specified acquired hypothyroidism   . Body mass index 28.0-28.9, adult   . Other and unspecified hyperlipidemia   . Encounter for long-term (current) use of other medications   . Other B-complex deficiencies   . Memory loss   . Pain in limb   . Dizziness and giddiness   . Diastolic dysfunction      Assessment: 79 yo F brought to [facility] ED with LLQ abdominal pain. 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 She was then transferred to [facility] for further management. Patient was on pradaxa 150 mg twice daily PTA for Afib. Per patient's relative, last dose of pradaxa was taken around 1900 on 11/19.   PMH: HTN, Afib, Sick sinus syndrome, Memory loss, diastolic dysfunction, GERD, hypothyroidism, HLD  Anticoagulation: (PTA dabigatran, 11/20 BL aPTT 116),  Renal/celiac thrombi. New large LLE DVT. S/p left popliteal embolectomy 11/21. Heparin changed to Xarelto. Pt fell last PM 11/25.  Infectious Disease: afeb, WBC 11.8 slowly decreasing . LA up to 3.1 on 11/23.  Cardiovascular: HTN, Afib, SSS, HLD. VSS. Meds: Diltiazem, Bidil  Endocrinology: CBG 94-176, on SSI, levothyroxine.   Gastrointestinal / Nutrition: po PPI, docusate, Miralax. Trouble swallowing. Dysmotility vs stricture (had little benefit from latest dilation several months ago.)  Neurology: lexapro resumed  Nephrology: renal thrombus, Scr 0.95  Pulmonary: RA 94%  Hematology / Oncology: Hgb 9.1  Hepatobili: 11/22 elevated AST/ALT, INR 1.38  PTA Medication Issues: oscal, dabigatran, lasix, claritin, potassium, vit B-12, vit D   Goal of Therapy:  Oral anticoagulation Monitor platelets by anticoagulation protocol: Yes   Plan:  Xarelto 15mg  BID x 21d, then 20mg  daily   [name], PharmD, BCPS Clinical Staff Pharmacist Pager [phone]  [name] 01/04/2013,10:43 AM

## 2013-01-04 NOTE — Progress Notes (Signed)
The patient stated that she did not want to the RN to wake her son up at this time of the night concerning the fall because she was not hurt and was "okay".  The patient agreed to allowing the RN to contact her son about the fall in the morning just prior to the change of shift.  The RN will carry out the patient's wishes.

## 2013-01-06 LAB — GLUCOSE, CAPILLARY: Glucose-Capillary: 144 mg/dL — ABNORMAL HIGH (ref 70–99)

## 2013-01-18 ENCOUNTER — Encounter: Payer: Self-pay | Admitting: *Deleted

## 2013-02-10 ENCOUNTER — Encounter: Payer: Self-pay | Admitting: Surgery

## 2013-02-13 ENCOUNTER — Encounter: Payer: Self-pay | Admitting: Surgery

## 2013-02-13 ENCOUNTER — Ambulatory Visit (INDEPENDENT_AMBULATORY_CARE_PROVIDER_SITE_OTHER): Payer: Self-pay | Admitting: Surgery

## 2013-02-13 VITALS — BP 158/69 | HR 88 | Ht 64.0 in | Wt 137.1 lb

## 2013-02-13 DIAGNOSIS — I739 Peripheral vascular disease, unspecified: Secondary | ICD-10-CM | POA: Insufficient documentation

## 2013-02-13 NOTE — Addendum Note (Signed)
Addended by: Mena Goes on: 02/13/2013 10:36 AM   Modules accepted: Orders

## 2013-02-13 NOTE — Progress Notes (Signed)
This is the patient's first postoperative visit.  On 12/29/2012 she underwent left leg embolectomy via a below knee popliteal artery exposure.  I also performed patch angioplasty of the popliteal artery.  The patient initially presented to the hospital with abdominal pain.  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  She had been on anticoagulations brachial fibrillation.  When I evaluated her, I could not feel a pulse in her left leg , and she had a palpable dorsalis pedis pulse on the right. Doppler ultrasound showed an occlusion of the distal superficial femoral artery she was taken to the operating room where a subacute thrombus was evacuated.  Her only complaint today is that of generalized weakness and left leg swelling.  The below knee incision has healed nicely.  She has a multiphasic Doppler signal in the left anterior tibial and posterior tibial artery.  Overall she is recovering nicely.  I have scheduled her to come back for a duplex ultrasound of her patch angioplasty in the left popliteal artery.  If she continues to have swelling at that time I will consider utilizing a compression stockings.

## 2013-05-26 ENCOUNTER — Encounter: Payer: Self-pay | Admitting: Surgery

## 2013-05-29 ENCOUNTER — Ambulatory Visit: Payer: Medicare HMO | Admitting: Surgery

## 2013-05-29 ENCOUNTER — Other Ambulatory Visit (HOSPITAL_COMMUNITY): Payer: Medicare HMO

## 2013-05-29 ENCOUNTER — Encounter (HOSPITAL_COMMUNITY): Payer: Medicare HMO

## 2013-07-19 ENCOUNTER — Encounter: Payer: Self-pay | Admitting: *Deleted

## 2013-07-20 ENCOUNTER — Telehealth: Payer: Self-pay | Admitting: Cardiology

## 2013-07-20 NOTE — Telephone Encounter (Signed)
Certified letter sent 

## 2013-07-31 ENCOUNTER — Emergency Department (HOSPITAL_COMMUNITY): Payer: Medicare HMO

## 2013-07-31 ENCOUNTER — Encounter (HOSPITAL_COMMUNITY): Payer: Self-pay | Admitting: Emergency Medicine

## 2013-07-31 ENCOUNTER — Emergency Department (HOSPITAL_COMMUNITY)
Admission: EM | Admit: 2013-07-31 | Discharge: 2013-08-01 | Disposition: A | Discharge status: Home / Self Care | Payer: Medicare HMO | Attending: Emergency Medicine | Admitting: Emergency Medicine

## 2013-07-31 DIAGNOSIS — E038 Other specified hypothyroidism: Secondary | ICD-10-CM | POA: Insufficient documentation

## 2013-07-31 DIAGNOSIS — Z8709 Personal history of other diseases of the respiratory system: Secondary | ICD-10-CM | POA: Insufficient documentation

## 2013-07-31 DIAGNOSIS — E785 Hyperlipidemia, unspecified: Secondary | ICD-10-CM | POA: Insufficient documentation

## 2013-07-31 DIAGNOSIS — I10 Essential (primary) hypertension: Secondary | ICD-10-CM | POA: Insufficient documentation

## 2013-07-31 DIAGNOSIS — R5383 Other fatigue: Secondary | ICD-10-CM

## 2013-07-31 DIAGNOSIS — J90 Pleural effusion, not elsewhere classified: Secondary | ICD-10-CM | POA: Insufficient documentation

## 2013-07-31 DIAGNOSIS — I498 Other specified cardiac arrhythmias: Secondary | ICD-10-CM | POA: Insufficient documentation

## 2013-07-31 DIAGNOSIS — K219 Gastro-esophageal reflux disease without esophagitis: Secondary | ICD-10-CM | POA: Insufficient documentation

## 2013-07-31 DIAGNOSIS — E876 Hypokalemia: Secondary | ICD-10-CM | POA: Insufficient documentation

## 2013-07-31 DIAGNOSIS — R5381 Other malaise: Secondary | ICD-10-CM | POA: Insufficient documentation

## 2013-07-31 DIAGNOSIS — Z950 Presence of cardiac pacemaker: Secondary | ICD-10-CM | POA: Insufficient documentation

## 2013-07-31 DIAGNOSIS — Z79899 Other long term (current) drug therapy: Secondary | ICD-10-CM | POA: Insufficient documentation

## 2013-07-31 LAB — BASIC METABOLIC PANEL
BUN: 13 mg/dL (ref 6–23)
CO2: 27 meq/L (ref 19–32)
Calcium: 10.5 mg/dL (ref 8.4–10.5)
Chloride: 103 mEq/L (ref 96–112)
Creatinine, Ser: 0.97 mg/dL (ref 0.50–1.10)
GFR calc Af Amer: 58 mL/min — ABNORMAL LOW (ref >90)
GFR, EST NON AFRICAN AMERICAN: 50 mL/min — AB (ref >90)
Glucose, Bld: 103 mg/dL — ABNORMAL HIGH (ref 70–99)
POTASSIUM: 2.8 meq/L — AB (ref 3.7–5.3)
SODIUM: 146 meq/L (ref 137–147)

## 2013-07-31 LAB — CBC
HEMATOCRIT: 36.3 % (ref 36.0–46.0)
HEMOGLOBIN: 11.6 g/dL — AB (ref 12.0–15.0)
MCH: 28.4 pg (ref 26.0–34.0)
MCHC: 32 g/dL (ref 30.0–36.0)
MCV: 88.8 fL (ref 78.0–100.0)
Platelets: 172 10*3/uL (ref 150–400)
RBC: 4.09 MIL/uL (ref 3.87–5.11)
RDW: 14.7 % (ref 11.5–15.5)
WBC: 6.9 10*3/uL (ref 4.0–10.5)

## 2013-07-31 LAB — I-STAT TROPONIN, ED: Troponin i, poc: 0.02 ng/mL (ref 0.00–0.08)

## 2013-07-31 NOTE — ED Notes (Signed)
Potasium 2.8 from lab.

## 2013-07-31 NOTE — ED Notes (Signed)
Patient states she is having left shoulder pain, states she has been having feelings of rapid heart rate in her chest and then it slowing down.  Patient denies any shortness of breath at this time.  Patient does have a pacemaker.  Patient states she has been monitoring her pulse during the day and the lowest it got was down in the 30's.

## 2013-07-31 NOTE — ED Notes (Signed)
Takiyah Bohnsack - [phone] ( son ) .

## 2013-08-01 LAB — I-STAT TROPONIN, ED: Troponin i, poc: 0.03 ng/mL (ref 0.00–0.08)

## 2013-08-01 LAB — BASIC METABOLIC PANEL
BUN: 11 mg/dL (ref 6–23)
CHLORIDE: 104 meq/L (ref 96–112)
CO2: 26 meq/L (ref 19–32)
Calcium: 9.7 mg/dL (ref 8.4–10.5)
Creatinine, Ser: 0.9 mg/dL (ref 0.50–1.10)
GFR calc Af Amer: 63 mL/min — ABNORMAL LOW (ref >90)
GFR calc non Af Amer: 55 mL/min — ABNORMAL LOW (ref >90)
Glucose, Bld: 95 mg/dL (ref 70–99)
POTASSIUM: 2.9 meq/L — AB (ref 3.7–5.3)
SODIUM: 145 meq/L (ref 137–147)

## 2013-08-01 MED ORDER — POTASSIUM CHLORIDE 10 MEQ/100ML IV SOLN
10.0000 meq | Freq: Once | INTRAVENOUS | Status: AC
  Administered 2013-08-01: 10 meq via INTRAVENOUS
  Filled 2013-08-01: qty 100

## 2013-08-01 MED ORDER — POTASSIUM CHLORIDE CRYS ER 20 MEQ PO TBCR
40.0000 meq | EXTENDED_RELEASE_TABLET | Freq: Once | ORAL | Status: AC
  Administered 2013-08-01: 40 meq via ORAL
  Filled 2013-08-01: qty 2

## 2013-08-01 NOTE — ED Provider Notes (Signed)
CSN: [id number]     Arrival date & time 07/31/13  2038 History   First MD Initiated Contact with Patient 08/01/13 0025     Chief Complaint  Patient presents with  . Shoulder Pain  . Bradycardia  . Fall      The history is provided by the patient.  Patient reports earlier in the day she noted her pulse was low (30s) and then it would elevate She denies weakness/syncope.  No new CP/SOB No new cough.    She also reports left shoulder pain, but denies any direct trauma  She otherwise denies any other complaints - no fever/vomiting/syncope  Past Medical History  Diagnosis Date  . Sick sinus syndrome     s/p PPM by Dr Olevia Perches (MDT)  . Hypertension   . FHx: mastectomy     for breast cancer  . Persistent atrial fibrillation     on pradaxa  . Gastro - esophageal reflux disease   . Allergic rhinitis due to pollen   . Other specified acquired hypothyroidism   . Body mass index 28.0-28.9, adult   . Other and unspecified hyperlipidemia   . Encounter for long-term (current) use of other medications   . Other B-complex deficiencies   . Memory loss   . Pain in limb   . Dizziness and giddiness   . Diastolic dysfunction    Past Surgical History  Procedure Laterality Date  . Pacemaker insertion      by Dr Olevia Perches for tachy/brady syndrome (MDT)  . Mastectomy    . Tonsillectomy    . Appendectomy    . Embolectomy Left 12/30/2012    Procedure: EMBOLECTOMY LEFT LEG, with patch angioplasty;  Surgeon: Serafina Mitchell, MD;  Location: [facility] OR;  Service: Vascular;  Laterality: Left;   Family History  Problem Relation Age of Onset  . Heart disease Maternal Grandfather   . Diabetes Sister   . Tuberculosis Mother     father  . Diabetes Brother   . Stroke Brother    History  Substance Use Topics  . Smoking status: Never Smoker   . Smokeless tobacco: Not on file  . Alcohol Use: No   OB History   Grav Para Term Preterm Abortions TAB SAB Ect Mult Living                 Review of  Systems  Constitutional: Negative for fever.  Respiratory: Negative for shortness of breath.   Cardiovascular: Negative for chest pain.  Gastrointestinal: Negative for vomiting.  Neurological: Negative for syncope.  All other systems reviewed and are negative.     Allergies  Review of patient's allergies indicates no known allergies.  Home Medications   Prior to Admission medications   Medication Sig Start Date End Date Taking? Authorizing Joenathan Sakuma  calcium carbonate (OS-CAL) 600 MG TABS tablet Take 600 mg by mouth daily with breakfast.   Yes Historical Dezmen Alcock, MD  Cyanocobalamin (VITAMIN B-12 CR) 1500 MCG TBCR Take 1,500 mcg by mouth daily.   Yes Historical Cedrick Partain, MD  diltiazem (DILACOR XR) 180 MG 24 hr capsule Take 180 mg by mouth daily.   Yes Historical Odai Wimmer, MD  docusate sodium 100 MG CAPS Take 100 mg by mouth daily. 01/03/13  Yes Barton Dubois, MD  donepezil (ARICEPT) 5 MG tablet Take 5 mg by mouth at bedtime.   Yes Historical Torryn Hudspeth, MD  escitalopram (LEXAPRO) 10 MG tablet Take 10 mg by mouth daily.   Yes Historical Destyne Goodreau, MD  furosemide (LASIX)  40 MG tablet Take 1 tablet (40 mg total) by mouth daily. 01/03/13  Yes Barton Dubois, MD  isosorbide-hydrALAZINE (BIDIL) 20-37.5 MG per tablet Take 1 tablet by mouth 2 (two) times daily. 01/03/13  Yes Barton Dubois, MD  lansoprazole (PREVACID) 30 MG capsule Take 30 mg by mouth daily at 12 noon.   Yes Historical Pairlee Sawtell, MD  levothyroxine (SYNTHROID, LEVOTHROID) 100 MCG tablet Take 100 mcg by mouth daily before breakfast.   Yes Historical Jarold Macomber, MD  loratadine (CLARITIN) 10 MG tablet Take 10 mg by mouth daily.   Yes Historical Rosellen Lichtenberger, MD  Multiple Vitamin (MULTIVITAMIN WITH MINERALS) TABS tablet Take 1 tablet by mouth daily.   Yes Historical Lynzy Rawles, MD  polyethylene glycol (MIRALAX / GLYCOLAX) packet Take 17 g by mouth daily. 01/03/13  Yes Barton Dubois, MD  potassium chloride (K-DUR) 10 MEQ tablet Take 10 mEq by  mouth 3 (three) times daily.    Yes Historical Kendarius Vigen, MD  Rivaroxaban (XARELTO) 20 MG TABS tablet Take 1 tablet (20 mg total) by mouth daily with supper. 01/25/13  Yes Barton Dubois, MD  Vitamin D, Ergocalciferol, (DRISDOL) 50000 UNITS CAPS capsule Take 50,000 Units by mouth every 7 (seven) days. Sunday   Yes Historical Lucetta Baehr, MD   BP 162/53  Pulse 61  Temp(Src) 98.5 F (36.9 C) (Oral)  Resp 16  SpO2 99% Physical Exam CONSTITUTIONAL: Well developed/well nourished HEAD: Normocephalic/atraumatic EYES: EOMI/ ENMT: Mucous membranes moist NECK: supple no meningeal signs SPINE:entire spine nontender CV: S1/S2 noted, no murmurs/rubs/gallops noted LUNGS: decresed BS in left field, no apparent distress ABDOMEN: soft, nontender, no rebound or guarding NEURO: Pt is awake/alert, moves all extremitiesx4 EXTREMITIES: pulses normal, full ROM. Bruises to each patella, but no tenderness and full ROM lower extremities without difficulty. She can fully range left shoulder without difficulty, no signs of trauma SKIN: warm, color normal PSYCH: no abnormalities of mood noted   ED Course  Procedures   1:44 AM Pt stable, denies any current complaints I spoke to [employer] on call No signs of pacemaker malfunction.  Battery life is appropriate. No significant brady arrythmias  Suspect she may have had ectopy as cause of feeling pulse was low  She now reports she felt weak and went onto her knees this morning but has had this before, no syncope She did land on her knees but no other trauma. No head injury  2:13 AM Plan is to give potassium, monitor and reassess Pt would prefer to go home and not be admitted 6:53 AM Pt monitored over night She feels well She never had any CP/SOB.   She was given potassium replacement She would like to go home.  She is ambulatory.   I have low suspicion for ACS.  She can f/u as outpatient for pleural effusion with PCP.  She is scheduled to see cardiology at end  of month.    Labs Review Labs Reviewed  CBC - Abnormal; Notable for the following:    Hemoglobin 11.6 (*)    All other components within normal limits  BASIC METABOLIC PANEL - Abnormal; Notable for the following:    Potassium 2.8 (*)    Glucose, Bld 103 (*)    GFR calc non Af Amer 50 (*)    GFR calc Af Amer 58 (*)    All other components within normal limits  BASIC METABOLIC PANEL - Abnormal; Notable for the following:    Potassium 2.9 (*)    GFR calc non Af Amer 55 (*)  GFR calc Af Amer 63 (*)    All other components within normal limits  I-STAT TROPOININ, ED  Randolm Idol, ED    Imaging Review Dg Chest 2 View  07/31/2013   CLINICAL DATA:  Tachycardia.  Hypertension.  Atrial fibrillation.  EXAM: CHEST  2 VIEW  COMPARISON:  04/07/2013  FINDINGS: Moderate left pleural effusion. Dual lead pacer in place. Mild cardiomegaly.  Mild biapical pleural parenchymal scarring.  IMPRESSION: 1. Moderate left pleural effusion.  Mild cardiomegaly.   Electronically Signed   By: Sherryl Barters M.D.   On: 07/31/2013 22:04     EKG Interpretation   Date/Time:  Monday July 31 2013 21:10:39 EDT Ventricular Rate:  60 PR Interval:    QRS Duration: 174 QT Interval:  526 QTC Calculation: 526 R Axis:   -78 Text Interpretation:  Ventricular-paced rhythm Abnormal ECG artifact noted  Confirmed by Christy Gentles  MD, [place] ([id number]) on 08/01/2013 12:26:53 AM        EKG Interpretation  Date/Time:  Monday July 31 2013 21:10:39 EDT Ventricular Rate:  60 PR Interval:    QRS Duration: 174 QT Interval:  526 QTC Calculation: 526 R Axis:   -78 Text Interpretation:  Ventricular-paced rhythm Abnormal ECG artifact noted Confirmed by Christy Gentles  MD, Elenore Rota ([id number]) on 08/01/2013 12:26:53 AM       MDM   Final diagnoses:  Other fatigue  Hypokalemia  Pleural effusion, left    Nursing notes including past medical history and social history reviewed and considered in documentation xrays reviewed and  considered Labs/vital reviewed and considered Previous records reviewed and considered     Sharyon Cable, MD 08/01/13 [id number]

## 2013-08-01 NOTE — ED Notes (Signed)
Spoke with pt at home via telephone to confirm transportation occurred

## 2013-08-01 NOTE — ED Notes (Signed)
Pt states, "My son came to get me & could not find me. I have called him & he is on his way back here. I need to go to the Emergency Room entrance." pt taken via wheelchair to ED lobby, Idalia Needle, EMT aware of transportation issue, pt A&O x4, verbalizes understanding of discharge instructions

## 2013-08-01 NOTE — ED Notes (Signed)
Dr. Christy Gentles explained results of tests and discharge plan to pt.

## 2013-08-01 NOTE — Discharge Instructions (Signed)
Hypokalemia Hypokalemia means that the amount of potassium in the blood is lower than normal.Potassium is a chemical, called an electrolyte, that helps regulate the amount of fluid in the body. It also stimulates muscle contraction and helps nerves function properly.Most of the body's potassium is inside of cells, and only a very small amount is in the blood. Because the amount in the blood is so small, minor changes can be life-threatening. CAUSES  Antibiotics.  Diarrhea or vomiting.  Using laxatives too much, which can cause diarrhea.  Chronic kidney disease.  Water pills (diuretics).  Eating disorders (bulimia).  Low magnesium level.  Sweating a lot. SIGNS AND SYMPTOMS  Weakness.  Constipation.  Fatigue.  Muscle cramps.  Mental confusion.  Skipped heartbeats or irregular heartbeat (palpitations).  Tingling or numbness. DIAGNOSIS  Your health care provider can diagnose hypokalemia with blood tests. In addition to checking your potassium level, your health care provider may also check other lab tests. TREATMENT Hypokalemia can be treated with potassium supplements taken by mouth or adjustments in your current medicines. If your potassium level is very low, you may need to get potassium through a vein (IV) and be monitored in the hospital. A diet high in potassium is also helpful. Foods high in potassium are:  Nuts, such as peanuts and pistachios.  Seeds, such as sunflower seeds and pumpkin seeds.  Peas, lentils, and lima beans.  Whole grain and bran cereals and breads.  Fresh fruit and vegetables, such as apricots, avocado, bananas, cantaloupe, kiwi, oranges, tomatoes, asparagus, and potatoes.  Orange and tomato juices.  Red meats.  Fruit yogurt. HOME CARE INSTRUCTIONS  Take all medicines as prescribed by your health care provider.  Maintain a healthy diet by including nutritious food, such as fruits, vegetables, nuts, whole grains, and lean meats.  If  you are taking a laxative, be sure to follow the directions on the label. SEEK MEDICAL CARE IF:  Your weakness gets worse.  You feel your heart pounding or racing.  You are vomiting or having diarrhea.  You are diabetic and having trouble keeping your blood glucose in the normal range. SEEK IMMEDIATE MEDICAL CARE IF:  You have chest pain, shortness of breath, or dizziness.  You are vomiting or having diarrhea for more than 2 days.  You faint. MAKE SURE YOU:   Understand these instructions.  Will watch your condition.  Will get help right away if you are not doing well or get worse. Document Released: 01/26/2005 Document Revised: 11/16/2012 Document Reviewed: 07/29/2012 [facility] Patient Information 2015 [place], [place]. This information is not intended to replace advice given to you by your health care provider. Make sure you discuss any questions you have with your health care provider.   Your caregiver has seen you today because you are having problems with feelings of weakness, dizziness, and/or fatigue. Weakness has many different causes, some of which are common and others are very rare. Your caregiver has considered some of the most common causes of weakness and feels it is safe for you to go home and be observed. Not every illness or injury can be identified during an emergency department visit, thus follow-up with your primary healthcare provider is important. Medical conditions can also worsen, so it is also important to return immediately as directed below, or if you have other serious concerns develop.  RETURN IMMEDIATELY IF  you develop new shortness of breath, chest pain, fever, have difficulty moving parts of your body (new weakness, numbness, or incoordination), sudden change  in speech, vision, swallowing, or understanding, faint or develop new dizziness, severe headache, become poorly responsive or have an altered mental status compared to baseline for you, new rash,  abdominal pain, or bloody stools,  Return sooner also if you develop new problems for which you have not talked to your caregiver but you feel may be emergency medical conditions.     Pleural Effusion The lining covering your lungs and the inside of your chest is called the pleura. Usually, the space between the 2 pleura contains no air and only a thin layer of fluid. A pleural effusion is an abnormal buildup of fluid in the pleural space. Fluid gathers when there is increased pressure in the lung vessels. This forces fluids out of the lungs and into the pleural space. Vessels may also leak fluids when there are infections, such as pneumonia, or other causes of soreness and redness (inflammation). Fluids leak into the lungs when protein in the blood is low or when certain vessels (lymphatics) are blocked. Finding a pleural effusion is important because it is usually caused by another disease. In order to treat a pleural effusion, your health care provider needs to find its cause. If left untreated, a large amount of fluid can build up and cause collapse of the lung. CAUSES   Heart failure.  Infections (pneumonia, tuberculosis), pulmonary embolism, pulmonary infarction.  Cancer (primary lung and metastatic), asbestosis.  Liver failure (cirrhosis).  Nephrotic syndrome, peritoneal dialysis, kidney problems (uremia).  Collagen vascular disease (systemic lupus erythematosis, rheumatoid arthritis).  Injury (trauma) to the chest or rupture of the digestive tube (esophagus).  Material in the chest or pleural space (hemothorax, chylothorax).  Pancreatitis.  Surgery.  Drug reactions. SYMPTOMS  A pleural effusion can decrease the amount of space available for breathing and make you short of breath. The fluid can become infected, which may cause pain and fever. Often, the pain is worse when taking a deep breath. The underlying disease (heart failure, pneumonia, blood clot, tuberculosis, cancer)  may also cause symptoms. DIAGNOSIS   Your health care provider can usually tell what is wrong by talking to you (taking a history), doing an exam, and taking a routine X-ray. If the X-ray shows fluid in your chest, often fluid is removed from your chest with a needle for testing (diagnostic thoracentesis).  Sometimes, more specialized X-rays may be needed.  Sometimes, a small piece of tissue is removed and examined by a specialist (biopsy). TREATMENT  Treatment varies based on what caused the pleural effusion. Treatments include:  Removing as much fluid as possible using a needle (thoracentesis) to improve the cough and shortness of breath. This is a simple procedure which can be done at bedside. The risks are bleeding, infection, collapse of a lung, or low blood pressure.  Placing a tube in the chest to drain the effusion (tube thoracostomy). This is often used when there is an infection in the fluid. This is a simple procedure which can often be done at bedside or in a clinic. The procedure may be painful. The risks are the same as using a needle to drain the fluid. The chest tube usually remains for a few days and is connected to suction to improve fluid drainage. The tube, after placement, usually does not cause much discomfort.  Surgical removal of fibrous debris in and around the pleural space (decortication). This may be done with a flexible telescope (thoracoscope) through a small or large cut (incision). This is helpful for patients who have  fibrosis or scar tissue that prevents complete lung expansion. The risks are infection, blood loss, and side effects from general anesthesia.  Sometimes, a procedure called pleurodesis is done. A chest tube is placed and the fluid is drained. Next, an agent (tetracycline, talc powder) is added to the pleural space. This causes the lung and chest wall to stick together (adhesion). This leaves no potential space for fluid to build up. The risks include  infection, blood loss, and side effects from general anesthesia.  If the effusion is caused by infection, it may be treated with antibiotics and improve without draining. HOME CARE INSTRUCTIONS   Take any medicines exactly as prescribed.  Follow up with your health care provider as directed.  Monitor your exercise capacity (the amount of walking you can do before you get short of breath).  Do not use any tobacco products including cigaretts, chewing tobacco, or electronic cigarettes. SEEK MEDICAL CARE IF:   Your exercise capacity seems to get worse or does not improve with time.  You do not recover from your illness.  You have drainage, redness, swelling, or pain at any incision or puncture sites. SEEK IMMEDIATE MEDICAL CARE IF:   Shortness of breath or chest pain develops or gets worse.  You have a fever.  You develop a new cough, especially if the mucus (phlegm) is discolored. MAKE SURE YOU:   Understand these instructions.  Will watch your condition.  Will get help right away if you are not doing well or get worse. Document Released: 01/26/2005 Document Revised: 01/31/2013 Document Reviewed: 09/17/2006 [facility] Patient Information 2015 [place], [place]. This information is not intended to replace advice given to you by your health care provider. Make sure you discuss any questions you have with your health care provider.

## 2013-08-08 ENCOUNTER — Encounter: Payer: Medicare HMO | Admitting: Internal Medicine

## 2014-02-05 ENCOUNTER — Emergency Department (HOSPITAL_COMMUNITY): Payer: Commercial Managed Care - HMO | Admitting: Certified Registered Nurse Anesthetist

## 2014-02-05 ENCOUNTER — Encounter (HOSPITAL_COMMUNITY)
Admission: EM | Disposition: A | Discharge status: Home / Self Care | Payer: Self-pay | Source: Home / Self Care | Attending: Vascular Surgery

## 2014-02-05 ENCOUNTER — Encounter (HOSPITAL_COMMUNITY): Payer: Self-pay | Admitting: Emergency Medicine

## 2014-02-05 ENCOUNTER — Inpatient Hospital Stay (HOSPITAL_COMMUNITY)
Admission: EM | Admit: 2014-02-05 | Discharge: 2014-02-08 | DRG: 253 | Disposition: A | Discharge status: Home / Self Care | Payer: Commercial Managed Care - HMO | Attending: Vascular Surgery | Admitting: Vascular Surgery

## 2014-02-05 DIAGNOSIS — R339 Retention of urine, unspecified: Secondary | ICD-10-CM | POA: Diagnosis present

## 2014-02-05 DIAGNOSIS — N390 Urinary tract infection, site not specified: Secondary | ICD-10-CM | POA: Diagnosis present

## 2014-02-05 DIAGNOSIS — E785 Hyperlipidemia, unspecified: Secondary | ICD-10-CM | POA: Diagnosis present

## 2014-02-05 DIAGNOSIS — I482 Chronic atrial fibrillation: Secondary | ICD-10-CM | POA: Diagnosis present

## 2014-02-05 DIAGNOSIS — I2510 Atherosclerotic heart disease of native coronary artery without angina pectoris: Secondary | ICD-10-CM | POA: Diagnosis present

## 2014-02-05 DIAGNOSIS — I739 Peripheral vascular disease, unspecified: Secondary | ICD-10-CM | POA: Diagnosis present

## 2014-02-05 DIAGNOSIS — I75011 Atheroembolism of right upper extremity: Secondary | ICD-10-CM | POA: Diagnosis present

## 2014-02-05 DIAGNOSIS — Z7901 Long term (current) use of anticoagulants: Secondary | ICD-10-CM | POA: Diagnosis not present

## 2014-02-05 DIAGNOSIS — E876 Hypokalemia: Secondary | ICD-10-CM | POA: Diagnosis not present

## 2014-02-05 DIAGNOSIS — Z833 Family history of diabetes mellitus: Secondary | ICD-10-CM | POA: Diagnosis not present

## 2014-02-05 DIAGNOSIS — E039 Hypothyroidism, unspecified: Secondary | ICD-10-CM | POA: Diagnosis present

## 2014-02-05 DIAGNOSIS — I110 Hypertensive heart disease with heart failure: Secondary | ICD-10-CM | POA: Diagnosis present

## 2014-02-05 DIAGNOSIS — Y838 Other surgical procedures as the cause of abnormal reaction of the patient, or of later complication, without mention of misadventure at the time of the procedure: Secondary | ICD-10-CM | POA: Diagnosis not present

## 2014-02-05 DIAGNOSIS — Z823 Family history of stroke: Secondary | ICD-10-CM

## 2014-02-05 DIAGNOSIS — K219 Gastro-esophageal reflux disease without esophagitis: Secondary | ICD-10-CM | POA: Diagnosis present

## 2014-02-05 DIAGNOSIS — S065X9A Traumatic subdural hemorrhage with loss of consciousness of unspecified duration, initial encounter: Secondary | ICD-10-CM

## 2014-02-05 DIAGNOSIS — I5032 Chronic diastolic (congestive) heart failure: Secondary | ICD-10-CM | POA: Diagnosis present

## 2014-02-05 DIAGNOSIS — I97618 Postprocedural hemorrhage and hematoma of a circulatory system organ or structure following other circulatory system procedure: Secondary | ICD-10-CM | POA: Diagnosis not present

## 2014-02-05 DIAGNOSIS — Z950 Presence of cardiac pacemaker: Secondary | ICD-10-CM | POA: Diagnosis not present

## 2014-02-05 DIAGNOSIS — Y92239 Unspecified place in hospital as the place of occurrence of the external cause: Secondary | ICD-10-CM | POA: Diagnosis not present

## 2014-02-05 DIAGNOSIS — S065XAA Traumatic subdural hemorrhage with loss of consciousness status unknown, initial encounter: Secondary | ICD-10-CM

## 2014-02-05 DIAGNOSIS — I7090 Unspecified atherosclerosis: Secondary | ICD-10-CM

## 2014-02-05 DIAGNOSIS — Z9010 Acquired absence of unspecified breast and nipple: Secondary | ICD-10-CM | POA: Diagnosis present

## 2014-02-05 DIAGNOSIS — F0390 Unspecified dementia without behavioral disturbance: Secondary | ICD-10-CM | POA: Diagnosis present

## 2014-02-05 DIAGNOSIS — I998 Other disorder of circulatory system: Secondary | ICD-10-CM | POA: Diagnosis present

## 2014-02-05 DIAGNOSIS — I4891 Unspecified atrial fibrillation: Secondary | ICD-10-CM | POA: Diagnosis present

## 2014-02-05 DIAGNOSIS — I495 Sick sinus syndrome: Secondary | ICD-10-CM | POA: Diagnosis present

## 2014-02-05 DIAGNOSIS — I749 Embolism and thrombosis of unspecified artery: Secondary | ICD-10-CM | POA: Diagnosis present

## 2014-02-05 DIAGNOSIS — I742 Embolism and thrombosis of arteries of the upper extremities: Secondary | ICD-10-CM

## 2014-02-05 HISTORY — PX: EMBOLECTOMY: SHX44

## 2014-02-05 HISTORY — DX: Long term (current) use of anticoagulants: Z79.01

## 2014-02-05 LAB — BASIC METABOLIC PANEL
ANION GAP: 14 (ref 5–15)
BUN: 9 mg/dL (ref 6–23)
CHLORIDE: 105 meq/L (ref 96–112)
CO2: 22 mmol/L (ref 19–32)
Calcium: 10.3 mg/dL (ref 8.4–10.5)
Creatinine, Ser: 1.08 mg/dL (ref 0.50–1.10)
GFR calc Af Amer: 51 mL/min — ABNORMAL LOW (ref >90)
GFR calc non Af Amer: 44 mL/min — ABNORMAL LOW (ref >90)
Glucose, Bld: 106 mg/dL — ABNORMAL HIGH (ref 70–99)
POTASSIUM: 3.7 mmol/L (ref 3.5–5.1)
Sodium: 141 mmol/L (ref 135–145)

## 2014-02-05 LAB — CBC WITH DIFFERENTIAL/PLATELET
Basophils Absolute: 0 10*3/uL (ref 0.0–0.1)
Basophils Relative: 0 % (ref 0–1)
EOS ABS: 0.2 10*3/uL (ref 0.0–0.7)
EOS PCT: 3 % (ref 0–5)
HEMATOCRIT: 37 % (ref 36.0–46.0)
HEMOGLOBIN: 11.9 g/dL — AB (ref 12.0–15.0)
LYMPHS ABS: 3.3 10*3/uL (ref 0.7–4.0)
Lymphocytes Relative: 41 % (ref 12–46)
MCH: 27.9 pg (ref 26.0–34.0)
MCHC: 32.2 g/dL (ref 30.0–36.0)
MCV: 86.9 fL (ref 78.0–100.0)
MONO ABS: 0.5 10*3/uL (ref 0.1–1.0)
MONOS PCT: 6 % (ref 3–12)
NEUTROS PCT: 50 % (ref 43–77)
Neutro Abs: 4.1 10*3/uL (ref 1.7–7.7)
Platelets: 131 10*3/uL — ABNORMAL LOW (ref 150–400)
RBC: 4.26 MIL/uL (ref 3.87–5.11)
RDW: 15.2 % (ref 11.5–15.5)
WBC: 8.2 10*3/uL (ref 4.0–10.5)

## 2014-02-05 LAB — TYPE AND SCREEN
ABO/RH(D): O POS
ANTIBODY SCREEN: NEGATIVE

## 2014-02-05 LAB — PROTIME-INR
INR: 1.24 (ref 0.00–1.49)
Prothrombin Time: 15.7 seconds — ABNORMAL HIGH (ref 11.6–15.2)

## 2014-02-05 SURGERY — EMBOLECTOMY
Anesthesia: General | Site: Arm Upper | Laterality: Right

## 2014-02-05 MED ORDER — SODIUM CHLORIDE 0.9 % IV SOLN
INTRAVENOUS | Status: DC
  Administered 2014-02-06 – 2014-02-07 (×3): via INTRAVENOUS

## 2014-02-05 MED ORDER — HEPARIN (PORCINE) IN NACL 100-0.45 UNIT/ML-% IJ SOLN
INTRAMUSCULAR | Status: DC | PRN
  Administered 2014-02-05: 500 [IU]/h via INTRAVENOUS

## 2014-02-05 MED ORDER — HYDRALAZINE HCL 20 MG/ML IJ SOLN
5.0000 mg | INTRAMUSCULAR | Status: DC | PRN
  Filled 2014-02-05: qty 0.25

## 2014-02-05 MED ORDER — HEPARIN (PORCINE) IN NACL 100-0.45 UNIT/ML-% IJ SOLN
500.0000 [IU]/h | INTRAMUSCULAR | Status: DC
  Filled 2014-02-05: qty 250

## 2014-02-05 MED ORDER — METOPROLOL TARTRATE 1 MG/ML IV SOLN: 2.0000 mg | INTRAVENOUS | Status: DC | PRN

## 2014-02-05 MED ORDER — SODIUM CHLORIDE 0.9 % IV SOLN: 500.0000 mL | Freq: Once | INTRAVENOUS | Status: AC | PRN

## 2014-02-05 MED ORDER — THROMBIN 20000 UNITS EX SOLR
CUTANEOUS | Status: AC
  Filled 2014-02-05: qty 20000

## 2014-02-05 MED ORDER — PHENYLEPHRINE HCL 10 MG/ML IJ SOLN
10.0000 mg | INTRAMUSCULAR | Status: DC | PRN
  Administered 2014-02-05: 25 ug/min via INTRAVENOUS

## 2014-02-05 MED ORDER — LACTATED RINGERS IV SOLN
INTRAVENOUS | Status: DC | PRN
  Administered 2014-02-05: 20:00:00 via INTRAVENOUS

## 2014-02-05 MED ORDER — LABETALOL HCL 5 MG/ML IV SOLN: 10.0000 mg | INTRAVENOUS | Status: DC | PRN

## 2014-02-05 MED ORDER — MORPHINE SULFATE 2 MG/ML IJ SOLN
2.0000 mg | INTRAMUSCULAR | Status: DC | PRN
  Administered 2014-02-07: 2 mg via INTRAVENOUS
  Filled 2014-02-05: qty 1

## 2014-02-05 MED ORDER — SODIUM CHLORIDE 0.9 % IR SOLN
Status: DC | PRN
  Administered 2014-02-05: 500 mL

## 2014-02-05 MED ORDER — LIDOCAINE HCL (CARDIAC) 20 MG/ML IV SOLN
INTRAVENOUS | Status: DC | PRN
  Administered 2014-02-05: 60 mg via INTRAVENOUS

## 2014-02-05 MED ORDER — DEXTROSE 5 % IV SOLN
1.5000 g | INTRAVENOUS | Status: DC | PRN
  Administered 2014-02-05: 1.5 g via INTRAVENOUS

## 2014-02-05 MED ORDER — FENTANYL CITRATE 0.05 MG/ML IJ SOLN
INTRAMUSCULAR | Status: AC
  Filled 2014-02-05: qty 5

## 2014-02-05 MED ORDER — FENTANYL CITRATE 0.05 MG/ML IJ SOLN
INTRAMUSCULAR | Status: DC | PRN
  Administered 2014-02-05: 25 ug via INTRAVENOUS

## 2014-02-05 MED ORDER — FENTANYL CITRATE 0.05 MG/ML IJ SOLN: 25.0000 ug | INTRAMUSCULAR | Status: DC | PRN

## 2014-02-05 MED ORDER — HYDRALAZINE HCL 20 MG/ML IJ SOLN: 5.0000 mg | INTRAMUSCULAR | Status: DC | PRN

## 2014-02-05 MED ORDER — PROMETHAZINE HCL 25 MG/ML IJ SOLN: 6.2500 mg | INTRAMUSCULAR | Status: DC | PRN

## 2014-02-05 MED ORDER — LABETALOL HCL 5 MG/ML IV SOLN
10.0000 mg | INTRAVENOUS | Status: DC | PRN
  Filled 2014-02-05: qty 4

## 2014-02-05 MED ORDER — PROPOFOL 10 MG/ML IV BOLUS
INTRAVENOUS | Status: DC | PRN
  Administered 2014-02-05: 65 mg via INTRAVENOUS

## 2014-02-05 MED ORDER — HEPARIN SODIUM (PORCINE) 1000 UNIT/ML IJ SOLN
INTRAMUSCULAR | Status: AC
  Filled 2014-02-05: qty 1

## 2014-02-05 MED ORDER — HEPARIN SODIUM (PORCINE) 1000 UNIT/ML IJ SOLN
INTRAMUSCULAR | Status: DC | PRN
  Administered 2014-02-05: 5000 [IU] via INTRAVENOUS

## 2014-02-05 MED ORDER — DEXTROSE 5 % IV SOLN
1.5000 g | INTRAVENOUS | Status: DC
  Filled 2014-02-05: qty 1.5

## 2014-02-05 MED ORDER — LACTATED RINGERS IV SOLN
INTRAVENOUS | Status: DC | PRN
  Administered 2014-02-05: 21:00:00 via INTRAVENOUS

## 2014-02-05 MED ORDER — DOPAMINE-DEXTROSE 3.2-5 MG/ML-% IV SOLN
3.0000 ug/kg/min | INTRAVENOUS | Status: DC
  Filled 2014-02-05: qty 250

## 2014-02-05 MED ORDER — SUCCINYLCHOLINE CHLORIDE 20 MG/ML IJ SOLN
INTRAMUSCULAR | Status: DC | PRN
  Administered 2014-02-05: 80 mg via INTRAVENOUS

## 2014-02-05 MED ORDER — OXYCODONE-ACETAMINOPHEN 5-325 MG PO TABS
1.0000 | ORAL_TABLET | ORAL | Status: DC | PRN
  Administered 2014-02-06: 1 via ORAL
  Filled 2014-02-05 (×2): qty 1

## 2014-02-05 MED ORDER — HYDROMORPHONE HCL 1 MG/ML IJ SOLN
1.0000 mg | Freq: Once | INTRAMUSCULAR | Status: AC
Start: 2014-02-05 — End: 2014-02-05
  Administered 2014-02-05: 1 mg via INTRAVENOUS
  Filled 2014-02-05: qty 1

## 2014-02-05 MED ORDER — 0.9 % SODIUM CHLORIDE (POUR BTL) OPTIME
TOPICAL | Status: DC | PRN
  Administered 2014-02-05: 1000 mL

## 2014-02-05 SURGICAL SUPPLY — 50 items
BANDAGE ESMARK 6X9 LF (GAUZE/BANDAGES/DRESSINGS) IMPLANT
BNDG ESMARK 6X9 LF (GAUZE/BANDAGES/DRESSINGS)
CANISTER SUCTION 2500CC (MISCELLANEOUS) ×2 IMPLANT
CANNULA VESSEL 3MM 2 BLNT TIP (CANNULA) ×2 IMPLANT
CATH EMB 3FR 80CM (CATHETERS) ×2 IMPLANT
CATH EMB 4FR 80CM (CATHETERS) ×2 IMPLANT
CATH EMB 5FR 80CM (CATHETERS) IMPLANT
CLIP TI MEDIUM 24 (CLIP) ×2 IMPLANT
CLIP TI WIDE RED SMALL 24 (CLIP) ×2 IMPLANT
COVER SURGICAL LIGHT HANDLE (MISCELLANEOUS) ×2 IMPLANT
CUFF TOURNIQUET SINGLE 24IN (TOURNIQUET CUFF) IMPLANT
CUFF TOURNIQUET SINGLE 34IN LL (TOURNIQUET CUFF) IMPLANT
CUFF TOURNIQUET SINGLE 44IN (TOURNIQUET CUFF) IMPLANT
DECANTER SPIKE VIAL GLASS SM (MISCELLANEOUS) IMPLANT
DRAIN SNY 10X20 3/4 PERF (WOUND CARE) IMPLANT
DRAPE X-RAY CASS 24X20 (DRAPES) IMPLANT
DRSG COVADERM 4X8 (GAUZE/BANDAGES/DRESSINGS) IMPLANT
ELECT REM PT RETURN 9FT ADLT (ELECTROSURGICAL) ×2
ELECTRODE REM PT RTRN 9FT ADLT (ELECTROSURGICAL) ×1 IMPLANT
EVACUATOR SILICONE 100CC (DRAIN) IMPLANT
GLOVE BIO SURGEON STRL SZ 6.5 (GLOVE) ×4 IMPLANT
GLOVE BIO SURGEON STRL SZ7.5 (GLOVE) ×2 IMPLANT
GLOVE BIOGEL PI IND STRL 6.5 (GLOVE) ×3 IMPLANT
GLOVE BIOGEL PI IND STRL 7.0 (GLOVE) ×2 IMPLANT
GLOVE BIOGEL PI INDICATOR 6.5 (GLOVE) ×3
GLOVE BIOGEL PI INDICATOR 7.0 (GLOVE) ×2
GOWN STRL REUS W/ TWL LRG LVL3 (GOWN DISPOSABLE) ×4 IMPLANT
GOWN STRL REUS W/TWL LRG LVL3 (GOWN DISPOSABLE) ×4
KIT BASIN OR (CUSTOM PROCEDURE TRAY) ×2 IMPLANT
KIT ROOM TURNOVER OR (KITS) ×2 IMPLANT
LIQUID BAND (GAUZE/BANDAGES/DRESSINGS) IMPLANT
NS IRRIG 1000ML POUR BTL (IV SOLUTION) ×2 IMPLANT
PACK PERIPHERAL VASCULAR (CUSTOM PROCEDURE TRAY) ×2 IMPLANT
PAD ARMBOARD 7.5X6 YLW CONV (MISCELLANEOUS) ×4 IMPLANT
PADDING CAST COTTON 6X4 STRL (CAST SUPPLIES) IMPLANT
SET COLLECT BLD 21X3/4 12 (NEEDLE) IMPLANT
SPONGE SURGIFOAM ABS GEL 100 (HEMOSTASIS) IMPLANT
STAPLER VISISTAT 35W (STAPLE) IMPLANT
STOPCOCK 4 WAY LG BORE MALE ST (IV SETS) IMPLANT
SUT PROLENE 5 0 C 1 24 (SUTURE) IMPLANT
SUT PROLENE 6 0 CC (SUTURE) IMPLANT
SUT PROLENE 7 0 BV1 MDA (SUTURE) ×2 IMPLANT
SUT VIC AB 2-0 CTX 36 (SUTURE) ×2 IMPLANT
SUT VIC AB 3-0 SH 27 (SUTURE)
SUT VIC AB 3-0 SH 27X BRD (SUTURE) IMPLANT
SYR 3ML LL SCALE MARK (SYRINGE) ×4 IMPLANT
TRAY FOLEY CATH 16FRSI W/METER (SET/KITS/TRAYS/PACK) IMPLANT
TUBING EXTENTION W/L.L. (IV SETS) IMPLANT
UNDERPAD 30X30 INCONTINENT (UNDERPADS AND DIAPERS) ×2 IMPLANT
WATER STERILE IRR 1000ML POUR (IV SOLUTION) ×2 IMPLANT

## 2014-02-05 NOTE — ED Provider Notes (Signed)
CSN: [id number]     Arrival date & time 02/05/14  1909 History   First MD Initiated Contact with Patient 02/05/14 1929     Chief Complaint  Patient presents with  . Right Axillary Occlusion      (Consider location/radiation/quality/duration/timing/severity/associated sxs/prior Treatment) HPI Comments: Patient transferred from outside hospital with axillary artery occlusion and pulseless extremity. Patient reports pain in her right arm since waking up this morning. Denies any recent falls. She was taken off of anticoagulation in October per her family members. CT scan showed excellent artery occlusion. CT head was negative. Patient denies any chest pain or shortness of breath. No leg pain or leg swelling. No fevers or vomiting  The history is provided by the patient and the EMS personnel. The history is limited by the condition of the patient.    Past Medical History  Diagnosis Date  . Sick sinus syndrome     s/p PPM by Dr Olevia Perches (MDT)  . Hypertension   . FHx: mastectomy     for breast cancer  . Persistent atrial fibrillation     on pradaxa  . Gastro - esophageal reflux disease   . Allergic rhinitis due to pollen   . Other specified acquired hypothyroidism   . Body mass index 28.0-28.9, adult   . Other and unspecified hyperlipidemia   . Encounter for long-term (current) use of other medications   . Other B-complex deficiencies   . Memory loss   . Pain in limb   . Dizziness and giddiness   . Diastolic dysfunction    Past Surgical History  Procedure Laterality Date  . Pacemaker insertion      by Dr Olevia Perches for tachy/brady syndrome (MDT)  . Mastectomy    . Tonsillectomy    . Appendectomy    . Embolectomy Left 12/30/2012    Procedure: EMBOLECTOMY LEFT LEG, with patch angioplasty;  Surgeon: Serafina Mitchell, MD;  Location: [facility] OR;  Service: Vascular;  Laterality: Left;   Family History  Problem Relation Age of Onset  . Heart disease Maternal Grandfather   . Diabetes Sister    . Tuberculosis Mother     father  . Diabetes Brother   . Stroke Brother    History  Substance Use Topics  . Smoking status: Never Smoker   . Smokeless tobacco: Not on file  . Alcohol Use: No   OB History    No data available     Review of Systems  Constitutional: Negative for fever and appetite change.  Respiratory: Negative for cough, chest tightness and shortness of breath.   Cardiovascular: Negative for chest pain.  Gastrointestinal: Negative for nausea, vomiting and abdominal pain.  Genitourinary: Negative for frequency, vaginal bleeding and vaginal discharge.  Musculoskeletal: Positive for myalgias and arthralgias. Negative for neck pain.  Skin: Negative for rash.  Neurological: Negative for dizziness and headaches.  Hematological: Negative for adenopathy.  A complete 10 system review of systems was obtained and all systems are negative except as noted in the HPI and PMH.      Allergies  Review of patient's allergies indicates no known allergies.  Home Medications   Prior to Admission medications   Medication Sig Start Date End Date Taking? Authorizing Provider  calcium carbonate (OS-CAL) 600 MG TABS tablet Take 600 mg by mouth daily with breakfast.    Historical Provider, MD  Cyanocobalamin (VITAMIN B-12 CR) 1500 MCG TBCR Take 1,500 mcg by mouth daily.    Historical Provider, MD  diltiazem [facility]  XR) 180 MG 24 hr capsule Take 180 mg by mouth daily.    Historical Provider, MD  docusate sodium 100 MG CAPS Take 100 mg by mouth daily. 01/03/13   Barton Dubois, MD  donepezil (ARICEPT) 5 MG tablet Take 5 mg by mouth at bedtime.    Historical Provider, MD  escitalopram (LEXAPRO) 10 MG tablet Take 10 mg by mouth daily.    Historical Provider, MD  isosorbide-hydrALAZINE (BIDIL) 20-37.5 MG per tablet Take 1 tablet by mouth 2 (two) times daily. 01/03/13   Barton Dubois, MD  lansoprazole (PREVACID) 30 MG capsule Take 30 mg by mouth daily at 12 noon.    Historical Provider,  MD  levothyroxine (SYNTHROID, LEVOTHROID) 100 MCG tablet Take 100 mcg by mouth daily before breakfast.    Historical Provider, MD  loratadine (CLARITIN) 10 MG tablet Take 10 mg by mouth daily.    Historical Provider, MD  Multiple Vitamin (MULTIVITAMIN WITH MINERALS) TABS tablet Take 1 tablet by mouth daily.    Historical Provider, MD  polyethylene glycol (MIRALAX / GLYCOLAX) packet Take 17 g by mouth daily. 01/03/13   Barton Dubois, MD  Rivaroxaban (XARELTO) 20 MG TABS tablet Take 1 tablet (20 mg total) by mouth daily with supper. 01/25/13   Barton Dubois, MD  Vitamin D, Ergocalciferol, (DRISDOL) 50000 UNITS CAPS capsule Take 50,000 Units by mouth every 7 (seven) days. Sunday    Historical Provider, MD   BP 110/44 mmHg  Pulse 60  Temp(Src) 97.6 F (36.4 C)  Resp 11  Wt 137 lb 2 oz (62.2 kg)  SpO2 100% Physical Exam  Constitutional: She is oriented to person, place, and time. She appears well-developed and well-nourished. She appears distressed.  Uncomfortable  HENT:  Head: Normocephalic and atraumatic.  Mouth/Throat: Oropharynx is clear and moist. No oropharyngeal exudate.  Eyes: Conjunctivae and EOM are normal. Pupils are equal, round, and reactive to light.  Neck: Normal range of motion. Neck supple.  No meningismus.  Cardiovascular: Normal rate, regular rhythm, normal heart sounds and intact distal pulses.   No murmur heard. Pulmonary/Chest: Effort normal and breath sounds normal. No respiratory distress.  Abdominal: Soft. There is no tenderness. There is no rebound and no guarding.  Musculoskeletal: Normal range of motion. She exhibits no edema or tenderness.  Cyanotic right upper extremity with delayed capillary refill, no palpable or dopplerable radial pulse  Neurological: She is alert and oriented to person, place, and time. No cranial nerve deficit. She exhibits normal muscle tone. Coordination normal.  No ataxia on finger to nose bilaterally. No pronator drift. 5/5 strength  throughout. CN 2-12 intact. Negative Romberg. Equal grip strength. Sensation intact. Gait is normal.   Skin: Skin is warm.  Psychiatric: She has a normal mood and affect. Her behavior is normal.  Nursing note and vitals reviewed.   ED Course  Procedures (including critical care time) Labs Review Labs Reviewed  CBC WITH DIFFERENTIAL - Abnormal; Notable for the following:    Hemoglobin 11.9 (*)    Platelets 131 (*)    All other components within normal limits  BASIC METABOLIC PANEL - Abnormal; Notable for the following:    Glucose, Bld 106 (*)    GFR calc non Af Amer 44 (*)    GFR calc Af Amer 51 (*)    All other components within normal limits  PROTIME-INR - Abnormal; Notable for the following:    Prothrombin Time 15.7 (*)    All other components within normal limits  HEPARIN LEVEL (UNFRACTIONATED)  CBC  TYPE AND SCREEN  SURGICAL PATHOLOGY    Imaging Review No results found.   EKG Interpretation None      MDM   Final diagnoses:  Artery occlusion   Spoke with Dr. Oneida Alar on patient arrival. Heparin drip continued  from outside hospital.  Patient denies chest pain or shortness of breath. Dr. Oneida Alar to take patient to OR emergently for embolectomy. Son states patient is no longer on xarelto though it is listed on her medication list.  CRITICAL CARE Performed by: Ezequiel Essex Total critical care time: 30 Critical care time was exclusive of separately billable procedures and treating other patients. Critical care was necessary to treat or prevent imminent or life-threatening deterioration. Critical care was time spent personally by me on the following activities: development of treatment plan with patient and/or surrogate as well as nursing, discussions with consultants, evaluation of patient's response to treatment, examination of patient, obtaining history from patient or surrogate, ordering and performing treatments and interventions, ordering and review of  laboratory studies, ordering and review of radiographic studies, pulse oximetry and re-evaluation of patient's condition.   Ezequiel Essex, MD 02/06/14 [phone]

## 2014-02-05 NOTE — ED Notes (Signed)
OR ready for patient

## 2014-02-05 NOTE — Anesthesia Procedure Notes (Signed)
Procedure Name: Intubation Date/Time: 02/05/2014 8:52 PM Performed by: Valetta Fuller Pre-anesthesia Checklist: Patient identified, Emergency Drugs available, Suction available and Patient being monitored Patient Re-evaluated:Patient Re-evaluated prior to inductionOxygen Delivery Method: Circle system utilized Preoxygenation: Pre-oxygenation with 100% oxygen Intubation Type: IV induction, Rapid sequence and Cricoid Pressure applied Laryngoscope Size: Miller and 2 Grade View: Grade I Tube type: Oral Tube size: 7.5 mm Number of attempts: 1 Airway Equipment and Method: Stylet Placement Confirmation: ETT inserted through vocal cords under direct vision,  positive ETCO2 and breath sounds checked- equal and bilateral Secured at: 21 cm Tube secured with: Tape Dental Injury: Teeth and Oropharynx as per pre-operative assessment

## 2014-02-05 NOTE — Transfer of Care (Signed)
Immediate Anesthesia Transfer of Care Note  Patient: [name]  Procedure(s) Performed: Procedure(s): Right Brachial, Axillary, Ulnar and Radial Embolectomy (Right)  Patient Location: PACU  Anesthesia Type:General  Level of Consciousness: sedated and patient cooperative  Airway & Oxygen Therapy: Patient connected to nasal cannula oxygen  Post-op Assessment: Report given to PACU RN, Post -op Vital signs reviewed and stable and Patient moving all extremities X 4  Post vital signs: Reviewed and stable  Complications: No apparent anesthesia complications

## 2014-02-05 NOTE — Progress Notes (Signed)
ANTICOAGULATION CONSULT NOTE - Initial Consult  Pharmacy Consult for Heparin Indication: ischemia hand  No Known Allergies  Patient Measurements: Weight: 137 lb 2 oz (62.2 kg)  Vital Signs: Temp: 97.7 F (36.5 C) (12/28 2222) BP: 111/42 mmHg (12/28 2345) Pulse Rate: 60 (12/28 2345)  Labs:  Recent Labs  02/05/14 1919 02/05/14 1954  HGB 11.9*  --   HCT 37.0  --   PLT 131*  --   LABPROT  --  15.7*  INR  --  1.24  CREATININE 1.08  --     CrCl cannot be calculated (Unknown ideal weight.).   Medical History: Past Medical History  Diagnosis Date  . Sick sinus syndrome     s/p PPM by Dr Olevia Perches (MDT)  . Hypertension   . FHx: mastectomy     for breast cancer  . Persistent atrial fibrillation     on pradaxa  . Gastro - esophageal reflux disease   . Allergic rhinitis due to pollen   . Other specified acquired hypothyroidism   . Body mass index 28.0-28.9, adult   . Other and unspecified hyperlipidemia   . Encounter for long-term (current) use of other medications   . Other B-complex deficiencies   . Memory loss   . Pain in limb   . Dizziness and giddiness   . Diastolic dysfunction     Medications:  Prescriptions prior to admission  Medication Sig Dispense Refill Last Dose  . calcium carbonate (OS-CAL) 600 MG TABS tablet Take 600 mg by mouth daily with breakfast.   07/31/2013 at Unknown time  . Cyanocobalamin (VITAMIN B-12 CR) 1500 MCG TBCR Take 1,500 mcg by mouth daily.   07/31/2013 at Unknown time  . diltiazem (DILACOR XR) 180 MG 24 hr capsule Take 180 mg by mouth daily.   07/31/2013 at Unknown time  . docusate sodium 100 MG CAPS Take 100 mg by mouth daily. 10 capsule 0 07/31/2013 at Unknown time  . donepezil (ARICEPT) 5 MG tablet Take 5 mg by mouth at bedtime.   07/30/2013 at Unknown time  . escitalopram (LEXAPRO) 10 MG tablet Take 10 mg by mouth daily.   07/31/2013 at Unknown time  . isosorbide-hydrALAZINE (BIDIL) 20-37.5 MG per tablet Take 1 tablet by mouth 2 (two)  times daily.   07/31/2013 at Unknown time  . lansoprazole (PREVACID) 30 MG capsule Take 30 mg by mouth daily at 12 noon.   07/31/2013 at Unknown time  . levothyroxine (SYNTHROID, LEVOTHROID) 100 MCG tablet Take 100 mcg by mouth daily before breakfast.   07/31/2013 at Unknown time  . loratadine (CLARITIN) 10 MG tablet Take 10 mg by mouth daily.   07/31/2013 at Unknown time  . Multiple Vitamin (MULTIVITAMIN WITH MINERALS) TABS tablet Take 1 tablet by mouth daily.   07/31/2013 at Unknown time  . polyethylene glycol (MIRALAX / GLYCOLAX) packet Take 17 g by mouth daily. 14 each 0 Past Week at Unknown time  . Rivaroxaban (XARELTO) 20 MG TABS tablet Take 1 tablet (20 mg total) by mouth daily with supper. 30 tablet  07/31/2013 at Unknown time  . Vitamin D, Ergocalciferol, (DRISDOL) 50000 UNITS CAPS capsule Take 50,000 Units by mouth every 7 (seven) days. Sunday   07/30/2013 at Unknown time    Assessment: 80 yo female with h/o Afib s/p LUE embolectomy, for heparin,  Heparin 500 units/hr started at 2030.  Heparin 5000 units IV bolus given in OR ~2130  Goal of Therapy:  Heparin level 0.3-0.7 units/ml Monitor platelets by anticoagulation  protocol: Yes   Plan:  Increase Heparin 750 units/hr Check heparin level in 8 hours.   [name], Bronson Curb 02/05/2014,11:58 PM

## 2014-02-05 NOTE — ED Notes (Signed)
Unable to obtain radial pulse via doppler

## 2014-02-05 NOTE — Anesthesia Postprocedure Evaluation (Signed)
  Anesthesia Post-op Note  Patient: [name]  Procedure(s) Performed: Procedure(s): Right Brachial, Axillary, Ulnar and Radial Embolectomy (Right)  Patient Location: PACU  Anesthesia Type:General  Level of Consciousness: awake and sedated  Airway and Oxygen Therapy: Patient Spontanous Breathing  Post-op Pain: mild  Post-op Assessment: Post-op Vital signs reviewed  Post-op Vital Signs: stable  Last Vitals:  Filed Vitals:   02/05/14 2245  BP:   Pulse: 60  Temp:   Resp: 13    Complications: No apparent anesthesia complications

## 2014-02-05 NOTE — ED Notes (Signed)
Patient from [facility], patient was diagnosed with a complete right axillary occlusion. No palpable radial pulse. Patient reports pain x2 days. Hx of a-fib, blood clots and pacemaker. HR 106, BP 106/77 97% on RA

## 2014-02-05 NOTE — Op Note (Signed)
Procedure: Right Axillary Brachial ulnar and radial artery embolectomy  Preoperative diagnosis: Acute ischemia right hand  Postoperative diagnosis: Same  Anesthesia: Gen.  Assistant: Silva Bandy PA-C  Operative findings: #1 acute embolus brachial bifurcation and distal radial and ulnar arteries  Specimens: Embolic material  Operative details: After obtaining informed consent, the patient was taken to the operating room. The patient was placed in supine position on the operating room table. After induction of general anesthesia and placement of a laryngeal mask, the patient's entire right upper extremity was prepped and draped in the usual sterile fashion. Next, a longitudinal incision was made on the right arm at the level of the antecubital crease. The incision was carried down through the subcutaneous tissues down to the level of the bicipital aponeurosis. This was taken down with cautery. The brachial artery was dissected free circumferentially. There was visible thrombus within the brachial artery. Dissection was carried down to the bifurcation of the brachial artery. The radial and ulnar arteries were dissected free circumferentially and vessel loops were placed around these. The patient had been placed on a heparin drip preoperatively. She was given an additional bolus of 5000 units of intravenous heparin. After 2 minutes of circulation time, Vesseloops were used to control the proximal brachial artery as well as the radial and ulnar arteries. A transverse arteriotomy was made just above the brachial bifurcation. There was visible embolic material within the artery. This was removed under direct vision. A #3 Fogarty catheter was then passed proximally up the brachial and axillary artery with multiple passes to make sure that all embolic material was removed and there was brisk arterial inflow. 2 clean passes were obtained. This was then reoccluded with a vessel loop. I then proceeded to pass a #3  Fogarty catheter down the radial and ulnar arteries.  Small amounts of embolic material were removed from both of these. Catheters were passed down until the catheter was at least 30 cm and 2 clean passes were obtained. Each branch was then thoroughly flushed with heparinized saline. All embolic material was sent to pathology as specimen. The arteriotomy was then repaired using interrupted 7-0 Prolene sutures. At completion of the anastomosis everything was forebled backbled and thoroughly flushed. The anastomosis was secured.  Vesseloops were released and there was a palpable radial pulse immediately. There was biphasic radial Doppler flow. There is biphasic ulnar Doppler flow. There was good flow in the brachial artery. There was good Doppler flow in the proximal radial and ulnar arteries. At this point hemostasis was obtained. Subcutaneous tissues were reapproximated using running 3-0 Vicryl suture. The skin was closed with a 4 0 Vicryl subcuticular stitch. Dermabond was applied. The patient tolerated the procedure well and there were no complications. Instrument sponge and needle counts were correct at the end of the case. Patient was taken to recovery in stable condition.  Ruta Hinds, MD [facility] [place] Office: [phone] Pager: [phone]

## 2014-02-05 NOTE — Anesthesia Preprocedure Evaluation (Addendum)
Anesthesia Evaluation  Patient identified by MRN, date of birth, ID band Patient awake and Patient confused    Reviewed: Allergy & Precautions, H&P , NPO status , Patient's Chart, lab work & pertinent test results  Airway Mallampati: II  TM Distance: >3 FB Neck ROM: Full    Dental  (+) Edentulous Upper, Partial Lower   Pulmonary  breath sounds clear to auscultation        Cardiovascular hypertension, + Peripheral Vascular Disease + pacemaker Rhythm:Irregular Rate:Normal + Systolic murmurs    Neuro/Psych    GI/Hepatic   Endo/Other  Hypothyroidism   Renal/GU Renal disease     Musculoskeletal   Abdominal   Peds  Hematology   Anesthesia Other Findings   Reproductive/Obstetrics                            Anesthesia Physical Anesthesia Plan  ASA: III and emergent  Anesthesia Plan: General   Post-op Pain Management:    Induction: Intravenous, Rapid sequence and Cricoid pressure planned  Airway Management Planned: Oral ETT  Additional Equipment:   Intra-op Plan:   Post-operative Plan: Extubation in OR  Informed Consent:   Plan Discussed with: CRNA, Anesthesiologist and Surgeon  Anesthesia Plan Comments:         Anesthesia Quick Evaluation

## 2014-02-05 NOTE — H&P (Addendum)
[facility] [place] HISTORY AND PHYSICAL   History of Present Illness:  Patient is a 80 y.o. year old female who presents for evaluation of right hand pain.  Pt transferred from [facility].  Pt complains of pain in her right hand that started this morning.  It has slowly gotten worse with decreased sensation and weakness.  CT of right arm at [facility] showed axillary artery embolus.  She was transferred on heparin.  Pt has history of chronic afib.  She has had a prior renal embolus and left leg embolus (Dr Trula Slade 2014).  Her anticoagulation was stopped in October of this year after a fall with head injury and several week rehab stay according to son.  She denies chest pain or shortness of breath.  She has some dementia.  She has not really been very mobile since her d/c from Rehab several weeks ago.  Other medical problems include hypertension, hyperlipidemia, sick sinus with left side pacer.  These are all stable.  She is currently undergoing thyroid eval by her primary MD.  Although history lists Xarelto son says this was stopped after her fall.  Past Medical History  Diagnosis Date  . Sick sinus syndrome     s/p PPM by Dr Olevia Perches (MDT)  . Hypertension   . FHx: mastectomy     for breast cancer  . Persistent atrial fibrillation     on pradaxa  . Gastro - esophageal reflux disease   . Allergic rhinitis due to pollen   . Other specified acquired hypothyroidism   . Body mass index 28.0-28.9, adult   . Other and unspecified hyperlipidemia   . Encounter for long-term (current) use of other medications   . Other B-complex deficiencies   . Memory loss   . Pain in limb   . Dizziness and giddiness   . Diastolic dysfunction     Past Surgical History  Procedure Laterality Date  . Pacemaker insertion      by Dr Olevia Perches for tachy/brady syndrome (MDT)  . Mastectomy    . Tonsillectomy    . Appendectomy    . Embolectomy Left 12/30/2012    Procedure: EMBOLECTOMY LEFT  LEG, with patch angioplasty;  Surgeon: Serafina Mitchell, MD;  Location: [facility] OR;  Service: Vascular;  Laterality: Left;    Social History History  Substance Use Topics  . Smoking status: Never Smoker   . Smokeless tobacco: Not on file  . Alcohol Use: No    Family History Family History  Problem Relation Age of Onset  . Heart disease Maternal Grandfather   . Diabetes Sister   . Tuberculosis Mother     father  . Diabetes Brother   . Stroke Brother     Allergies  No Known Allergies   No current facility-administered medications for this encounter.   Current Outpatient Prescriptions  Medication Sig Dispense Refill  . calcium carbonate (OS-CAL) 600 MG TABS tablet Take 600 mg by mouth daily with breakfast.    . Cyanocobalamin (VITAMIN B-12 CR) 1500 MCG TBCR Take 1,500 mcg by mouth daily.    Marland Kitchen diltiazem (DILACOR XR) 180 MG 24 hr capsule Take 180 mg by mouth daily.    Marland Kitchen docusate sodium 100 MG CAPS Take 100 mg by mouth daily. 10 capsule 0  . donepezil (ARICEPT) 5 MG tablet Take 5 mg by mouth at bedtime.    Marland Kitchen escitalopram (LEXAPRO) 10 MG tablet Take 10 mg by mouth daily.    . isosorbide-hydrALAZINE (BIDIL)  20-37.5 MG per tablet Take 1 tablet by mouth 2 (two) times daily.    . lansoprazole (PREVACID) 30 MG capsule Take 30 mg by mouth daily at 12 noon.    Marland Kitchen levothyroxine (SYNTHROID, LEVOTHROID) 100 MCG tablet Take 100 mcg by mouth daily before breakfast.    . loratadine (CLARITIN) 10 MG tablet Take 10 mg by mouth daily.    . Multiple Vitamin (MULTIVITAMIN WITH MINERALS) TABS tablet Take 1 tablet by mouth daily.    . polyethylene glycol (MIRALAX / GLYCOLAX) packet Take 17 g by mouth daily. 14 each 0  . Rivaroxaban (XARELTO) 20 MG TABS tablet Take 1 tablet (20 mg total) by mouth daily with supper. 30 tablet   . Vitamin D, Ergocalciferol, (DRISDOL) 50000 UNITS CAPS capsule Take 50,000 Units by mouth every 7 (seven) days. Sunday      ROS:   General:  No weight loss, Fever,  chills  HEENT: No recent headaches, no nasal bleeding, no visual changes, no sore throat  Neurologic: No dizziness, blackouts, seizures. No recent symptoms of stroke or mini- stroke. No recent episodes of slurred speech, or temporary blindness.  Cardiac: No recent episodes of chest pain/pressure, no shortness of breath at rest.  No shortness of breath with exertion. + history of atrial fibrillation or irregular heartbeat  Vascular: No history of rest pain in feet.  No history of claudication.  No history of non-healing ulcer, No history of DVT   Pulmonary: No home oxygen, no productive cough, no hemoptysis,  No asthma or wheezing  Musculoskeletal:  [ ]  Arthritis, [ ]  Low back pain,  [ ]  Joint pain  Hematologic:No history of hypercoagulable state.  No history of easy bleeding.  No history of anemia  Gastrointestinal: No hematochezia or melena,  No gastroesophageal reflux, no trouble swallowing  Urinary: [ ]  chronic Kidney disease, [ ]  on HD - [ ]  MWF or [ ]  TTHS, [ ]  Burning with urination, [ ]  Frequent urination, [ ]  Difficulty urinating;   Skin: No rashes  Psychological: No history of anxiety,  No history of depression, some baseline confusion and memory loss   Physical Examination  Filed Vitals:   02/05/14 1930  BP: 161/78  Pulse: 81  Resp: 27  SpO2: 97%    There is no weight on file to calculate BMI.  General:  Alert and oriented, no acute distress HEENT: Normal Neck: No JVD Pulmonary: Clear to auscultation bilaterally Cardiac: Irregular rhythm Abdomen: Soft, non-tender, non-distended, no mass Skin: No rash, right hand pale cool compared to left Extremity Pulses:  2+ radial, brachial left side, absent brachial radial ulnar pulse right side, 2+ femoral bilaterally, 2+ dorsalis pedis right side, absent dp left side, absent posterior tibial pulses bilaterally, both feet pink symmetrically warm Musculoskeletal: No deformity or edema  Neurologic: Upper and lower extremity  motor 5/5 and symmetric with exception or right hand which has motor function but weaker than left  DATA:   CBC    Component Value Date/Time   WBC 8.2 02/05/2014 1919   RBC 4.26 02/05/2014 1919   HGB 11.9* 02/05/2014 1919   HCT 37.0 02/05/2014 1919   PLT 131* 02/05/2014 1919   MCV 86.9 02/05/2014 1919   MCH 27.9 02/05/2014 1919   MCHC 32.2 02/05/2014 1919   RDW 15.2 02/05/2014 1919   LYMPHSABS 3.3 02/05/2014 1919   MONOABS 0.5 02/05/2014 1919   EOSABS 0.2 02/05/2014 1919   BASOSABS 0.0 02/05/2014 1919    BMET    Component  Value Date/Time   NA 145 08/01/2013 0335   K 2.9* 08/01/2013 0335   CL 104 08/01/2013 0335   CO2 26 08/01/2013 0335   GLUCOSE 95 08/01/2013 0335   BUN 11 08/01/2013 0335   CREATININE 0.90 08/01/2013 0335   CALCIUM 9.7 08/01/2013 0335   GFRNONAA 55* 08/01/2013 0335   GFRAA 63* 08/01/2013 0335    EKG afib  Rate 82   ASSESSMENT:  Acute ischemia right arm, hypokalemia, UTI   PLAN:  1.  Continue heparin drip to OR for embolectomy.  Risks benefits procedure details discussed with pt and son.  Risk of myocardial events perioperative complications recurrent embolus discussed at length  2. UTI on cipro, continue full 7 day course.  3. Hypokalemia replete 4. afib continue rate control.  Will need to discuss use of anticoagulation postop  Ruta Hinds, MD [facility] [place] Office: [phone] Pager: [phone]   Ruta Hinds, MD [facility] [place]: [phone] Pager: [phone]

## 2014-02-06 ENCOUNTER — Encounter (HOSPITAL_COMMUNITY): Payer: Self-pay | Admitting: Vascular Surgery

## 2014-02-06 ENCOUNTER — Inpatient Hospital Stay (HOSPITAL_COMMUNITY): Payer: Commercial Managed Care - HMO

## 2014-02-06 DIAGNOSIS — I480 Paroxysmal atrial fibrillation: Secondary | ICD-10-CM

## 2014-02-06 LAB — URINALYSIS, ROUTINE W REFLEX MICROSCOPIC
BILIRUBIN URINE: NEGATIVE
Glucose, UA: NEGATIVE mg/dL
HGB URINE DIPSTICK: NEGATIVE
KETONES UR: 15 mg/dL — AB
Leukocytes, UA: NEGATIVE
Nitrite: NEGATIVE
PH: 7 (ref 5.0–8.0)
Protein, ur: NEGATIVE mg/dL
Specific Gravity, Urine: 1.034 — ABNORMAL HIGH (ref 1.005–1.030)
Urobilinogen, UA: 0.2 mg/dL (ref 0.0–1.0)

## 2014-02-06 LAB — CBC
HCT: 34.9 % — ABNORMAL LOW (ref 36.0–46.0)
HEMATOCRIT: 35 % — AB (ref 36.0–46.0)
HEMOGLOBIN: 10.9 g/dL — AB (ref 12.0–15.0)
Hemoglobin: 11 g/dL — ABNORMAL LOW (ref 12.0–15.0)
MCH: 27.3 pg (ref 26.0–34.0)
MCH: 27.8 pg (ref 26.0–34.0)
MCHC: 31.1 g/dL (ref 30.0–36.0)
MCHC: 31.5 g/dL (ref 30.0–36.0)
MCV: 87.5 fL (ref 78.0–100.0)
MCV: 88.1 fL (ref 78.0–100.0)
PLATELETS: 112 10*3/uL — AB (ref 150–400)
Platelets: 122 10*3/uL — ABNORMAL LOW (ref 150–400)
RBC: 4 MIL/uL (ref 3.87–5.11)
RBC: 3.96 MIL/uL (ref 3.87–5.11)
RDW: 15.3 % (ref 11.5–15.5)
RDW: 15.4 % (ref 11.5–15.5)
WBC: 7.4 10*3/uL (ref 4.0–10.5)
WBC: 5.4 10*3/uL (ref 4.0–10.5)

## 2014-02-06 LAB — BASIC METABOLIC PANEL
Anion gap: 7 (ref 5–15)
BUN: 9 mg/dL (ref 6–23)
CALCIUM: 9.6 mg/dL (ref 8.4–10.5)
CO2: 27 mmol/L (ref 19–32)
CREATININE: 0.9 mg/dL (ref 0.50–1.10)
Chloride: 105 mEq/L (ref 96–112)
GFR calc Af Amer: 63 mL/min — ABNORMAL LOW (ref >90)
GFR calc non Af Amer: 55 mL/min — ABNORMAL LOW (ref >90)
Glucose, Bld: 116 mg/dL — ABNORMAL HIGH (ref 70–99)
Potassium: 3.7 mmol/L (ref 3.5–5.1)
Sodium: 139 mmol/L (ref 135–145)

## 2014-02-06 LAB — MRSA PCR SCREENING: MRSA by PCR: NEGATIVE

## 2014-02-06 LAB — HEPARIN LEVEL (UNFRACTIONATED)
Heparin Unfractionated: 0.61 IU/mL (ref 0.30–0.70)
Heparin Unfractionated: 0.25 IU/mL — ABNORMAL LOW (ref 0.30–0.70)

## 2014-02-06 MED ORDER — DONEPEZIL HCL 5 MG PO TABS
5.0000 mg | ORAL_TABLET | Freq: Every day | ORAL | Status: DC
  Administered 2014-02-06 – 2014-02-07 (×2): 5 mg via ORAL
  Filled 2014-02-06 (×5): qty 1

## 2014-02-06 MED ORDER — ACETAMINOPHEN 650 MG RE SUPP
325.0000 mg | RECTAL | Status: DC | PRN
Start: 2014-02-06 — End: 2014-02-08

## 2014-02-06 MED ORDER — DILTIAZEM HCL ER 180 MG PO CP24
180.0000 mg | ORAL_CAPSULE | Freq: Every day | ORAL | Status: DC
  Administered 2014-02-06 – 2014-02-08 (×3): 180 mg via ORAL
  Filled 2014-02-06 (×3): qty 1

## 2014-02-06 MED ORDER — CETYLPYRIDINIUM CHLORIDE 0.05 % MT LIQD
7.0000 mL | Freq: Two times a day (BID) | OROMUCOSAL | Status: DC
  Administered 2014-02-06 – 2014-02-08 (×4): 7 mL via OROMUCOSAL

## 2014-02-06 MED ORDER — MAGNESIUM SULFATE 2 GM/50ML IV SOLN
2.0000 g | Freq: Every day | INTRAVENOUS | Status: DC | PRN
  Filled 2014-02-06: qty 50

## 2014-02-06 MED ORDER — ALUM & MAG HYDROXIDE-SIMETH 200-200-20 MG/5ML PO SUSP: 15.0000 mL | ORAL | Status: DC | PRN

## 2014-02-06 MED ORDER — DOCUSATE SODIUM 100 MG PO CAPS
100.0000 mg | ORAL_CAPSULE | Freq: Every day | ORAL | Status: DC
  Administered 2014-02-06 – 2014-02-08 (×3): 100 mg via ORAL
  Filled 2014-02-06 (×3): qty 1

## 2014-02-06 MED ORDER — POTASSIUM CHLORIDE CRYS ER 20 MEQ PO TBCR: 20.0000 meq | EXTENDED_RELEASE_TABLET | Freq: Every day | ORAL | Status: DC | PRN

## 2014-02-06 MED ORDER — POTASSIUM CHLORIDE CRYS ER 20 MEQ PO TBCR: 20.0000 meq | EXTENDED_RELEASE_TABLET | Freq: Once | ORAL | Status: DC

## 2014-02-06 MED ORDER — HEPARIN (PORCINE) IN NACL 100-0.45 UNIT/ML-% IJ SOLN
750.0000 [IU]/h | INTRAMUSCULAR | Status: DC
  Filled 2014-02-06: qty 250

## 2014-02-06 MED ORDER — PANTOPRAZOLE SODIUM 40 MG PO TBEC: 40.0000 mg | DELAYED_RELEASE_TABLET | Freq: Every day | ORAL | Status: DC

## 2014-02-06 MED ORDER — PHENOL 1.4 % MT LIQD: 1.0000 | OROMUCOSAL | Status: DC | PRN

## 2014-02-06 MED ORDER — ADULT MULTIVITAMIN W/MINERALS CH
1.0000 | ORAL_TABLET | Freq: Every day | ORAL | Status: DC
  Administered 2014-02-06 – 2014-02-08 (×3): 1 via ORAL
  Filled 2014-02-06 (×3): qty 1

## 2014-02-06 MED ORDER — HEPARIN (PORCINE) IN NACL 100-0.45 UNIT/ML-% IJ SOLN
750.0000 [IU]/h | INTRAMUSCULAR | Status: DC
  Administered 2014-02-06: 750 [IU]/h via INTRAVENOUS
  Filled 2014-02-06 (×2): qty 250

## 2014-02-06 MED ORDER — ONDANSETRON HCL 4 MG/2ML IJ SOLN: 4.0000 mg | Freq: Four times a day (QID) | INTRAMUSCULAR | Status: DC | PRN

## 2014-02-06 MED ORDER — LEVOTHYROXINE SODIUM 100 MCG PO TABS
100.0000 ug | ORAL_TABLET | Freq: Every day | ORAL | Status: DC
  Administered 2014-02-06 – 2014-02-08 (×3): 100 ug via ORAL
  Filled 2014-02-06 (×4): qty 1

## 2014-02-06 MED ORDER — DEXTROSE 5 % IV SOLN
1.5000 g | Freq: Two times a day (BID) | INTRAVENOUS | Status: AC
  Administered 2014-02-06 (×2): 1.5 g via INTRAVENOUS
  Filled 2014-02-06 (×2): qty 1.5

## 2014-02-06 MED ORDER — ISOSORB DINITRATE-HYDRALAZINE 20-37.5 MG PO TABS
1.0000 | ORAL_TABLET | Freq: Two times a day (BID) | ORAL | Status: DC
  Administered 2014-02-06 – 2014-02-08 (×5): 1 via ORAL
  Filled 2014-02-06 (×7): qty 1

## 2014-02-06 MED ORDER — POLYETHYLENE GLYCOL 3350 17 G PO PACK
17.0000 g | PACK | Freq: Every day | ORAL | Status: DC
  Administered 2014-02-06 – 2014-02-08 (×3): 17 g via ORAL
  Filled 2014-02-06 (×3): qty 1

## 2014-02-06 MED ORDER — PANTOPRAZOLE SODIUM 40 MG PO TBEC
40.0000 mg | DELAYED_RELEASE_TABLET | Freq: Every day | ORAL | Status: DC
  Administered 2014-02-06 – 2014-02-08 (×3): 40 mg via ORAL
  Filled 2014-02-06: qty 1

## 2014-02-06 MED ORDER — LORATADINE 10 MG PO TABS
10.0000 mg | ORAL_TABLET | Freq: Every day | ORAL | Status: DC
  Administered 2014-02-06 – 2014-02-08 (×3): 10 mg via ORAL
  Filled 2014-02-06 (×3): qty 1

## 2014-02-06 MED ORDER — HEPARIN (PORCINE) IN NACL 100-0.45 UNIT/ML-% IJ SOLN
650.0000 [IU]/h | INTRAMUSCULAR | Status: DC
  Filled 2014-02-06: qty 250

## 2014-02-06 MED ORDER — ACETAMINOPHEN 325 MG PO TABS: 325.0000 mg | ORAL_TABLET | ORAL | Status: DC | PRN

## 2014-02-06 MED ORDER — GUAIFENESIN-DM 100-10 MG/5ML PO SYRP: 15.0000 mL | ORAL_SOLUTION | ORAL | Status: DC | PRN

## 2014-02-06 MED ORDER — CYANOCOBALAMIN 500 MCG PO TABS
1500.0000 ug | ORAL_TABLET | Freq: Every day | ORAL | Status: DC
  Administered 2014-02-06 – 2014-02-08 (×3): 1500 ug via ORAL
  Filled 2014-02-06 (×3): qty 3

## 2014-02-06 MED ORDER — ESCITALOPRAM OXALATE 10 MG PO TABS
10.0000 mg | ORAL_TABLET | Freq: Every day | ORAL | Status: DC
  Administered 2014-02-06 – 2014-02-08 (×3): 10 mg via ORAL
  Filled 2014-02-06 (×3): qty 1

## 2014-02-06 MED ORDER — LORAZEPAM 1 MG PO TABS
1.0000 mg | ORAL_TABLET | Freq: Four times a day (QID) | ORAL | Status: DC | PRN
  Administered 2014-02-07 (×3): 1 mg via ORAL
  Filled 2014-02-06 (×3): qty 1

## 2014-02-06 MED ORDER — DEXTROSE 5 % IV SOLN: 1.5000 g | Freq: Once | INTRAVENOUS | Status: DC

## 2014-02-06 NOTE — Progress Notes (Signed)
@   0105 Paged and spoke with Dr. Oneida Alar regarding pt's bladder scan of >722mL. Order received to place Foley. 739mL clear, yellow urine returned.

## 2014-02-06 NOTE — Progress Notes (Signed)
ANTICOAGULATION CONSULT NOTE - Follow Up Consult  Pharmacy Consult:  Heparin Indication:  Afib + brachial/radial artery embolus s/p embolectomy  No Known Allergies  Patient Measurements: Height: 5\' 5"  (165.1 cm) Weight: 114 lb 3.2 oz (51.8 kg) IBW/kg (Calculated) : 57 Heparin Dosing Weight: 52 kg  Vital Signs: Temp: 97.8 F (36.6 C) (12/29 0400) Temp Source: Oral (12/29 0400) BP: 135/54 mmHg (12/29 0800) Pulse Rate: 60 (12/29 0800)  Labs:  Recent Labs  02/05/14 1919 02/05/14 1954 02/06/14 0115 02/06/14 0845  HGB 11.9*  --  10.9* 11.0*  HCT 37.0  --  35.0* 34.9*  PLT 131*  --  122* PENDING  LABPROT  --  15.7*  --   --   INR  --  1.24  --   --   HEPARINUNFRC  --   --   --  0.61  CREATININE 1.08  --  0.90  --     Estimated Creatinine Clearance: 34 mL/min (by C-G formula based on Cr of 0.9).     Assessment: 68 YOF with history of Afib and renal and left leg embolus in 2014 presented with brachial and radial artery embolus.  Patient was previously on Xarelto, but therapy has been discontinued since October s/p fall with subdural bleed.  Pharmacy consulted to manage IV heparin s/p embolectomy.  Heparin level therapeutic and toward the high end of normal.  Noted patient received 5000 units of heparin bolus in the OR prior to initiation of infusion.  No bleeding reported.  Aware patient has mild thrombocytopenia (today's platelet count is pending).   Goal of Therapy:  Heparin level 0.3-0.7 units/ml, aim low Monitor platelets by anticoagulation protocol: Yes    Plan:  - Decrease heparin gtt slightly to 650 units/hr - Check confirmatory HL - Daily HL / CBC - Monitor closely for bleed    Kiyoto Slomski D. Mina Marble, PharmD, BCPS Pager:  [phone] 02/06/2014, 9:56 AM

## 2014-02-06 NOTE — Consult Note (Signed)
CARDIOLOGY CONSULT NOTE  Patient ID: [name], MRN: [id number], DOB/AGE: Jul 30, 1921 80 y.o. Admit date: 02/05/2014 Date of Consult: 02/06/2014  Primary Physician: Wende Neighbors, MD Primary Cardiologist: Dr Lovena Le Referring Physician: Dr Oneida Alar  Chief Complaint: right hand pain  Reason for Consultation: Atrial fib - right hand ischemia from cardiac embolus  HPI: 80 year-old woman with atrial fibrillation. She was admitted with actue right hand ischemia 12/28 and required emergency embolectomy. This was her second acute embolic event, as she presented in 12/2012 with left foot ischemia and required a left leg embolectomy. She was also noted to have a left renal infarction and celiac thrombus at that time by CTA. The patient had previously been anticoagulated with pradaxa and more recently with Xarelto. However, in October 2015 she had a mechanical fall and traumatic subdural and subarachnoid hematoma. She was treated at [facility] and responded to conservative therapy. She did not require surgical evacuation. She's been off Xarelto since December 07, 2013.  The patient otherwise has been in her usual state of health. She denies chest pain, shortness of breath, or palpitations. She is unsteady on her feet, but hasn't fallen since October. She is now living with her Son in [place]. He makes sure she takes her medications properly.   Medical History:  Past Medical History  Diagnosis Date  . Sick sinus syndrome     s/p PPM by Dr Olevia Perches (MDT)  . Hypertension   . FHx: mastectomy     for breast cancer  . Persistent atrial fibrillation     on pradaxa  . Gastro - esophageal reflux disease   . Allergic rhinitis due to pollen   . Other specified acquired hypothyroidism   . Body mass index 28.0-28.9, adult   . Other and unspecified hyperlipidemia   . Encounter for long-term (current) use of other medications   . Other B-complex deficiencies   . Memory loss   . Pain in limb   .  Dizziness and giddiness   . Diastolic dysfunction       Surgical History:  Past Surgical History  Procedure Laterality Date  . Pacemaker insertion      by Dr Olevia Perches for tachy/brady syndrome (MDT)  . Mastectomy    . Tonsillectomy    . Appendectomy    . Embolectomy Left 12/30/2012    Procedure: EMBOLECTOMY LEFT LEG, with patch angioplasty;  Surgeon: Serafina Mitchell, MD;  Location: [facility] OR;  Service: Vascular;  Laterality: Left;     Home Meds: Prior to Admission medications   Medication Sig Start Date End Date Taking? Authorizing Provider  calcium carbonate (OS-CAL) 600 MG TABS tablet Take 600 mg by mouth daily with breakfast.    Historical Provider, MD  Cyanocobalamin (VITAMIN B-12 CR) 1500 MCG TBCR Take 1,500 mcg by mouth daily.    Historical Provider, MD  diltiazem (DILACOR XR) 180 MG 24 hr capsule Take 180 mg by mouth daily.    Historical Provider, MD  docusate sodium 100 MG CAPS Take 100 mg by mouth daily. 01/03/13   Barton Dubois, MD  donepezil (ARICEPT) 5 MG tablet Take 5 mg by mouth at bedtime.    Historical Provider, MD  escitalopram (LEXAPRO) 10 MG tablet Take 10 mg by mouth daily.    Historical Provider, MD  isosorbide-hydrALAZINE (BIDIL) 20-37.5 MG per tablet Take 1 tablet by mouth 2 (two) times daily. 01/03/13   Barton Dubois, MD  lansoprazole (PREVACID) 30 MG capsule Take 30 mg by mouth daily  at 12 noon.    Historical Provider, MD  levothyroxine (SYNTHROID, LEVOTHROID) 100 MCG tablet Take 100 mcg by mouth daily before breakfast.    Historical Provider, MD  loratadine (CLARITIN) 10 MG tablet Take 10 mg by mouth daily.    Historical Provider, MD  Multiple Vitamin (MULTIVITAMIN WITH MINERALS) TABS tablet Take 1 tablet by mouth daily.    Historical Provider, MD  polyethylene glycol (MIRALAX / GLYCOLAX) packet Take 17 g by mouth daily. 01/03/13   Barton Dubois, MD  Rivaroxaban (XARELTO) 20 MG TABS tablet Take 1 tablet (20 mg total) by mouth daily with supper. 01/25/13   Barton Dubois, MD  Vitamin D, Ergocalciferol, (DRISDOL) 50000 UNITS CAPS capsule Take 50,000 Units by mouth every 7 (seven) days. Sunday    Historical Provider, MD    Inpatient Medications:  . antiseptic oral rinse  7 mL Mouth Rinse BID  . cefUROXime (ZINACEF)  IV  1.5 g Intravenous Q12H  . cyanocobalamin  1,500 mcg Oral Daily  . diltiazem  180 mg Oral Daily  . docusate sodium  100 mg Oral Daily  . donepezil  5 mg Oral QHS  . escitalopram  10 mg Oral Daily  . isosorbide-hydrALAZINE  1 tablet Oral BID  . levothyroxine  100 mcg Oral QAC breakfast  . loratadine  10 mg Oral Daily  . multivitamin with minerals  1 tablet Oral Daily  . pantoprazole  40 mg Oral Daily  . polyethylene glycol  17 g Oral Daily   . sodium chloride 10 mL/hr at 02/06/14 0800  . DOPamine    . heparin      Allergies: No Known Allergies  History   Social History  . Marital Status: Widowed    Spouse Name: N/A    Number of Children: 2  . Years of Education: N/A   Occupational History  . Retired    Social History Main Topics  . Smoking status: Never Smoker   . Smokeless tobacco: Not on file  . Alcohol Use: No  . Drug Use: No  . Sexual Activity: Not on file   Other Topics Concern  . Not on file   Social History Narrative   Participates in a senior games.       Family History  Problem Relation Age of Onset  . Heart disease Maternal Grandfather   . Diabetes Sister   . Tuberculosis Mother     father  . Diabetes Brother   . Stroke Brother      Review of Systems: General: negative for chills, fever, night sweats or weight changes.  ENT: negative for rhinorrhea or epistaxis Cardiovascular: negative for chest pain, shortness of breath, dyspnea on exertion, orthopnea, palpitations, or paroxysmal nocturnal dyspnea. Positive for edema. Dermatological: negative for rash Respiratory: negative for cough or wheezing GI: negative for nausea, vomiting, diarrhea, bright red blood per rectum, melena, or  hematemesis GU: no hematuria, urgency, or frequency Neurologic: negative for visual changes, syncope, headache, or dizziness Heme: no easy bruising or bleeding Endo: negative for excessive thirst, thyroid disorder, or flushing Musculoskeletal: positive for joint pains, gait unsteadiness  All other systems reviewed and are otherwise negative except as noted above.  Physical Exam: Blood pressure 135/54, pulse 60, temperature 97.9 F (36.6 C), temperature source Oral, resp. rate 14, height 5\' 5"  (1.651 m), weight 114 lb 3.2 oz (51.8 kg), SpO2 97 %. Pt is alert and oriented, WD, WN, elderly woman in no distress. HEENT: normal Neck: JVP normal. Carotid upstrokes normal without  bruits. No thyromegaly. Lungs: equal expansion, clear bilaterally CV: Apex is discrete and nondisplaced, RRR without murmur or gallop Abd: soft, NT, +BS, no bruit, no hepatosplenomegaly Back: no CVA tenderness Ext: mild pretibial bilateral edema Skin: warm and dry without rash     Labs: No results for input(s): CKTOTAL, CKMB, TROPONINI in the last 72 hours. Lab Results  Component Value Date   WBC 5.4 02/06/2014   HGB 11.0* 02/06/2014   HCT 34.9* 02/06/2014   MCV 88.1 02/06/2014   PLT 112* 02/06/2014    Recent Labs Lab 02/06/14 0115  NA 139  K 3.7  CL 105  CO2 27  BUN 9  CREATININE 0.90  CALCIUM 9.6  GLUCOSE 116*   No results found for: CHOL, HDL, LDLCALC, TRIG No results found for: DDIMER  Radiology/Studies:  No results found.  EKG: AV seqential pacing  Cardiac Studies: 2D Echo pending  ASSESSMENT AND PLAN:  1. Acute right hand ischemia now s/p embolectomy 2. Atrial fibrillation 3. Hx traumatic subdural/subarachnoid hematoma  Difficult situation where decision-making boils down to risk/benefit of anticoagulation in this patient at exceedingly high risk of stroke/embolism after 2 major embolic events in the past year versus risk of bleeding now 8 weeks out from a traumatic  subdural/subarachnoid hematoma. She seems to have recovered fairly well from her CNS bleed and has not fallen since that time. I have reviewed extensive records from [facility] through [facility] including progress notes, discharge summary, and imaging studies. Recommendation for resumption of oral anticoagulant was left to the discretion of the patient's PCP. I cannot find anywhere in the records a specific recommendation on timing but would suspect that she now is 8 weeks out it should be safe to resume anticoagulation. I think she would be a good candidate for Eliquis 2.5 mg BID considering her age > 80 years and body weight < 60 kg.  Will check a Brain CT to make sure there are no ongoing contraindications to oral anticoagulation. If this shows resolving changes compared to her studies from [facility], would transition from IV heparin to Eliquis 2.5 mg BID. Discussed plan at length with patient and her Son who was at the bedside. Otherwise would continue her current cardiac medications and will await result of her 2D Echo.  Signed, Sherren Mocha MD, [facility] 02/06/2014, 12:22 PM

## 2014-02-06 NOTE — Progress Notes (Signed)
ANTICOAGULATION CONSULT NOTE - Follow Up Consult  Pharmacy Consult:  Heparin Indication:  Afib + brachial/radial artery embolus s/p embolectomy  No Known Allergies  Patient Measurements: Height: 5\' 5"  (165.1 cm) Weight: 114 lb 3.2 oz (51.8 kg) IBW/kg (Calculated) : 57 Heparin Dosing Weight: 52 kg  Vital Signs: Temp: 98.4 F (36.9 C) (12/29 1806) Temp Source: Oral (12/29 1806) BP: 137/72 mmHg (12/29 1806) Pulse Rate: 91 (12/29 1806)  Labs:  Recent Labs  02/05/14 1919 02/05/14 1954 02/06/14 0115 02/06/14 0845 02/06/14 2030  HGB 11.9*  --  10.9* 11.0*  --   HCT 37.0  --  35.0* 34.9*  --   PLT 131*  --  122* 112*  --   LABPROT  --  15.7*  --   --   --   INR  --  1.24  --   --   --   HEPARINUNFRC  --   --   --  0.61 0.25*  CREATININE 1.08  --  0.90  --   --     Estimated Creatinine Clearance: 34 mL/min (by C-G formula based on Cr of 0.9).     Assessment: 42 YOF with history of Afib and renal and left leg embolus in 2014 presented with brachial and radial artery embolus.  Patient was previously on Xarelto, but therapy has been discontinued since October s/p fall with subdural bleed.  Pharmacy consulted to manage IV heparin s/p embolectomy.    PM: Level came back low this PM.    Goal of Therapy:  Heparin level 0.3-0.7 units/ml, aim low Monitor platelets by anticoagulation protocol: Yes    Plan:   Increase heparin to 750 units/hr F/u with heparin level in AM  Onnie Boer, PharmD Pager: [phone] 02/06/2014 9:02 PM

## 2014-02-06 NOTE — Progress Notes (Addendum)
[facility]  Subjective  - hand feels better   Objective 120/53 61 97.8 F (36.6 C) (Oral) 14 96%  Intake/Output Summary (Last 24 hours) at 02/06/14 0747 Last data filed at 02/06/14 0700  Gross per 24 hour  Intake  707.5 ml  Output      0 ml  Net  707.5 ml   Small hematoma right arm incision, soft less than 3 cm 2+ radial pulse  On heparin  Assessment/Planning: Continue heparin drip ECHO today Will obtain input from cardiology regarding whether or not we will anticoagulate as outpt risk/benefit Urinary retention leave foley today and d/c in am  [name],[name] 02/06/2014 7:47 AM --  Laboratory Lab Results:  Recent Labs  02/05/14 1919 02/06/14 0115  WBC 8.2 7.4  HGB 11.9* 10.9*  HCT 37.0 35.0*  PLT 131* 122*   BMET  Recent Labs  02/05/14 1919 02/06/14 0115  NA 141 139  K 3.7 3.7  CL 105 105  CO2 22 27  GLUCOSE 106* 116*  BUN 9 9  CREATININE 1.08 0.90  CALCIUM 10.3 9.6    COAG Lab Results  Component Value Date   INR 1.24 02/05/2014   INR 1.38 12/29/2012   INR 2.1 03/11/2009   No results found for: PTT

## 2014-02-07 DIAGNOSIS — I999 Unspecified disorder of circulatory system: Secondary | ICD-10-CM

## 2014-02-07 DIAGNOSIS — I319 Disease of pericardium, unspecified: Secondary | ICD-10-CM

## 2014-02-07 DIAGNOSIS — I481 Persistent atrial fibrillation: Secondary | ICD-10-CM

## 2014-02-07 DIAGNOSIS — Z7901 Long term (current) use of anticoagulants: Secondary | ICD-10-CM

## 2014-02-07 MED ORDER — APIXABAN 2.5 MG PO TABS
2.5000 mg | ORAL_TABLET | Freq: Two times a day (BID) | ORAL | Status: DC
  Administered 2014-02-07 – 2014-02-08 (×3): 2.5 mg via ORAL
  Filled 2014-02-07 (×5): qty 1

## 2014-02-07 NOTE — Progress Notes (Addendum)
ANTICOAGULATION CONSULT NOTE - Follow Up Consult  Pharmacy Consult:  apixaban Indication:  Afib + brachial/radial artery embolus s/p embolectomy  No Known Allergies  Patient Measurements: Height: 5\' 5"  (165.1 cm) Weight: 114 lb 3.2 oz (51.8 kg) IBW/kg (Calculated) : 57 Heparin Dosing Weight: 52 kg  Vital Signs: Temp: 97.8 F (36.6 C) (12/30 0506) Temp Source: Oral (12/30 0506) BP: 137/77 mmHg (12/30 0506) Pulse Rate: 92 (12/30 0506)  Labs:  Recent Labs  02/05/14 1919 02/05/14 1954 02/06/14 0115 02/06/14 0845 02/06/14 2030  HGB 11.9*  --  10.9* 11.0*  --   HCT 37.0  --  35.0* 34.9*  --   PLT 131*  --  122* 112*  --   LABPROT  --  15.7*  --   --   --   INR  --  1.24  --   --   --   HEPARINUNFRC  --   --   --  0.61 0.25*  CREATININE 1.08  --  0.90  --   --     Estimated Creatinine Clearance: 34 mL/min (by C-G formula based on Cr of 0.9).  Assessment: 19 YOF with history of Afib and renal and left leg embolus in 2014 presented with brachial and radial artery embolus.  Patient was previously on Xarelto, but therapy has been discontinued since October s/p fall with subdural bleed. Now transitioning to apixaban. Cardiology recommending dose of 2.5mg  BID.  Goal of Therapy:  Therapeutic anticoagulation  Plan:  1. Apixaban 2.5mg  PO BID  *Pharmacy will sign-off and follow peripherally. Thank you for the consult!  Salome Arnt, PharmD, BCPS Pager # [phone] 02/07/2014 9:26 AM

## 2014-02-07 NOTE — Progress Notes (Signed)
Pt voided x2; incontinent. Francis Gaines Colonel Krauser RN.

## 2014-02-07 NOTE — Progress Notes (Addendum)
  [facility] Progress Note  02/07/2014 8:15 AM 2 Days Post-Op  Subjective: No complaints.    Filed Vitals:   02/07/14 0506  BP: 137/77  Pulse: 92  Temp: 97.8 F (36.6 C)  Resp: 18    Physical Exam: Incisions:  Right antecubital incision clean and intact. Small hematoma present.  Extremities:  2+ right radial pulse. Sensation and motor intact right hand.   CBC    Component Value Date/Time   WBC 5.4 02/06/2014 0845   RBC 3.96 02/06/2014 0845   HGB 11.0* 02/06/2014 0845   HCT 34.9* 02/06/2014 0845   PLT 112* 02/06/2014 0845   MCV 88.1 02/06/2014 0845   MCH 27.8 02/06/2014 0845   MCHC 31.5 02/06/2014 0845   RDW 15.4 02/06/2014 0845   LYMPHSABS 3.3 02/05/2014 1919   MONOABS 0.5 02/05/2014 1919   EOSABS 0.2 02/05/2014 1919   BASOSABS 0.0 02/05/2014 1919    BMET    Component Value Date/Time   NA 139 02/06/2014 0115   K 3.7 02/06/2014 0115   CL 105 02/06/2014 0115   CO2 27 02/06/2014 0115   GLUCOSE 116* 02/06/2014 0115   BUN 9 02/06/2014 0115   CREATININE 0.90 02/06/2014 0115   CALCIUM 9.6 02/06/2014 0115   GFRNONAA 55* 02/06/2014 0115   GFRAA 63* 02/06/2014 0115    INR    Component Value Date/Time   INR 1.24 02/05/2014 1954   INR 2.1 03/11/2009 1452     Intake/Output Summary (Last 24 hours) at 02/07/14 0815 Last data filed at 02/07/14 0507  Gross per 24 hour  Intake   17.5 ml  Output    875 ml  Net -857.5 ml     Assessment:  80 y.o. female is s/p:  Right axillary, brachial, ulnar and radial artery embolectomy  2 Days Post-Op  Plan: -2+ right radial pulse. Small hematoma around right antecubital incision stable.  -Cardiology ordered brain CT to evaluate for any contraindications to starting Eliquis. ECHO pending.  -Continue heparin.  -Urinary retention: d/c foley today.    Virgina Jock, PA-C [facility] Office: [phone] Pager: [phone] 02/07/2014 8:15 AM   Agree with above.  D/c home  when anticoagulation plan in place  Ruta Hinds, MD [facility] Office: [phone] Pager: [phone]

## 2014-02-07 NOTE — Progress Notes (Addendum)
Patient Name: [name] Date of Encounter: 02/07/2014  Active Problems:   Ischemia of hand   Ischemia of upper extremity   Primary Cardiologist: Dr Lovena Le  Patient Profile: 80 yo female w/ hx afib, prev on Xarelto, s/p fall w/ SAH/SDH, off Xarelto since 11/2013, admitted 12/28 w/ embolus to R hand s/p Right Axillary Brachial ulnar and radial artery embolectomy. Cards seeing for anticoagulation.  SUBJECTIVE: Incision is OK, no chest pain, slight SOB, but OK now.   OBJECTIVE Filed Vitals:   02/06/14 1644 02/06/14 1806 02/06/14 2129 02/07/14 0506  BP:  137/72 135/60 137/77  Pulse:  91 65 92  Temp: 98.7 F (37.1 C) 98.4 F (36.9 C) 98.2 F (36.8 C) 97.8 F (36.6 C)  TempSrc: Oral Oral Oral Oral  Resp:  16 18 18   Height:      Weight:      SpO2:  97% 98% 100%    Intake/Output Summary (Last 24 hours) at 02/07/14 0824 Last data filed at 02/07/14 0507  Gross per 24 hour  Intake   17.5 ml  Output    875 ml  Net -857.5 ml   Filed Weights   02/05/14 2100 02/06/14 0015  Weight: 137 lb 2 oz (62.2 kg) 114 lb 3.2 oz (51.8 kg)    PHYSICAL EXAM General: Well developed, well nourished, female in no acute distress. Head: Normocephalic, atraumatic.  Neck: Supple without bruits, JVD not elevated. Lungs:  Resp regular and unlabored, few rales bases. Heart: RRR, S1, S2, no S3, S4, 2/6 murmur; no rub. Abdomen: Soft, non-tender, non-distended, BS + x 4.  Extremities: No clubbing, cyanosis, no edema. Left arm bandaged and dressed, not disturbed. Neuro: Alert and oriented X 3. Moves all extremities spontaneously. Psych: Normal affect.  LABS: CBC: Recent Labs  02/05/14 1919 02/06/14 0115 02/06/14 0845  WBC 8.2 7.4 5.4  NEUTROABS 4.1  --   --   HGB 11.9* 10.9* 11.0*  HCT 37.0 35.0* 34.9*  MCV 86.9 87.5 88.1  PLT 131* 122* 112*   INR: Recent Labs  02/05/14 1954  INR 0.35   Basic Metabolic Panel: Recent Labs  02/05/14 1919 02/06/14 0115  NA 141 139  K 3.7  3.7  CL 105 105  CO2 22 27  GLUCOSE 106* 116*  BUN 9 9  CREATININE 1.08 0.90  CALCIUM 10.3 9.6    TELE:   V pacing or AV pacing.  Radiology/Studies: Ct Head Wo Contrast 02/06/2014   CLINICAL DATA:  80 year old with history of recent falls  EXAM: CT HEAD WITHOUT CONTRAST  TECHNIQUE: Contiguous axial images were obtained from the base of the skull through the vertex without intravenous contrast.  COMPARISON:  02/05/2014 and priors  FINDINGS: There is diffuse, confluent, periventricular white matter hypodensity, compatible with chronic small vessel white matter disease. No significant change from prior.  Mild cerebral atrophy is noted.  The gray-white differentiation is otherwise preserved.  There is no evidence of an acute infarct.  There is no acute intracranial hemorrhage.  There is no midline shift or mass effect.  There is no extra-axial fluid collection.  Polypoid mucosal thickening is noted along the medial wall of the left maxillary sinus. The remaining paranasal sinuses and mastoid air cells are aerated.  The orbits are intact.  Calvarial hyperostosis is noted.  IMPRESSION: 1. No acute intracranial process. 2. No evidence of a subdural hematoma. 3. Chronic small vessel white matter disease. 4. Mild cerebral atrophy.   Electronically Signed  By: Rosemarie Ax   On: 02/06/2014 15:51    Current Medications:  . antiseptic oral rinse  7 mL Mouth Rinse BID  . cyanocobalamin  1,500 mcg Oral Daily  . diltiazem  180 mg Oral Daily  . docusate sodium  100 mg Oral Daily  . donepezil  5 mg Oral QHS  . escitalopram  10 mg Oral Daily  . isosorbide-hydrALAZINE  1 tablet Oral BID  . levothyroxine  100 mcg Oral QAC breakfast  . loratadine  10 mg Oral Daily  . multivitamin with minerals  1 tablet Oral Daily  . pantoprazole  40 mg Oral Daily  . polyethylene glycol  17 g Oral Daily   . sodium chloride 75 mL/hr at 02/07/14 0255  . DOPamine    . heparin 750 Units/hr (02/06/14 2148)    ASSESSMENT  AND PLAN: Active Problems:   Ischemia of hand - per [facility]    Ischemia of upper extremity - per [facility]    Anticoagulation - see Dr. Burt Knack note 12/29. Recommendation is to switch from heparin to Eliquis 2.5 mg bid since CT head was negative for acute bleed and previous SAH/SDH have resolved. Dr. Oneida Alar has cleared to start, will order.    Hx CAD - No ongoing ischemic symptoms, echo pending   Signed, Rosaria Ferries , PA-C 8:24 AM 02/07/2014  Personally seen and examined. Agree with above. 1. Acute right hand ischemia now s/p embolectomy 2. Atrial fibrillation 3. Hx traumatic subdural/subarachnoid hematoma Eliquis Stop heparin tomorrow AM.  ECHO pending.  Likely DC tomorrow.  Reviewed Dr. Oneida Alar note.   Candee Furbish, MD

## 2014-02-07 NOTE — Progress Notes (Signed)
1145 pt foley removed. Francis Gaines Jansen Sciuto RN.

## 2014-02-07 NOTE — Progress Notes (Signed)
Echocardiogram 2D Echocardiogram has been performed.  Joelene Millin 02/07/2014, 11:52 AM

## 2014-02-07 NOTE — Progress Notes (Addendum)
[facility]  Subjective  - hand feels better   Objective 137/77 92 97.8 F (36.6 C) (Oral) 18 100%  Intake/Output Summary (Last 24 hours) at 02/07/14 0940 Last data filed at 02/07/14 0900  Gross per 24 hour  Intake    120 ml  Output    875 ml  Net   -755 ml   Right arm incision healing 2+ radial pulse  Assessment/Planning: Multiple embolic events at this point agree with Dr Burt Knack risk/benefit of anticoagulation and starting Eliquis Decrease IV fluid Heparin for now until Eliquis kicks in, d/c heparin am Echo pending D/c home when cardiology workup complete most likely tomorrow Plan discussed with pt and her son  Elam Dutch 02/07/2014 9:40 AM --  Laboratory Lab Results:  Recent Labs  02/06/14 0115 02/06/14 0845  WBC 7.4 5.4  HGB 10.9* 11.0*  HCT 35.0* 34.9*  PLT 122* 112*   BMET  Recent Labs  02/05/14 1919 02/06/14 0115  NA 141 139  K 3.7 3.7  CL 105 105  CO2 22 27  GLUCOSE 106* 116*  BUN 9 9  CREATININE 1.08 0.90  CALCIUM 10.3 9.6    COAG Lab Results  Component Value Date   INR 1.24 02/05/2014   INR 1.38 12/29/2012   INR 2.1 03/11/2009   No results found for: PTT

## 2014-02-07 NOTE — Progress Notes (Signed)
Pt Eliquis administered as ordered; pt heparin drip stopped as ordered. Francis Gaines Charmon Thorson RN.

## 2014-02-08 ENCOUNTER — Encounter (HOSPITAL_COMMUNITY): Payer: Self-pay | Admitting: Physician Assistant

## 2014-02-08 ENCOUNTER — Telehealth: Payer: Self-pay | Admitting: Vascular Surgery

## 2014-02-08 DIAGNOSIS — Z950 Presence of cardiac pacemaker: Secondary | ICD-10-CM

## 2014-02-08 DIAGNOSIS — I998 Other disorder of circulatory system: Secondary | ICD-10-CM

## 2014-02-08 DIAGNOSIS — Z7901 Long term (current) use of anticoagulants: Secondary | ICD-10-CM

## 2014-02-08 LAB — CBC
HEMATOCRIT: 32.8 % — AB (ref 36.0–46.0)
HEMOGLOBIN: 10.4 g/dL — AB (ref 12.0–15.0)
MCH: 27.8 pg (ref 26.0–34.0)
MCHC: 31.7 g/dL (ref 30.0–36.0)
MCV: 87.7 fL (ref 78.0–100.0)
Platelets: 121 10*3/uL — ABNORMAL LOW (ref 150–400)
RBC: 3.74 MIL/uL — AB (ref 3.87–5.11)
RDW: 15.6 % — ABNORMAL HIGH (ref 11.5–15.5)
WBC: 3.8 10*3/uL — AB (ref 4.0–10.5)

## 2014-02-08 MED ORDER — APIXABAN 2.5 MG PO TABS: 2.5000 mg | ORAL_TABLET | Freq: Two times a day (BID) | ORAL | Status: DC

## 2014-02-08 MED ORDER — OXYCODONE HCL 5 MG PO TABS: 2.5000 mg | ORAL_TABLET | Freq: Three times a day (TID) | ORAL | Status: DC | PRN

## 2014-02-08 MED ORDER — SODIUM CHLORIDE 0.9 % IV SOLN: INTRAVENOUS | Status: DC

## 2014-02-08 NOTE — Progress Notes (Signed)
Patient Name: [name] Date of Encounter: 02/08/2014  Principal Problem:   Ischemia of hand Active Problems:   Atrial fibrillation   Pacemaker-[employer]   Ischemia of upper extremity   Chronic anticoagulation - now on Eliquis   Primary Cardiologist: Dr Lovena Le  Patient Profile: 80 yo female w/ hx afib, prev on Xarelto, s/p fall w/ SAH/SDH, off Xarelto since 11/2013, admitted 12/28 w/ embolus to R hand s/p Right Axillary Brachial ulnar and radial artery embolectomy. Cards seeing for anticoagulation.  SUBJECTIVE: Incision is OK, no chest pain, slight SOB, but OK now.   OBJECTIVE Filed Vitals:   02/07/14 1033 02/07/14 1334 02/07/14 2208 02/08/14 0550  BP: 152/55 141/60 141/66 135/57  Pulse: 87 61 60 60  Temp: 98.3 F (36.8 C) 98.8 F (37.1 C) 98.6 F (37 C) 98.2 F (36.8 C)  TempSrc: Oral Oral Oral Oral  Resp: 20 18 18 18   Height:      Weight:      SpO2: 99% 98% 98% 97%    Intake/Output Summary (Last 24 hours) at 02/08/14 1751 Last data filed at 02/07/14 1700  Gross per 24 hour  Intake    240 ml  Output   1100 ml  Net   -860 ml   Filed Weights   02/05/14 2100 02/06/14 0015  Weight: 137 lb 2 oz (62.2 kg) 114 lb 3.2 oz (51.8 kg)    PHYSICAL EXAM General: Well developed, well nourished, female in no acute distress. Head: Normocephalic, atraumatic.  Neck: Supple without bruits, JVD not elevated. Lungs:  Resp regular and unlabored, few rales bases. Heart: RRR, S1, S2, no S3, S4, 2/6 murmur; no rub. Abdomen: Soft, non-tender, non-distended, BS + x 4.  Extremities: No clubbing, cyanosis, no edema. Left arm bandaged and dressed, not disturbed. Neuro: Alert and oriented X 3. Moves all extremities spontaneously. Psych: Normal affect.  LABS: CBC: Recent Labs  02/05/14 1919  02/06/14 0845 02/08/14 0335  WBC 8.2  < > 5.4 3.8*  NEUTROABS 4.1  --   --   --   HGB 11.9*  < > 11.0* 10.4*  HCT 37.0  < > 34.9* 32.8*  MCV 86.9  < > 88.1 87.7  PLT 131*  < >  112* 121*  < > = values in this interval not displayed. INR:  Recent Labs  02/05/14 1954  INR 0.25   Basic Metabolic Panel:  Recent Labs  02/05/14 1919 02/06/14 0115  NA 141 139  K 3.7 3.7  CL 105 105  CO2 22 27  GLUCOSE 106* 116*  BUN 9 9  CREATININE 1.08 0.90  CALCIUM 10.3 9.6    TELE:   V pacing or AV pacing.  Radiology/Studies: Ct Head Wo Contrast 02/06/2014   CLINICAL DATA:  80 year old with history of recent falls  EXAM: CT HEAD WITHOUT CONTRAST  TECHNIQUE: Contiguous axial images were obtained from the base of the skull through the vertex without intravenous contrast.  COMPARISON:  02/05/2014 and priors  FINDINGS: There is diffuse, confluent, periventricular white matter hypodensity, compatible with chronic small vessel white matter disease. No significant change from prior.  Mild cerebral atrophy is noted.  The gray-white differentiation is otherwise preserved.  There is no evidence of an acute infarct.  There is no acute intracranial hemorrhage.  There is no midline shift or mass effect.  There is no extra-axial fluid collection.  Polypoid mucosal thickening is noted along the medial wall of the left maxillary sinus. The remaining paranasal  sinuses and mastoid air cells are aerated.  The orbits are intact.  Calvarial hyperostosis is noted.  IMPRESSION: 1. No acute intracranial process. 2. No evidence of a subdural hematoma. 3. Chronic small vessel white matter disease. 4. Mild cerebral atrophy.   Electronically Signed   By: Rosemarie Ax   On: 02/06/2014 15:51    Current Medications:  . antiseptic oral rinse  7 mL Mouth Rinse BID  . apixaban  2.5 mg Oral BID  . cyanocobalamin  1,500 mcg Oral Daily  . diltiazem  180 mg Oral Daily  . docusate sodium  100 mg Oral Daily  . donepezil  5 mg Oral QHS  . escitalopram  10 mg Oral Daily  . isosorbide-hydrALAZINE  1 tablet Oral BID  . levothyroxine  100 mcg Oral QAC breakfast  . loratadine  10 mg Oral Daily  . multivitamin  with minerals  1 tablet Oral Daily  . pantoprazole  40 mg Oral Daily  . polyethylene glycol  17 g Oral Daily   . sodium chloride    . DOPamine      ASSESSMENT AND PLAN: Active Problems:   Ischemia of hand - per [facility]    Ischemia of upper extremity - per [facility]    Anticoagulation - see Dr. Burt Knack note 12/29. Recommendation is to switch from heparin to Eliquis 2.5 mg bid since CT head was negative for acute bleed and previous SAH/SDH have resolved. Dr. Oneida Alar has cleared to start, will order.    Hx CAD - No ongoing ischemic symptoms, echo pending   Signed, Jasiah Elsen , PA-C 9:28 AM 02/08/2014  Personally seen and examined. Agree with above. 1. Acute right hand ischemia now s/p embolectomy 2. Atrial fibrillation/pacemaker 3. Hx traumatic subdural/subarachnoid hematoma Eliquis ECHO reassuring, normal EF DC today.  Reviewed Dr. Oneida Alar note.   Candee Furbish, MD

## 2014-02-08 NOTE — Progress Notes (Signed)
Pt discharge education and instructions completed with pt and sons at bedside. All voices understanding and denies any questions. Pt IV and telemetry removed. Pt handed her prescription for eliquis; Pt discharge home with sons to transport her home. Pt transported off unit via wheelchair with belongings and sons at bedside. Francis Gaines Tylia Ewell RN.

## 2014-02-08 NOTE — Progress Notes (Signed)
Medicare Important Message given? YES  (If response is "NO", the following Medicare IM given date fields will be blank)  Date Medicare IM given: 02/08/14 Medicare IM given by:  Temekia Caskey  

## 2014-02-08 NOTE — Telephone Encounter (Addendum)
-----   Message from Mena Goes, RN sent at 02/08/2014  9:47 AM EST ----- Regarding: Schedule   ----- Message -----    From: Gabriel Earing, PA-C    Sent: 02/08/2014   7:51 AM      To: [facility] Charge Pool  S/p right radial, ulnar, and brachial embolectomy.  F/u with Dr. Oneida Alar in 2 weeks.  Thanks, Samantha  notified patient of post op appt. on 02-22-14 at 9am with dr. fields on 02-22-14

## 2014-02-08 NOTE — Progress Notes (Signed)
Physical Therapy Evaluation Patient Details Name: [name] MRN: [id number] DOB: [birth year] Today's Date: 02/08/2014   History of Present Illness  80 yo female w/ hx afib, prev on Xarelto, s/p fall w/ SAH/SDH, off Xarelto since 11/2013, admitted 12/28 w/ embolus to R hand s/p Right Axillary Brachial ulnar and radial artery embolectomy.   Clinical Impression  Patient is seen following the above procedure and presents with functional limitations due to the deficits listed below (see PT Problem List). Complains of Lt heel pain in weight bearing which is tender to palpation, symptoms consistent with possible bone spur vs plantar fasciitis. This pain caused her antalgic gait pattern requiring mod assist to prevent fall at one point. Min assist for balance loss to posterior with static standing and gait, even with support from a rolling walker. Pt is at a high risk for falls.Patient will benefit from skilled PT to increase their independence and safety with mobility to allow discharge to the venue listed below.       Follow Up Recommendations SNF;Supervision/Assistance - 24 hour    Equipment Recommendations  None recommended by PT    Recommendations for Other Services       Precautions / Restrictions Precautions Precautions: Fall Restrictions Weight Bearing Restrictions: No      Mobility  Bed Mobility Overal bed mobility: Needs Assistance Bed Mobility: Rolling;Sidelying to Sit Rolling: Min assist Sidelying to sit: Min assist       General bed mobility comments: Min assist for rolling onto rt side and for truncal support into seated position. VC for technique.  Transfers Overall transfer level: Needs assistance Equipment used: Rolling walker (2 wheeled) Transfers: Sit to/from Stand Sit to Stand: Min assist         General transfer comment: Min assist for boost and balance. Leans heavily to posterior requiring assist to prevent fall. Cues for anterior weight shift with  UEs through RW. Performed x2 from lowest bed setting.  Ambulation/Gait Ambulation/Gait assistance: Mod assist Ambulation Distance (Feet): 75 Feet Assistive device: Rolling walker (2 wheeled) Gait Pattern/deviations: Step-through pattern;Decreased stride length;Decreased stance time - left;Antalgic;Narrow base of support;Shuffle Gait velocity: decreased   General Gait Details: Educated on safe DME use with rolling walker. Pt required mod assist at one point to prevent fall due to left leg buckling, reporting her left heel was painful. Several other instances of buckling but pt able to self correct. Min assist for balance due to posterior lean, and assist for walker control intermittently. VC for increased stride length.  Stairs            Wheelchair Mobility    Modified Rankin (Stroke Patients Only)       Balance Overall balance assessment: Needs assistance;History of Falls Sitting-balance support: No upper extremity supported;Feet supported Sitting balance-Leahy Scale: Fair     Standing balance support: Bilateral upper extremity supported Standing balance-Leahy Scale: Poor                               Pertinent Vitals/Pain Pain Assessment: No/denies pain    Home Living Family/patient expects to be discharged to:: Private residence Living Arrangements: Children Available Help at Discharge: Family;Available 24 hours/day Type of Home: Mobile home Home Access: Ramped entrance     Home Layout: One level Home Equipment: [place] - 2 wheels;Walker - 4 wheels;Shower seat;Wheelchair - manual      Prior Function Level of Independence: Independent with assistive device(s)  Comments: Walker for ambulation. States could bath/dress herself     Hand Dominance   Dominant Hand: Right    Extremity/Trunk Assessment   Upper Extremity Assessment: Defer to OT evaluation           Lower Extremity Assessment: Generalized weakness;LLE  deficits/detail   LLE Deficits / Details: Significant Rt heel pain in weight bearing. Tender to palpation of inferior calcaneus.      Communication   Communication: No difficulties  Cognition Arousal/Alertness: Awake/alert Behavior During Therapy: WFL for tasks assessed/performed Overall Cognitive Status: No family/caregiver present to determine baseline cognitive functioning (Hx of memory loss in H&H)                      General Comments General comments (skin integrity, edema, etc.): Pt with urinary incontinence in bed noted at start of therapy. Complains of left heel pain in weight bearing.    Exercises General Exercises - Lower Extremity Long Arc Quad: Strengthening;Both;10 reps;Seated      Assessment/Plan    PT Assessment Patient needs continued PT services  PT Diagnosis Difficulty walking;Abnormality of gait;Generalized weakness;Acute pain   PT Problem List Decreased strength;Decreased activity tolerance;Decreased balance;Decreased mobility;Decreased cognition;Decreased knowledge of use of DME;Decreased knowledge of precautions;Decreased safety awareness;Pain  PT Treatment Interventions DME instruction;Gait training;Functional mobility training;Therapeutic activities;Therapeutic exercise;Balance training;Neuromuscular re-education;Cognitive remediation;Patient/family education;Modalities   PT Goals (Current goals can be found in the Care Plan section) Acute Rehab PT Goals Patient Stated Goal: Get better again so I can live alone PT Goal Formulation: With patient Time For Goal Achievement: 02/22/14 Potential to Achieve Goals: Good    Frequency Min 3X/week   Barriers to discharge Decreased caregiver support Per RN, son works during the day. Pt states has a second son that has had surgery for throat cancer.    Co-evaluation               End of Session Equipment Utilized During Treatment: Gait belt Activity Tolerance: Patient limited by pain Patient  left: in bed;with call bell/phone within reach;with nursing/sitter in room Nurse Communication: Mobility status;Precautions         Time: 0940-1010 PT Time Calculation (min) (ACUTE ONLY): 30 min   Charges:   PT Evaluation $Initial PT Evaluation Tier I: 1 Procedure PT Treatments $Gait Training: 8-22 mins   PT G CodesEllouise Newer 02/08/2014, 11:01 AM Elayne Snare, [place]

## 2014-02-08 NOTE — Care Management Note (Signed)
    Page 1 of 1   02/08/2014     3:56:29 PM CARE MANAGEMENT NOTE 02/08/2014  Patient:  [name], [name]   Account Number:  [id number]  Date Initiated:  02/08/2014  Documentation initiated by:  Marvetta Gibbons  Subjective/Objective Assessment:   Pt admitted with hand ischemia     Action/Plan:   PTA pt lived at home with sons   Anticipated DC Date:  02/08/2014   Anticipated DC Plan:  [place]  CM consult      [facility] Choice  Resumption Of Svcs/PTA Provider   Choice offered to / List presented to:  C-1 Patient        [place] arranged  [place] PT      [facility] agency  [place]   Status of service:  Completed, signed off Medicare Important Message given?  YES (If response is "NO", the following Medicare IM given date fields will be blank) Date Medicare IM given:  02/08/2014 Medicare IM given by:  Marvetta Gibbons Date Additional Medicare IM given:   Additional Medicare IM given by:    Discharge Disposition:  [place]  Per UR Regulation:  Reviewed for med. necessity/level of care/duration of stay  If discussed at [place] of Stay Meetings, dates discussed:    Comments:  02/08/14- 1400- Marvetta Gibbons RN, BSN [phone] Pt for d/c home today with HH-PT= spoke with pt and sons at bedside- per conversation pt is active with [facility]- call made to [facility]- and had to leave message regarding referral and verification of services- awaiting return call- will fax order when returned call received [phone]- spoke with intake at [facility]- confirmed that pt is active with them- orders sent via epic to [phone]- HH-PT will be resumed

## 2014-02-08 NOTE — Consult Note (Signed)
Received referral from inpatient RNCM. Visited the patient and her sons Richardson Landry and Jori Moll, at bedside to offer [place] Management services as benefit of  [employer]. Patient agreeable to services and will receive post hospital discharge call and will be evaluated for monthly home visits. Information packet given and copy of signed consent.  Of note, [facility] Care Management services does not replace or interfere with any services that are arranged by inpatient case management or social work. For additional questions or referrals please contact, Natividad Brood, RN BSN CCM, [facility] Liaison at [phone].

## 2014-02-08 NOTE — Progress Notes (Addendum)
Progress Note    02/08/2014 7:26 AM 3 Days Post-Op  Subjective:  Pt sleeping-awakes easily from voice.  When asked, she states her pain is much better in her right hand than when she came in.  afebrile HR 60's-80's 712'W-580'D systolic 98% RA  Filed Vitals:   02/08/14 0550  BP: 135/57  Pulse: 60  Temp: 98.2 F (36.8 C)  Resp: 18    Physical Exam: Cardiac:  regular Lungs:  Non labored Incisions:  C/d/i Extremities:  3+ right radial pulse; sensation is in tact Neuro:  Disoriented to place, but oriented to year  CBC    Component Value Date/Time   WBC 3.8* 02/08/2014 0335   RBC 3.74* 02/08/2014 0335   HGB 10.4* 02/08/2014 0335   HCT 32.8* 02/08/2014 0335   PLT 121* 02/08/2014 0335   MCV 87.7 02/08/2014 0335   MCH 27.8 02/08/2014 0335   MCHC 31.7 02/08/2014 0335   RDW 15.6* 02/08/2014 0335   LYMPHSABS 3.3 02/05/2014 1919   MONOABS 0.5 02/05/2014 1919   EOSABS 0.2 02/05/2014 1919   BASOSABS 0.0 02/05/2014 1919    BMET    Component Value Date/Time   NA 139 02/06/2014 0115   K 3.7 02/06/2014 0115   CL 105 02/06/2014 0115   CO2 27 02/06/2014 0115   GLUCOSE 116* 02/06/2014 0115   BUN 9 02/06/2014 0115   CREATININE 0.90 02/06/2014 0115   CALCIUM 9.6 02/06/2014 0115   GFRNONAA 55* 02/06/2014 0115   GFRAA 63* 02/06/2014 0115    INR    Component Value Date/Time   INR 1.24 02/05/2014 1954   INR 2.1 03/11/2009 1452     Intake/Output Summary (Last 24 hours) at 02/08/14 0726 Last data filed at 02/07/14 1700  Gross per 24 hour  Intake    360 ml  Output   1100 ml  Net   -740 ml   2D Echo 02/07/14: LV EF: 55% -  60%  ------------------------------------------------------------------- Indications:   Atrial fibrillation - 427.31. Chest pain 786.51. Sick sinus syndrome 427.81.  ------------------------------------------------------------------- History:  PMH:  Congestive heart  failure.  ------------------------------------------------------------------- Study Conclusions  - Procedure narrative: Transthoracic echocardiography. Image quality was fair. The study was technically difficult, as a result of poor patient compliance. - Left ventricle: The cavity size was normal. Wall thickness was increased in a pattern of mild LVH. Systolic function was normal. The estimated ejection fraction was in the range of 55% to 60%. Septal-lateral dyssynchrony likely from RV pacing. Indeterminant diastolic function. - Aortic valve: There was no stenosis. - Mitral valve: Mildly to moderately calcified annulus. There was trivial regurgitation. - Left atrium: The atrium was mildly dilated. - Right ventricle: The cavity size was normal. Pacer wire or catheter noted in right ventricle. Systolic function was normal. - Pulmonary arteries: PA peak pressure: 32 mm Hg (S). - Pericardium, extracardiac: Mild to moderate pericardial effusion, most prominent inferolaterally.  Impressions:  - Echo limited, patient was uncooperative.   Assessment:  80 y.o. female is s/p:  Right Axillary Brachial ulnar and radial artery embolectomy  3 Days Post-Op  Plan: -easily palpable right radial pulse -DVT prophylaxis:  Eliquis started yesterday and heparin d/c'd -cardiology to see pt today-will determine if pt will be discharged after that -pt lives at home with her son.   Leontine Locket, PA-C [facility] [phone] 02/08/2014 7:26 AM    D/c home today. PT consult to assess home health needs  Ruta Hinds, MD [facility] [place] Office:  [phone] Pager: [phone]

## 2014-02-08 NOTE — Progress Notes (Signed)
Patient Name: [name] Date of Encounter: 02/08/2014  Principal Problem:   Ischemia of hand Active Problems:   Atrial fibrillation   Pacemaker-[employer]   Ischemia of upper extremity   Chronic anticoagulation - now on Eliquis   Primary Cardiologist: Dr Lovena Le  Patient Profile: 80 yo female w/ hx afib, prev on Xarelto, s/p fall w/ SAH/SDH, off Xarelto since 11/2013, admitted 12/28 w/ embolus to R hand s/p Right Axillary Brachial ulnar and radial artery embolectomy. Cards seeing for anticoagulation.  SUBJECTIVE: Feels she is doing OK  OBJECTIVE Filed Vitals:   02/07/14 1033 02/07/14 1334 02/07/14 2208 02/08/14 0550  BP: 152/55 141/60 141/66 135/57  Pulse: 87 61 60 60  Temp: 98.3 F (36.8 C) 98.8 F (37.1 C) 98.6 F (37 C) 98.2 F (36.8 C)  TempSrc: Oral Oral Oral Oral  Resp: 20 18 18 18   Height:      Weight:      SpO2: 99% 98% 98% 97%    Intake/Output Summary (Last 24 hours) at 02/08/14 0905 Last data filed at 02/07/14 1700  Gross per 24 hour  Intake    240 ml  Output   1100 ml  Net   -860 ml   Filed Weights   02/05/14 2100 02/06/14 0015  Weight: 137 lb 2 oz (62.2 kg) 114 lb 3.2 oz (51.8 kg)    PHYSICAL EXAM General: Well developed, well nourished, female in no acute distress. Head: Normocephalic, atraumatic.  Neck: Supple without bruits, JVD not elevated. Lungs:  Resp regular and unlabored, few rales. Heart: RRR, S1, S2, no S3, S4, 2/6 murmur; no rub. Abdomen: Soft, non-tender, non-distended, BS + x 4.  Extremities: No clubbing, cyanosis, no edema.  Neuro: Alert and oriented X 2. Moves all extremities spontaneously. Psych: Normal affect.  LABS: CBC: Recent Labs  02/05/14 1919  02/06/14 0845 02/08/14 0335  WBC 8.2  < > 5.4 3.8*  NEUTROABS 4.1  --   --   --   HGB 11.9*  < > 11.0* 10.4*  HCT 37.0  < > 34.9* 32.8*  MCV 86.9  < > 88.1 87.7  PLT 131*  < > 112* 121*  < > = values in this interval not displayed. INR:  Recent Labs  02/05/14 1954  INR 9.21   Basic Metabolic Panel:  Recent Labs  02/05/14 1919 02/06/14 0115  NA 141 139  K 3.7 3.7  CL 105 105  CO2 22 27  GLUCOSE 106* 116*  BUN 9 9  CREATININE 1.08 0.90  CALCIUM 10.3 9.6   TELE: V pacing  ECHO: 02/07/2014 Study Conclusions - Procedure narrative: Transthoracic echocardiography. Image quality was fair. The study was technically difficult, as a result of poor patient compliance. - Left ventricle: The cavity size was normal. Wall thickness was increased in a pattern of mild LVH. Systolic function was normal. The estimated ejection fraction was in the range of 55% to 60%. Septal-lateral dyssynchrony likely from RV pacing. Indeterminant diastolic function. - Aortic valve: There was no stenosis. - Mitral valve: Mildly to moderately calcified annulus. There was trivial regurgitation. - Left atrium: The atrium was mildly dilated. - Right ventricle: The cavity size was normal. Pacer wire or catheter noted in right ventricle. Systolic function was normal. - Pulmonary arteries: PA peak pressure: 32 mm Hg (S). - Pericardium, extracardiac: Mild to moderate pericardial effusion, most prominent inferolaterally. Impressions: - Echo limited, patient was uncooperative.  Radiology/Studies: Ct Head Wo Contrast  02/06/2014   CLINICAL DATA:  80 year old with history of recent falls  EXAM: CT HEAD WITHOUT CONTRAST  TECHNIQUE: Contiguous axial images were obtained from the base of the skull through the vertex without intravenous contrast.  COMPARISON:  02/05/2014 and priors  FINDINGS: There is diffuse, confluent, periventricular white matter hypodensity, compatible with chronic small vessel white matter disease. No significant change from prior.  Mild cerebral atrophy is noted.  The gray-white differentiation is otherwise preserved.  There is no evidence of an acute infarct.  There is no acute intracranial hemorrhage.  There is no midline  shift or mass effect.  There is no extra-axial fluid collection.  Polypoid mucosal thickening is noted along the medial wall of the left maxillary sinus. The remaining paranasal sinuses and mastoid air cells are aerated.  The orbits are intact.  Calvarial hyperostosis is noted.  IMPRESSION: 1. No acute intracranial process. 2. No evidence of a subdural hematoma. 3. Chronic small vessel white matter disease. 4. Mild cerebral atrophy.   Electronically Signed   By: Rosemarie Ax   On: 02/06/2014 15:51    Current Medications:  . antiseptic oral rinse  7 mL Mouth Rinse BID  . apixaban  2.5 mg Oral BID  . cyanocobalamin  1,500 mcg Oral Daily  . diltiazem  180 mg Oral Daily  . docusate sodium  100 mg Oral Daily  . donepezil  5 mg Oral QHS  . escitalopram  10 mg Oral Daily  . isosorbide-hydrALAZINE  1 tablet Oral BID  . levothyroxine  100 mcg Oral QAC breakfast  . loratadine  10 mg Oral Daily  . multivitamin with minerals  1 tablet Oral Daily  . pantoprazole  40 mg Oral Daily  . polyethylene glycol  17 g Oral Daily   . sodium chloride    . DOPamine      ASSESSMENT AND PLAN: Active Problems:  Ischemia of hand - per VVS   Ischemia of upper extremity - per VVS   Anticoagulation - see Dr. Burt Knack note 12/29. Switched from heparin to Eliquis 2.5 mg bid 12/30 since CT head was negative for acute bleed and previous SAH/SDH have resolved. Dr. Oneida Alar cleared to start.   Hx CAD - No ongoing ischemic symptoms, echo w/ nl EF, PAS 32, results above, no further workup  Signed, Rosaria Ferries , PA-C 9:05 AM 02/08/2014  Personally seen and examined. Agree with above. See my other note as well.  Candee Furbish, MD

## 2014-02-08 NOTE — Discharge Summary (Signed)
Discharge Summary    [name] Mar 01, 1921 80 y.o. female  [id number]  Admission Date: 02/05/2014  Discharge Date: 02/08/14  Physician: Elam Dutch, MD  Admission Diagnosis: Artery occlusion [I74.9]   HPI:   This is a 80 y.o. female who presents for evaluation of right hand pain. Pt transferred from [facility]. Pt complains of pain in her right hand that started this morning. It has slowly gotten worse with decreased sensation and weakness. CT of right arm at [facility] showed axillary artery embolus. She was transferred on heparin. Pt has history of chronic afib. She has had a prior renal embolus and left leg embolus (Dr Trula Slade 2014). Her anticoagulation was stopped in October of this year after a fall with head injury and several week rehab stay according to son. She denies chest pain or shortness of breath. She has some dementia. She has not really been very mobile since her d/c from Rehab several weeks ago. Other medical problems include hypertension, hyperlipidemia, sick sinus with left side pacer. These are all stable. She is currently undergoing thyroid eval by her primary MD. Although history lists Xarelto son says this was stopped after her fall. Hospital Course:  The patient was admitted to the hospital and taken to the operating room on 02/05/2014 and underwent: Right Axillary Brachial ulnar and radial artery embolectomy    The pt tolerated the procedure well and was transported to the PACU in good condition.   She was placed on a heparin gtt.  She did have a small hematoma in the right arm incision that ws soft and less than 3cm.  She had a 2+ radial pulse.  She was having some urinary retention and therefore, the foley was left in for that day.  After it was removed, she was able to void.  A cardiology consult was obtained as she has afib and ischemic right hand and this is her 2nd acute embolic event as she presented in 2014 with left foot  ischemia.The pt was previously anticoagulated with Pradaxa and more recently with Xarelto, however, she had a mechanical fall and traumatic subdural and SAH.  She has been off her Xarelto since October 2015.    Dr. Burt Knack saw the pt.  Difficult situation where decision-making boils down to risk/benefit of anticoagulation in this patient at exceedingly high risk of stroke/embolism after 2 major embolic events in the past year versus risk of bleeding now 8 weeks out from a traumatic subdural/subarachnoid hematoma. She seems to have recovered fairly well from her CNS bleed and has not fallen since that time. I have reviewed extensive records from [facility] through [place] including progress notes, discharge summary, and imaging studies. Recommendation for resumption of oral anticoagulant was left to the discretion of the patient's PCP. I cannot find anywhere in the records a specific recommendation on timing but would suspect that she now is 8 weeks out it should be safe to resume anticoagulation. I think she would be a good candidate for Eliquis 2.5 mg BID considering her age > 80 years and body weight < 60 kg. Will check a Brain CT to make sure there are no ongoing contraindications to oral anticoagulation. If this shows resolving changes compared to her studies from [facility], would transition from IV heparin to Eliquis 2.5 mg BID. Discussed plan at length with patient and her Son who was at the bedside. Otherwise would continue her current cardiac medications and will await result of her 2D Echo.  Her 2D echo  results are as follows: Study Conclusions  - Procedure narrative: Transthoracic echocardiography. Image quality was fair. The study was technically difficult, as a result of poor patient compliance. - Left ventricle: The cavity size was normal. Wall thickness was increased in a pattern of mild LVH. Systolic function was normal. The estimated ejection fraction was in the range of 55%  to 60%. Septal-lateral dyssynchrony likely from RV pacing. Indeterminant diastolic function. - Aortic valve: There was no stenosis. - Mitral valve: Mildly to moderately calcified annulus. There was trivial regurgitation. - Left atrium: The atrium was mildly dilated. - Right ventricle: The cavity size was normal. Pacer wire or catheter noted in right ventricle. Systolic function was normal. - Pulmonary arteries: PA peak pressure: 32 mm Hg (S). - Pericardium, extracardiac: Mild to moderate pericardial effusion, most prominent inferolaterally.  Impressions:  - Echo limited, patient was uncooperative. Her heparin was discontinued when the Eliquis was started.   CBC    Component Value Date/Time   WBC 3.8* 02/08/2014 0335   RBC 3.74* 02/08/2014 0335   HGB 10.4* 02/08/2014 0335   HCT 32.8* 02/08/2014 0335   PLT 121* 02/08/2014 0335   MCV 87.7 02/08/2014 0335   MCH 27.8 02/08/2014 0335   MCHC 31.7 02/08/2014 0335   RDW 15.6* 02/08/2014 0335   LYMPHSABS 3.3 02/05/2014 1919   MONOABS 0.5 02/05/2014 1919   EOSABS 0.2 02/05/2014 1919   BASOSABS 0.0 02/05/2014 1919    BMET    Component Value Date/Time   NA 139 02/06/2014 0115   K 3.7 02/06/2014 0115   CL 105 02/06/2014 0115   CO2 27 02/06/2014 0115   GLUCOSE 116* 02/06/2014 0115   BUN 9 02/06/2014 0115   CREATININE 0.90 02/06/2014 0115   CALCIUM 9.6 02/06/2014 0115   GFRNONAA 55* 02/06/2014 0115   GFRAA 63* 02/06/2014 0115     Discharge Instructions:   The patient is discharged with extensive instructions on wound care and progressive ambulation.  They are instructed not to drive or perform any heavy lifting until returning to see the physician in his office.  Discharge Instructions    Call MD for:  redness, tenderness, or signs of infection (pain, swelling, bleeding, redness, odor or green/yellow discharge around incision site)    Complete by:  As directed      Call MD for:  severe or increased pain, loss or  decreased feeling  in affected limb(s)    Complete by:  As directed      Call MD for:  temperature >100.5    Complete by:  As directed      Discharge wound care:    Complete by:  As directed   Shower daily with soap and water starting 02/09/14     Driving Restrictions    Complete by:  As directed   No driving for 2 weeks     Lifting restrictions    Complete by:  As directed   No lifting for 4 weeks     Resume previous diet    Complete by:  As directed            Discharge Diagnosis:  Artery occlusion [I74.9]  Secondary Diagnosis: Patient Active Problem List   Diagnosis Date Noted  . Ischemia of hand 02/05/2014  . Ischemia of upper extremity 02/05/2014  . Peripheral vascular disease, unspecified 02/13/2013  . 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 Pacemaker-Medtronic 01/08/2012  . Dysphagia  10/14/2010  . Acute on chronic diastolic heart failure 41/74/0814  . CHEST PAIN UNSPECIFIED 03/07/2010  . BEN HTN HEART DISEASE WITHOUT HEART FAIL 09/10/2009  . ATAXIA AS LATE EFFECT OF CEREBROVASCULAR DISEASE 07/26/2009  . FATIGUE / MALAISE 07/26/2009  . ABNORMALITY OF GAIT 07/26/2009  . HYPERTENSION, BENIGN 10/16/2008  . CHEST PAIN-PAINFUL RESPIRATIONS 10/16/2008  . EDEMA 05/01/2008  . ATRIAL FIBRILLATION 01/29/2008  . Sick sinus syndrome 01/29/2008   Past Medical History  Diagnosis Date  . Sick sinus syndrome     s/p PPM by Dr Olevia Perches (MDT)  . Hypertension   . FHx: mastectomy     for breast cancer  . Persistent atrial fibrillation     on pradaxa  . Gastro - esophageal reflux disease   . Allergic rhinitis due to pollen   . Other specified acquired hypothyroidism   . Body mass index 28.0-28.9, adult   . Other and unspecified hyperlipidemia   . Encounter for long-term (current) use of other medications   . Other B-complex deficiencies   . Memory loss   . Pain in limb   . Dizziness and  giddiness   . Diastolic dysfunction        Medication List    STOP taking these medications        diltiazem 180 MG 24 hr capsule  Commonly known as:  DILACOR XR     donepezil 5 MG tablet  Commonly known as:  ARICEPT     rivaroxaban 20 MG Tabs tablet  Commonly known as:  XARELTO      TAKE these medications        apixaban 2.5 MG Tabs tablet  Commonly known as:  ELIQUIS  Take 1 tablet (2.5 mg total) by mouth 2 (two) times daily.     calcium carbonate 600 MG Tabs tablet  Commonly known as:  OS-CAL  Take 600 mg by mouth daily with breakfast.     diltiazem 180 MG 24 hr capsule  Commonly known as:  TIAZAC  Take 180 mg by mouth daily.     DSS 100 MG Caps  Take 100 mg by mouth daily.     escitalopram 10 MG tablet  Commonly known as:  LEXAPRO  Take 10 mg by mouth daily.     isosorbide-hydrALAZINE 20-37.5 MG per tablet  Commonly known as:  BIDIL  Take 1 tablet by mouth 2 (two) times daily.     lansoprazole 30 MG capsule  Commonly known as:  PREVACID  Take 30 mg by mouth daily at 12 noon.     levETIRAcetam 500 MG tablet  Commonly known as:  KEPPRA  Take 500 mg by mouth 2 (two) times daily.     levothyroxine 88 MCG tablet  Commonly known as:  SYNTHROID, LEVOTHROID  Take 88 mcg by mouth daily before breakfast.     loratadine 10 MG tablet  Commonly known as:  CLARITIN  Take 10 mg by mouth daily.     mirtazapine 15 MG tablet  Commonly known as:  REMERON  Take 15 mg by mouth at bedtime.     multivitamin with minerals Tabs tablet  Take 1 tablet by mouth daily.     oxyCODONE 5 MG immediate release tablet  Commonly known as:  ROXICODONE  Take 0.5 tablets (2.5 mg total) by mouth every 8 (eight) hours as needed.     polyethylene glycol packet  Commonly known as:  MIRALAX / GLYCOLAX  Take 17 g by mouth daily.  senna-docusate 8.6-50 MG per tablet  Commonly known as:  Senokot-S  Take 1 tablet by mouth.     Vitamin B-12 CR 1500 MCG Tbcr  Take 1,500 mcg by  mouth daily.        Roxicodone #20 No Refill  Disposition: home  Patient's condition: is Good  Follow up: 1. Dr. Oneida Alar in 2 weeks   Leontine Locket, PA-C [facility] [phone] 02/08/2014  7:52 AM

## 2014-02-08 NOTE — Progress Notes (Signed)
Physical Therapy Note  Nurse notified PT that patient is returning home. Continue to recommend SNF due to patient being at high risk for falls. If patient is to return home with family she would benefit from maximal support services at home, including home health physical therapy.  [address] [place], [place]

## 2014-02-10 DIAGNOSIS — M6250 Muscle wasting and atrophy, not elsewhere classified, unspecified site: Secondary | ICD-10-CM | POA: Diagnosis not present

## 2014-02-10 DIAGNOSIS — I5030 Unspecified diastolic (congestive) heart failure: Secondary | ICD-10-CM | POA: Diagnosis not present

## 2014-02-11 DIAGNOSIS — M545 Low back pain: Secondary | ICD-10-CM | POA: Diagnosis not present

## 2014-02-11 DIAGNOSIS — M6281 Muscle weakness (generalized): Secondary | ICD-10-CM | POA: Diagnosis not present

## 2014-02-11 DIAGNOSIS — R262 Difficulty in walking, not elsewhere classified: Secondary | ICD-10-CM | POA: Diagnosis not present

## 2014-02-13 DIAGNOSIS — M6281 Muscle weakness (generalized): Secondary | ICD-10-CM | POA: Diagnosis not present

## 2014-02-13 DIAGNOSIS — R262 Difficulty in walking, not elsewhere classified: Secondary | ICD-10-CM | POA: Diagnosis not present

## 2014-02-13 DIAGNOSIS — M545 Low back pain: Secondary | ICD-10-CM | POA: Diagnosis not present

## 2014-02-14 DIAGNOSIS — M6281 Muscle weakness (generalized): Secondary | ICD-10-CM | POA: Diagnosis not present

## 2014-02-14 DIAGNOSIS — R262 Difficulty in walking, not elsewhere classified: Secondary | ICD-10-CM | POA: Diagnosis not present

## 2014-02-14 DIAGNOSIS — M545 Low back pain: Secondary | ICD-10-CM | POA: Diagnosis not present

## 2014-02-15 DIAGNOSIS — I10 Essential (primary) hypertension: Secondary | ICD-10-CM | POA: Diagnosis not present

## 2014-02-15 DIAGNOSIS — I4891 Unspecified atrial fibrillation: Secondary | ICD-10-CM | POA: Diagnosis not present

## 2014-02-15 DIAGNOSIS — M6281 Muscle weakness (generalized): Secondary | ICD-10-CM | POA: Diagnosis not present

## 2014-02-15 DIAGNOSIS — M545 Low back pain: Secondary | ICD-10-CM | POA: Diagnosis not present

## 2014-02-15 DIAGNOSIS — E038 Other specified hypothyroidism: Secondary | ICD-10-CM | POA: Diagnosis not present

## 2014-02-15 DIAGNOSIS — J301 Allergic rhinitis due to pollen: Secondary | ICD-10-CM | POA: Diagnosis not present

## 2014-02-15 DIAGNOSIS — R262 Difficulty in walking, not elsewhere classified: Secondary | ICD-10-CM | POA: Diagnosis not present

## 2014-02-15 DIAGNOSIS — K219 Gastro-esophageal reflux disease without esophagitis: Secondary | ICD-10-CM | POA: Diagnosis not present

## 2014-02-15 DIAGNOSIS — R413 Other amnesia: Secondary | ICD-10-CM | POA: Diagnosis not present

## 2014-02-15 DIAGNOSIS — F329 Major depressive disorder, single episode, unspecified: Secondary | ICD-10-CM | POA: Diagnosis not present

## 2014-02-20 DIAGNOSIS — M545 Low back pain: Secondary | ICD-10-CM | POA: Diagnosis not present

## 2014-02-20 DIAGNOSIS — R262 Difficulty in walking, not elsewhere classified: Secondary | ICD-10-CM | POA: Diagnosis not present

## 2014-02-20 DIAGNOSIS — M6281 Muscle weakness (generalized): Secondary | ICD-10-CM | POA: Diagnosis not present

## 2014-02-21 DIAGNOSIS — R262 Difficulty in walking, not elsewhere classified: Secondary | ICD-10-CM | POA: Diagnosis not present

## 2014-02-21 DIAGNOSIS — M545 Low back pain: Secondary | ICD-10-CM | POA: Diagnosis not present

## 2014-02-21 DIAGNOSIS — I509 Heart failure, unspecified: Secondary | ICD-10-CM | POA: Diagnosis not present

## 2014-02-21 DIAGNOSIS — M6281 Muscle weakness (generalized): Secondary | ICD-10-CM | POA: Diagnosis not present

## 2014-02-22 ENCOUNTER — Encounter: Payer: Medicare Other | Admitting: Vascular Surgery

## 2014-02-22 DIAGNOSIS — M545 Low back pain: Secondary | ICD-10-CM | POA: Diagnosis not present

## 2014-02-22 DIAGNOSIS — R262 Difficulty in walking, not elsewhere classified: Secondary | ICD-10-CM | POA: Diagnosis not present

## 2014-02-22 DIAGNOSIS — M6281 Muscle weakness (generalized): Secondary | ICD-10-CM | POA: Diagnosis not present

## 2014-02-26 DIAGNOSIS — M6281 Muscle weakness (generalized): Secondary | ICD-10-CM | POA: Diagnosis not present

## 2014-02-26 DIAGNOSIS — R262 Difficulty in walking, not elsewhere classified: Secondary | ICD-10-CM | POA: Diagnosis not present

## 2014-02-26 DIAGNOSIS — M545 Low back pain: Secondary | ICD-10-CM | POA: Diagnosis not present

## 2014-02-27 DIAGNOSIS — R262 Difficulty in walking, not elsewhere classified: Secondary | ICD-10-CM | POA: Diagnosis not present

## 2014-02-27 DIAGNOSIS — M545 Low back pain: Secondary | ICD-10-CM | POA: Diagnosis not present

## 2014-02-27 DIAGNOSIS — M6281 Muscle weakness (generalized): Secondary | ICD-10-CM | POA: Diagnosis not present

## 2014-02-28 ENCOUNTER — Encounter: Payer: Self-pay | Admitting: Vascular Surgery

## 2014-02-28 DIAGNOSIS — R262 Difficulty in walking, not elsewhere classified: Secondary | ICD-10-CM | POA: Diagnosis not present

## 2014-02-28 DIAGNOSIS — M6281 Muscle weakness (generalized): Secondary | ICD-10-CM | POA: Diagnosis not present

## 2014-02-28 DIAGNOSIS — M545 Low back pain: Secondary | ICD-10-CM | POA: Diagnosis not present

## 2014-03-01 ENCOUNTER — Encounter: Payer: Self-pay | Admitting: Vascular Surgery

## 2014-03-01 ENCOUNTER — Ambulatory Visit (INDEPENDENT_AMBULATORY_CARE_PROVIDER_SITE_OTHER): Payer: Self-pay | Admitting: Vascular Surgery

## 2014-03-01 VITALS — BP 143/69 | HR 79 | Ht 65.0 in | Wt 113.0 lb

## 2014-03-01 DIAGNOSIS — M545 Low back pain: Secondary | ICD-10-CM | POA: Diagnosis not present

## 2014-03-01 DIAGNOSIS — M6281 Muscle weakness (generalized): Secondary | ICD-10-CM | POA: Diagnosis not present

## 2014-03-01 DIAGNOSIS — I742 Embolism and thrombosis of arteries of the upper extremities: Secondary | ICD-10-CM

## 2014-03-01 DIAGNOSIS — R262 Difficulty in walking, not elsewhere classified: Secondary | ICD-10-CM | POA: Diagnosis not present

## 2014-03-01 NOTE — Progress Notes (Signed)
Patient is a 81 year old female who is status post right brachial axillary ulnar radial artery embolectomy on December 28. She returns today for follow-up. She is currently on Eliquis for anticoagulation. She states she still has some weakness in her right hand but is overall improved. She has also previously had an embolectomy of her leg by Dr. Trula Slade in 2014.  Past Surgical History  Procedure Laterality Date  . Pacemaker insertion      by Dr Olevia Perches for tachy/brady syndrome (MDT)  . Mastectomy    . Tonsillectomy    . Appendectomy    . Embolectomy Left 12/30/2012    Procedure: EMBOLECTOMY LEFT LEG, with patch angioplasty;  Surgeon: Serafina Mitchell, MD;  Location: [facility] OR;  Service: Vascular;  Laterality: Left;  . Embolectomy Right 02/05/2014    Procedure: Right Brachial, Axillary, Ulnar and Radial Embolectomy;  Surgeon: Elam Dutch, MD;  Location[facility] OR;  Service: Vascular;  Laterality: Right;     Current Outpatient Prescriptions on File Prior to Visit  Medication Sig Dispense Refill  . apixaban (ELIQUIS) 2.5 MG TABS tablet Take 1 tablet (2.5 mg total) by mouth 2 (two) times daily. 60 tablet 3  . calcium carbonate (OS-CAL) 600 MG TABS tablet Take 600 mg by mouth daily with breakfast.    . Cyanocobalamin (VITAMIN B-12 CR) 1500 MCG TBCR Take 1,500 mcg by mouth daily.    Marland Kitchen diltiazem (TIAZAC) 180 MG 24 hr capsule Take 180 mg by mouth daily.    Marland Kitchen docusate sodium 100 MG CAPS Take 100 mg by mouth daily. 10 capsule 0  . escitalopram (LEXAPRO) 10 MG tablet Take 10 mg by mouth daily.    . isosorbide-hydrALAZINE (BIDIL) 20-37.5 MG per tablet Take 1 tablet by mouth 2 (two) times daily.    . lansoprazole (PREVACID) 30 MG capsule Take 30 mg by mouth daily at 12 noon.    . levETIRAcetam (KEPPRA) 500 MG tablet Take 500 mg by mouth 2 (two) times daily.  3  . levothyroxine (SYNTHROID, LEVOTHROID) 88 MCG tablet Take 88 mcg by mouth daily before breakfast.     . loratadine (CLARITIN) 10 MG tablet Take 10  mg by mouth daily.    . mirtazapine (REMERON) 15 MG tablet Take 15 mg by mouth at bedtime.  0  . Multiple Vitamin (MULTIVITAMIN WITH MINERALS) TABS tablet Take 1 tablet by mouth daily.    Marland Kitchen oxyCODONE (ROXICODONE) 5 MG immediate release tablet Take 0.5 tablets (2.5 mg total) by mouth every 8 (eight) hours as needed. 20 tablet 0  . polyethylene glycol (MIRALAX / GLYCOLAX) packet Take 17 g by mouth daily. 14 each 0  . senna-docusate (SENOKOT-S) 8.6-50 MG per tablet Take 1 tablet by mouth.     No current facility-administered medications on file prior to visit.     Physical exam:  Filed Vitals:   03/01/14 0842  BP: 143/69  Pulse: 79  Height: 5\' 5"  (1.651 m)  Weight: 113 lb (51.256 kg)  SpO2: 99%    Right upper extremity: Well-healed antecubital incision, 2+ radial and ulnar pulses. Mild strength asymmetry right hand to left hand  Assessment: Doing well status post right brachial embolectomy. Encouraged patient to continue exercising the right handed improve her strength overall. She will continue her Eliquis. She will follow-up with me on an as-needed basis.

## 2014-03-05 DIAGNOSIS — M6281 Muscle weakness (generalized): Secondary | ICD-10-CM | POA: Diagnosis not present

## 2014-03-05 DIAGNOSIS — R262 Difficulty in walking, not elsewhere classified: Secondary | ICD-10-CM | POA: Diagnosis not present

## 2014-03-05 DIAGNOSIS — M545 Low back pain: Secondary | ICD-10-CM | POA: Diagnosis not present

## 2014-03-06 DIAGNOSIS — R262 Difficulty in walking, not elsewhere classified: Secondary | ICD-10-CM | POA: Diagnosis not present

## 2014-03-06 DIAGNOSIS — M6281 Muscle weakness (generalized): Secondary | ICD-10-CM | POA: Diagnosis not present

## 2014-03-06 DIAGNOSIS — M545 Low back pain: Secondary | ICD-10-CM | POA: Diagnosis not present

## 2014-03-07 DIAGNOSIS — E119 Type 2 diabetes mellitus without complications: Secondary | ICD-10-CM | POA: Diagnosis not present

## 2014-03-07 DIAGNOSIS — R262 Difficulty in walking, not elsewhere classified: Secondary | ICD-10-CM | POA: Diagnosis not present

## 2014-03-07 DIAGNOSIS — Z681 Body mass index (BMI) 19 or less, adult: Secondary | ICD-10-CM | POA: Diagnosis not present

## 2014-03-07 DIAGNOSIS — Z418 Encounter for other procedures for purposes other than remedying health state: Secondary | ICD-10-CM | POA: Diagnosis not present

## 2014-03-07 DIAGNOSIS — R413 Other amnesia: Secondary | ICD-10-CM | POA: Diagnosis not present

## 2014-03-07 DIAGNOSIS — M545 Low back pain: Secondary | ICD-10-CM | POA: Diagnosis not present

## 2014-03-07 DIAGNOSIS — M6281 Muscle weakness (generalized): Secondary | ICD-10-CM | POA: Diagnosis not present

## 2014-03-07 DIAGNOSIS — E038 Other specified hypothyroidism: Secondary | ICD-10-CM | POA: Diagnosis not present

## 2014-03-07 DIAGNOSIS — F329 Major depressive disorder, single episode, unspecified: Secondary | ICD-10-CM | POA: Diagnosis not present

## 2014-03-07 DIAGNOSIS — I10 Essential (primary) hypertension: Secondary | ICD-10-CM | POA: Diagnosis not present

## 2014-03-09 DIAGNOSIS — M6281 Muscle weakness (generalized): Secondary | ICD-10-CM | POA: Diagnosis not present

## 2014-03-09 DIAGNOSIS — R262 Difficulty in walking, not elsewhere classified: Secondary | ICD-10-CM | POA: Diagnosis not present

## 2014-03-09 DIAGNOSIS — M545 Low back pain: Secondary | ICD-10-CM | POA: Diagnosis not present

## 2014-03-12 DIAGNOSIS — M545 Low back pain: Secondary | ICD-10-CM | POA: Diagnosis not present

## 2014-03-12 DIAGNOSIS — R262 Difficulty in walking, not elsewhere classified: Secondary | ICD-10-CM | POA: Diagnosis not present

## 2014-03-12 DIAGNOSIS — M6281 Muscle weakness (generalized): Secondary | ICD-10-CM | POA: Diagnosis not present

## 2014-03-15 DIAGNOSIS — M6281 Muscle weakness (generalized): Secondary | ICD-10-CM | POA: Diagnosis not present

## 2014-03-15 DIAGNOSIS — M545 Low back pain: Secondary | ICD-10-CM | POA: Diagnosis not present

## 2014-03-15 DIAGNOSIS — R262 Difficulty in walking, not elsewhere classified: Secondary | ICD-10-CM | POA: Diagnosis not present

## 2014-03-16 DIAGNOSIS — M6281 Muscle weakness (generalized): Secondary | ICD-10-CM | POA: Diagnosis not present

## 2014-03-16 DIAGNOSIS — M545 Low back pain: Secondary | ICD-10-CM | POA: Diagnosis not present

## 2014-03-16 DIAGNOSIS — R262 Difficulty in walking, not elsewhere classified: Secondary | ICD-10-CM | POA: Diagnosis not present

## 2014-05-26 DIAGNOSIS — K529 Noninfective gastroenteritis and colitis, unspecified: Secondary | ICD-10-CM | POA: Diagnosis not present

## 2014-05-26 DIAGNOSIS — R109 Unspecified abdominal pain: Secondary | ICD-10-CM | POA: Diagnosis not present

## 2014-05-26 DIAGNOSIS — Z9889 Other specified postprocedural states: Secondary | ICD-10-CM | POA: Diagnosis not present

## 2014-05-26 DIAGNOSIS — N280 Ischemia and infarction of kidney: Secondary | ICD-10-CM | POA: Diagnosis not present

## 2014-05-26 DIAGNOSIS — K6389 Other specified diseases of intestine: Secondary | ICD-10-CM | POA: Diagnosis not present

## 2014-06-01 DIAGNOSIS — I4891 Unspecified atrial fibrillation: Secondary | ICD-10-CM | POA: Diagnosis not present

## 2014-06-01 DIAGNOSIS — E119 Type 2 diabetes mellitus without complications: Secondary | ICD-10-CM | POA: Diagnosis not present

## 2014-06-01 DIAGNOSIS — K219 Gastro-esophageal reflux disease without esophagitis: Secondary | ICD-10-CM | POA: Diagnosis not present

## 2014-06-01 DIAGNOSIS — I10 Essential (primary) hypertension: Secondary | ICD-10-CM | POA: Diagnosis not present

## 2014-06-01 DIAGNOSIS — Z418 Encounter for other procedures for purposes other than remedying health state: Secondary | ICD-10-CM | POA: Diagnosis not present

## 2014-06-01 DIAGNOSIS — E038 Other specified hypothyroidism: Secondary | ICD-10-CM | POA: Diagnosis not present

## 2014-06-01 DIAGNOSIS — E785 Hyperlipidemia, unspecified: Secondary | ICD-10-CM | POA: Diagnosis not present

## 2014-09-03 DIAGNOSIS — E785 Hyperlipidemia, unspecified: Secondary | ICD-10-CM | POA: Diagnosis not present

## 2014-09-03 DIAGNOSIS — Z418 Encounter for other procedures for purposes other than remedying health state: Secondary | ICD-10-CM | POA: Diagnosis not present

## 2014-09-03 DIAGNOSIS — I10 Essential (primary) hypertension: Secondary | ICD-10-CM | POA: Diagnosis not present

## 2014-09-03 DIAGNOSIS — F329 Major depressive disorder, single episode, unspecified: Secondary | ICD-10-CM | POA: Diagnosis not present

## 2014-09-03 DIAGNOSIS — E559 Vitamin D deficiency, unspecified: Secondary | ICD-10-CM | POA: Diagnosis not present

## 2014-09-03 DIAGNOSIS — E119 Type 2 diabetes mellitus without complications: Secondary | ICD-10-CM | POA: Diagnosis not present

## 2014-09-03 DIAGNOSIS — Z79899 Other long term (current) drug therapy: Secondary | ICD-10-CM | POA: Diagnosis not present

## 2014-09-03 DIAGNOSIS — I4891 Unspecified atrial fibrillation: Secondary | ICD-10-CM | POA: Diagnosis not present

## 2014-09-03 NOTE — Patient Outreach (Signed)
[place] [facility]) Care Management  09/03/2014  [name] 05-08-1921 [id number]   Referral from [facility] Tier 4 list, assigned to Maury Dus, RN for patient outreach.  [name] L. [place] Dragoo, [place] Care Management Assistant

## 2014-09-11 ENCOUNTER — Other Ambulatory Visit: Payer: Self-pay

## 2014-09-11 DIAGNOSIS — M545 Low back pain: Secondary | ICD-10-CM | POA: Diagnosis not present

## 2014-09-11 DIAGNOSIS — S3992XA Unspecified injury of lower back, initial encounter: Secondary | ICD-10-CM | POA: Diagnosis not present

## 2014-09-11 DIAGNOSIS — M79661 Pain in right lower leg: Secondary | ICD-10-CM | POA: Diagnosis not present

## 2014-09-11 DIAGNOSIS — R531 Weakness: Secondary | ICD-10-CM | POA: Diagnosis not present

## 2014-09-11 DIAGNOSIS — Z9181 History of falling: Secondary | ICD-10-CM | POA: Diagnosis not present

## 2014-09-11 DIAGNOSIS — M5136 Other intervertebral disc degeneration, lumbar region: Secondary | ICD-10-CM | POA: Diagnosis not present

## 2014-09-11 DIAGNOSIS — M25561 Pain in right knee: Secondary | ICD-10-CM | POA: Diagnosis not present

## 2014-09-11 DIAGNOSIS — I10 Essential (primary) hypertension: Secondary | ICD-10-CM | POA: Diagnosis not present

## 2014-09-11 DIAGNOSIS — E119 Type 2 diabetes mellitus without complications: Secondary | ICD-10-CM | POA: Diagnosis not present

## 2014-09-11 DIAGNOSIS — Z950 Presence of cardiac pacemaker: Secondary | ICD-10-CM | POA: Diagnosis not present

## 2014-09-11 DIAGNOSIS — R6889 Other general symptoms and signs: Secondary | ICD-10-CM | POA: Diagnosis not present

## 2014-09-11 DIAGNOSIS — E039 Hypothyroidism, unspecified: Secondary | ICD-10-CM | POA: Diagnosis not present

## 2014-09-11 DIAGNOSIS — F0390 Unspecified dementia without behavioral disturbance: Secondary | ICD-10-CM | POA: Diagnosis not present

## 2014-09-11 DIAGNOSIS — S8991XA Unspecified injury of right lower leg, initial encounter: Secondary | ICD-10-CM | POA: Diagnosis not present

## 2014-09-11 NOTE — Patient Outreach (Signed)
[place] [facility]) Care Management  09/11/2014  [name] 1921/07/01 [id number]   Telephone call to patient for [facility] Tier 4 referral.  Female answers stating that patient is not available at this time.  Advised would try back another time.  Plan: RN will attempt patient within the next 1-2 weeks.  Jone Baseman, RN, MSN [place] [phone]

## 2014-09-13 ENCOUNTER — Other Ambulatory Visit: Payer: Self-pay

## 2014-09-13 NOTE — Patient Outreach (Signed)
[name] - [facility]) Care Management  09/13/2014  [name] [birth year] [id number]   Second telephone call to patient regarding [facility] referral.  No answer voice message left.  HIPAA compliant message left.    Plan: RN will contact patient within one week.  Jone Baseman, RN, MSN [place] [phone]

## 2014-09-17 ENCOUNTER — Other Ambulatory Visit: Payer: Self-pay

## 2014-09-17 NOTE — Patient Outreach (Signed)
[place] [facility]) Care Management  09/17/2014  [name] 1921-02-13 [id number]  Third telephone call to patient regarding [facility] tier 4 list referral.  No answer voice message left.  HIPAA compliant message left.    Plan: RN will send letter and pamphlet to patient.  Jone Baseman, RN, MSN [place] [phone]

## 2014-09-21 DIAGNOSIS — Z6824 Body mass index (BMI) 24.0-24.9, adult: Secondary | ICD-10-CM | POA: Diagnosis not present

## 2014-09-21 DIAGNOSIS — M545 Low back pain: Secondary | ICD-10-CM | POA: Diagnosis not present

## 2014-09-21 DIAGNOSIS — W19XXXA Unspecified fall, initial encounter: Secondary | ICD-10-CM | POA: Diagnosis not present

## 2014-09-24 DIAGNOSIS — Z950 Presence of cardiac pacemaker: Secondary | ICD-10-CM | POA: Diagnosis not present

## 2014-09-24 DIAGNOSIS — M6281 Muscle weakness (generalized): Secondary | ICD-10-CM | POA: Diagnosis not present

## 2014-09-24 DIAGNOSIS — M545 Low back pain: Secondary | ICD-10-CM | POA: Diagnosis not present

## 2014-09-24 DIAGNOSIS — R001 Bradycardia, unspecified: Secondary | ICD-10-CM | POA: Diagnosis not present

## 2014-09-24 DIAGNOSIS — R262 Difficulty in walking, not elsewhere classified: Secondary | ICD-10-CM | POA: Diagnosis not present

## 2014-09-24 DIAGNOSIS — Z9181 History of falling: Secondary | ICD-10-CM | POA: Diagnosis not present

## 2014-09-27 DIAGNOSIS — M6281 Muscle weakness (generalized): Secondary | ICD-10-CM | POA: Diagnosis not present

## 2014-09-27 DIAGNOSIS — M545 Low back pain: Secondary | ICD-10-CM | POA: Diagnosis not present

## 2014-09-27 DIAGNOSIS — Z9181 History of falling: Secondary | ICD-10-CM | POA: Diagnosis not present

## 2014-09-27 DIAGNOSIS — R001 Bradycardia, unspecified: Secondary | ICD-10-CM | POA: Diagnosis not present

## 2014-09-27 DIAGNOSIS — R262 Difficulty in walking, not elsewhere classified: Secondary | ICD-10-CM | POA: Diagnosis not present

## 2014-09-27 DIAGNOSIS — Z950 Presence of cardiac pacemaker: Secondary | ICD-10-CM | POA: Diagnosis not present

## 2014-10-01 NOTE — Patient Outreach (Signed)
[place] [facility]) Care Management  10/01/2014  [name] March 13, 1921 [id number]   No response from patient after 3 outreach calls and letter.  Plan: RN Health Coach will forward patient information to Lurline Del for case closure.   RN Health Coach will send letter to primary care physician notifying of case closure.  Jone Baseman, RN, MSN [place] [phone]

## 2014-10-08 DIAGNOSIS — M545 Low back pain: Secondary | ICD-10-CM | POA: Diagnosis not present

## 2014-10-08 DIAGNOSIS — M6281 Muscle weakness (generalized): Secondary | ICD-10-CM | POA: Diagnosis not present

## 2014-10-08 DIAGNOSIS — Z950 Presence of cardiac pacemaker: Secondary | ICD-10-CM | POA: Diagnosis not present

## 2014-10-08 DIAGNOSIS — Z9181 History of falling: Secondary | ICD-10-CM | POA: Diagnosis not present

## 2014-10-08 DIAGNOSIS — R262 Difficulty in walking, not elsewhere classified: Secondary | ICD-10-CM | POA: Diagnosis not present

## 2014-10-08 DIAGNOSIS — R001 Bradycardia, unspecified: Secondary | ICD-10-CM | POA: Diagnosis not present

## 2014-10-10 DIAGNOSIS — R001 Bradycardia, unspecified: Secondary | ICD-10-CM | POA: Diagnosis not present

## 2014-10-10 DIAGNOSIS — R262 Difficulty in walking, not elsewhere classified: Secondary | ICD-10-CM | POA: Diagnosis not present

## 2014-10-10 DIAGNOSIS — M6281 Muscle weakness (generalized): Secondary | ICD-10-CM | POA: Diagnosis not present

## 2014-10-10 DIAGNOSIS — Z950 Presence of cardiac pacemaker: Secondary | ICD-10-CM | POA: Diagnosis not present

## 2014-10-10 DIAGNOSIS — M545 Low back pain: Secondary | ICD-10-CM | POA: Diagnosis not present

## 2014-10-10 DIAGNOSIS — Z9181 History of falling: Secondary | ICD-10-CM | POA: Diagnosis not present

## 2014-10-10 NOTE — Patient Outreach (Signed)
[place] [facility]) Care Management  10/10/2014  PAMLA PANGLE August 10, 1921 [id number]   Notification from Jon Billings, RN to close case due to unable to establish contact for [place] Management.  Thanks, Ronnell Freshwater. [place], [place] Assistant Phone: [phone] Fax: [phone]

## 2014-10-16 DIAGNOSIS — R001 Bradycardia, unspecified: Secondary | ICD-10-CM | POA: Diagnosis not present

## 2014-10-16 DIAGNOSIS — R262 Difficulty in walking, not elsewhere classified: Secondary | ICD-10-CM | POA: Diagnosis not present

## 2014-10-16 DIAGNOSIS — Z9181 History of falling: Secondary | ICD-10-CM | POA: Diagnosis not present

## 2014-10-16 DIAGNOSIS — Z950 Presence of cardiac pacemaker: Secondary | ICD-10-CM | POA: Diagnosis not present

## 2014-10-16 DIAGNOSIS — M6281 Muscle weakness (generalized): Secondary | ICD-10-CM | POA: Diagnosis not present

## 2014-10-16 DIAGNOSIS — M545 Low back pain: Secondary | ICD-10-CM | POA: Diagnosis not present

## 2014-10-18 DIAGNOSIS — Z950 Presence of cardiac pacemaker: Secondary | ICD-10-CM | POA: Diagnosis not present

## 2014-10-18 DIAGNOSIS — R262 Difficulty in walking, not elsewhere classified: Secondary | ICD-10-CM | POA: Diagnosis not present

## 2014-10-18 DIAGNOSIS — M6281 Muscle weakness (generalized): Secondary | ICD-10-CM | POA: Diagnosis not present

## 2014-10-18 DIAGNOSIS — Z9181 History of falling: Secondary | ICD-10-CM | POA: Diagnosis not present

## 2014-10-18 DIAGNOSIS — M545 Low back pain: Secondary | ICD-10-CM | POA: Diagnosis not present

## 2014-10-18 DIAGNOSIS — R001 Bradycardia, unspecified: Secondary | ICD-10-CM | POA: Diagnosis not present

## 2014-12-05 DIAGNOSIS — E119 Type 2 diabetes mellitus without complications: Secondary | ICD-10-CM | POA: Diagnosis not present

## 2014-12-05 DIAGNOSIS — Z418 Encounter for other procedures for purposes other than remedying health state: Secondary | ICD-10-CM | POA: Diagnosis not present

## 2014-12-05 DIAGNOSIS — Z23 Encounter for immunization: Secondary | ICD-10-CM | POA: Diagnosis not present

## 2014-12-05 DIAGNOSIS — I10 Essential (primary) hypertension: Secondary | ICD-10-CM | POA: Diagnosis not present

## 2014-12-05 DIAGNOSIS — Z79899 Other long term (current) drug therapy: Secondary | ICD-10-CM | POA: Diagnosis not present

## 2014-12-05 DIAGNOSIS — E785 Hyperlipidemia, unspecified: Secondary | ICD-10-CM | POA: Diagnosis not present

## 2014-12-05 DIAGNOSIS — E038 Other specified hypothyroidism: Secondary | ICD-10-CM | POA: Diagnosis not present

## 2014-12-05 DIAGNOSIS — E559 Vitamin D deficiency, unspecified: Secondary | ICD-10-CM | POA: Diagnosis not present

## 2015-01-05 ENCOUNTER — Inpatient Hospital Stay (HOSPITAL_COMMUNITY)
Admission: AD | Admit: 2015-01-05 | Discharge: 2015-01-08 | DRG: 258 | Disposition: A | Discharge status: Skilled Nursing Facility | Payer: Commercial Managed Care - HMO | Source: Other Acute Inpatient Hospital | Attending: Cardiology | Admitting: Cardiology

## 2015-01-05 DIAGNOSIS — I495 Sick sinus syndrome: Secondary | ICD-10-CM | POA: Diagnosis not present

## 2015-01-05 DIAGNOSIS — I472 Ventricular tachycardia, unspecified: Secondary | ICD-10-CM

## 2015-01-05 DIAGNOSIS — F0390 Unspecified dementia without behavioral disturbance: Secondary | ICD-10-CM | POA: Diagnosis present

## 2015-01-05 DIAGNOSIS — J301 Allergic rhinitis due to pollen: Secondary | ICD-10-CM | POA: Diagnosis not present

## 2015-01-05 DIAGNOSIS — R52 Pain, unspecified: Secondary | ICD-10-CM

## 2015-01-05 DIAGNOSIS — N390 Urinary tract infection, site not specified: Secondary | ICD-10-CM | POA: Diagnosis present

## 2015-01-05 DIAGNOSIS — R410 Disorientation, unspecified: Secondary | ICD-10-CM | POA: Diagnosis present

## 2015-01-05 DIAGNOSIS — Z7901 Long term (current) use of anticoagulants: Secondary | ICD-10-CM

## 2015-01-05 DIAGNOSIS — S42002D Fracture of unspecified part of left clavicle, subsequent encounter for fracture with routine healing: Secondary | ICD-10-CM | POA: Diagnosis not present

## 2015-01-05 DIAGNOSIS — E785 Hyperlipidemia, unspecified: Secondary | ICD-10-CM | POA: Diagnosis present

## 2015-01-05 DIAGNOSIS — K219 Gastro-esophageal reflux disease without esophagitis: Secondary | ICD-10-CM | POA: Diagnosis not present

## 2015-01-05 DIAGNOSIS — I442 Atrioventricular block, complete: Secondary | ICD-10-CM | POA: Diagnosis present

## 2015-01-05 DIAGNOSIS — Z950 Presence of cardiac pacemaker: Secondary | ICD-10-CM | POA: Diagnosis not present

## 2015-01-05 DIAGNOSIS — E039 Hypothyroidism, unspecified: Secondary | ICD-10-CM | POA: Diagnosis not present

## 2015-01-05 DIAGNOSIS — R55 Syncope and collapse: Secondary | ICD-10-CM | POA: Diagnosis not present

## 2015-01-05 DIAGNOSIS — I10 Essential (primary) hypertension: Secondary | ICD-10-CM | POA: Diagnosis not present

## 2015-01-05 DIAGNOSIS — I4891 Unspecified atrial fibrillation: Secondary | ICD-10-CM | POA: Diagnosis present

## 2015-01-05 DIAGNOSIS — G9340 Encephalopathy, unspecified: Secondary | ICD-10-CM

## 2015-01-05 DIAGNOSIS — R079 Chest pain, unspecified: Secondary | ICD-10-CM | POA: Diagnosis not present

## 2015-01-05 DIAGNOSIS — Z79899 Other long term (current) drug therapy: Secondary | ICD-10-CM

## 2015-01-05 DIAGNOSIS — Z8249 Family history of ischemic heart disease and other diseases of the circulatory system: Secondary | ICD-10-CM

## 2015-01-05 DIAGNOSIS — S42035A Nondisplaced fracture of lateral end of left clavicle, initial encounter for closed fracture: Secondary | ICD-10-CM | POA: Diagnosis present

## 2015-01-05 DIAGNOSIS — R0602 Shortness of breath: Secondary | ICD-10-CM | POA: Diagnosis not present

## 2015-01-05 DIAGNOSIS — T82191A Other mechanical complication of cardiac pulse generator (battery), initial encounter: Principal | ICD-10-CM | POA: Diagnosis present

## 2015-01-05 DIAGNOSIS — Z853 Personal history of malignant neoplasm of breast: Secondary | ICD-10-CM | POA: Diagnosis not present

## 2015-01-05 DIAGNOSIS — E876 Hypokalemia: Secondary | ICD-10-CM | POA: Diagnosis present

## 2015-01-05 DIAGNOSIS — N3000 Acute cystitis without hematuria: Secondary | ICD-10-CM | POA: Diagnosis not present

## 2015-01-05 DIAGNOSIS — R000 Tachycardia, unspecified: Secondary | ICD-10-CM | POA: Diagnosis not present

## 2015-01-05 DIAGNOSIS — Z9181 History of falling: Secondary | ICD-10-CM | POA: Diagnosis not present

## 2015-01-05 DIAGNOSIS — S4992XA Unspecified injury of left shoulder and upper arm, initial encounter: Secondary | ICD-10-CM | POA: Diagnosis not present

## 2015-01-05 DIAGNOSIS — W19XXXA Unspecified fall, initial encounter: Secondary | ICD-10-CM | POA: Diagnosis present

## 2015-01-05 DIAGNOSIS — I5032 Chronic diastolic (congestive) heart failure: Secondary | ICD-10-CM | POA: Diagnosis present

## 2015-01-05 DIAGNOSIS — Y838 Other surgical procedures as the cause of abnormal reaction of the patient, or of later complication, without mention of misadventure at the time of the procedure: Secondary | ICD-10-CM | POA: Diagnosis present

## 2015-01-05 DIAGNOSIS — E119 Type 2 diabetes mellitus without complications: Secondary | ICD-10-CM | POA: Diagnosis not present

## 2015-01-05 DIAGNOSIS — R001 Bradycardia, unspecified: Secondary | ICD-10-CM | POA: Diagnosis not present

## 2015-01-05 DIAGNOSIS — M25552 Pain in left hip: Secondary | ICD-10-CM | POA: Diagnosis not present

## 2015-01-05 DIAGNOSIS — S199XXA Unspecified injury of neck, initial encounter: Secondary | ICD-10-CM | POA: Diagnosis not present

## 2015-01-05 DIAGNOSIS — I481 Persistent atrial fibrillation: Secondary | ICD-10-CM | POA: Diagnosis not present

## 2015-01-05 DIAGNOSIS — S0990XA Unspecified injury of head, initial encounter: Secondary | ICD-10-CM | POA: Diagnosis not present

## 2015-01-05 DIAGNOSIS — R4182 Altered mental status, unspecified: Secondary | ICD-10-CM | POA: Diagnosis not present

## 2015-01-05 DIAGNOSIS — M25512 Pain in left shoulder: Secondary | ICD-10-CM | POA: Diagnosis not present

## 2015-01-05 MED ORDER — AMIODARONE LOAD VIA INFUSION
150.0000 mg | Freq: Once | INTRAVENOUS | Status: DC
  Filled 2015-01-05: qty 83.34

## 2015-01-05 MED ORDER — SODIUM CHLORIDE 0.9 % IV SOLN
INTRAVENOUS | Status: DC
  Administered 2015-01-06: via INTRAVENOUS

## 2015-01-05 MED ORDER — MAGNESIUM SULFATE 50 % IJ SOLN: 2.0000 g | Freq: Once | INTRAMUSCULAR | Status: DC

## 2015-01-05 MED ORDER — AMIODARONE HCL IN DEXTROSE 360-4.14 MG/200ML-% IV SOLN
INTRAVENOUS | Status: AC
  Administered 2015-01-05: 200 mL
  Filled 2015-01-05: qty 200

## 2015-01-05 MED ORDER — ATROPINE SULFATE 0.1 MG/ML IJ SOLN
INTRAMUSCULAR | Status: AC
  Filled 2015-01-05: qty 10

## 2015-01-05 MED ORDER — AMIODARONE HCL IN DEXTROSE 360-4.14 MG/200ML-% IV SOLN
30.0000 mg/h | INTRAVENOUS | Status: DC
  Administered 2015-01-06 (×2): 30 mg/h via INTRAVENOUS
  Filled 2015-01-05: qty 200

## 2015-01-05 MED ORDER — MAGNESIUM SULFATE 2 GM/50ML IV SOLN
INTRAVENOUS | Status: AC
  Administered 2015-01-05: 2 g
  Filled 2015-01-05: qty 50

## 2015-01-05 MED ORDER — MAGNESIUM SULFATE 2 GM/50ML IV SOLN
2.0000 g | Freq: Once | INTRAVENOUS | Status: AC
Start: 2015-01-05 — End: 2015-01-06

## 2015-01-05 MED ORDER — AMIODARONE HCL IN DEXTROSE 360-4.14 MG/200ML-% IV SOLN
60.0000 mg/h | INTRAVENOUS | Status: DC
  Administered 2015-01-06: 60 mg/h via INTRAVENOUS
  Filled 2015-01-05: qty 200

## 2015-01-05 NOTE — Progress Notes (Signed)
Received pt from EMS in stepdown  alert but confused, bradycardic HR=45; on Isoproterenol gtt. S/P fall from home. Large bruise noted @ Left shoulder, scab left heel, abrasion B/L knee. Dr. Daryll Drown informed of admission.  At 22:45 & 22:53 had VT shock & CPR done. Dr. Daryll Drown at bedside. Dr. Daryll Drown spoke to son at bedside of pt's status. Isoproterenol D/C'd as ordered. At 22:57 Amiodarone started with bolus; 23:00 Magnesium 2 g given as ordered.Dr. Alejandro Mulling  & CCM informed. Report given to [facility]. Transferred to ICU as ordered.

## 2015-01-05 NOTE — Consult Note (Addendum)
Reason for Consult: bradycardia and VT Referring Physician: Primary Cardiologist: previously Dr. Brodie  [name] is an 81 y.o. female.  HPI: [name] is a 81 yo woman with PMH of hypertension, atrial fibrillation previously on xarelto, sick sinus rhythm with MDT pacemaker, previous SAH/SDH leading to discontinuation of xarelto 11/2013 who had December 2015 embolus to right hand s/p right axillary brachial ulnar and radial artery embolectomy who originally followed by Dr. Brodie who hasn't had a device check since June 2014 when she was noted to have approximately 2 years of battery life remaining. This evening she presented to [facility] who son found her unresponsive on the group at home. She was found to have intermittent bradycardia and VT at [place]. They started isoproteronol and when the hospitalist saw [name] in our stepdown unit she had tachycardia and went into VT requiring a shock that led to a sinus rhythm, ? Capture of pacemaker followed by bradycardia. She had one more shock. She was started on magnesium and amiodarone and transferred to ICU. Of note, she received 1 mg of atropine x3 at [place], 2L NS fluid. Her son has been present and expressed that she does not want to be on a ventilator however if it was for a short period of time, she would be in desire of it. For now, she remains full code. The [employer] reps have been called to assist with device interrogation.   Past Medical History  Diagnosis Date  . Sick sinus syndrome     s/p PPM by Dr Brodie (MDT)  . Hypertension   . FHx: mastectomy     for breast cancer  . Persistent atrial fibrillation     on pradaxa  . Gastro - esophageal reflux disease   . Allergic rhinitis due to pollen   . Other specified acquired hypothyroidism   . Body mass index 28.0-28.9, adult   . Other and unspecified hyperlipidemia   . Encounter for long-term (current) use of other medications   . Other B-complex deficiencies   . Memory  loss   . Pain in limb   . Dizziness and giddiness   . Diastolic dysfunction   . Chronic anticoagulation     Past Surgical History  Procedure Laterality Date  . Pacemaker insertion      by Dr Brodie for tachy/brady syndrome (MDT)  . Mastectomy    . Tonsillectomy    . Appendectomy    . Embolectomy Left 12/30/2012    Procedure: EMBOLECTOMY LEFT LEG, with patch angioplasty;  Surgeon: Vance W Brabham, MD;  Location: [facility] OR;  Service: Vascular;  Laterality: Left;  . Embolectomy Right 02/05/2014    Procedure: Right Brachial, Axillary, Ulnar and Radial Embolectomy;  Surgeon: Charles E Fields, MD;  Location: [facility] OR;  Service: Vascular;  Laterality: Right;    Family History  Problem Relation Age of Onset  . Heart disease Maternal Grandfather   . Diabetes Sister   . Tuberculosis Mother     father  . Diabetes Brother   . Stroke Brother     Social History:  reports that she has never smoked. She does not have any smokeless tobacco history on file. She reports that she does not drink alcohol or use illicit drugs.  Allergies: No Known Allergies  Medications:  I have reviewed the patient's current medications. Prior to Admission:  Prescriptions prior to admission  Medication Sig Dispense Refill Last Dose  . apixaban (ELIQUIS) 2.5 MG TABS tablet Take 1 tablet (2.5 mg   total) by mouth 2 (two) times daily. 60 tablet 3 Taking  . calcium carbonate (OS-CAL) 600 MG TABS tablet Take 600 mg by mouth daily with breakfast.   Taking  . Cyanocobalamin (VITAMIN B-12 CR) 1500 MCG TBCR Take 1,500 mcg by mouth daily.   Taking  . diltiazem (TIAZAC) 180 MG 24 hr capsule Take 180 mg by mouth daily.   Taking  . docusate sodium 100 MG CAPS Take 100 mg by mouth daily. 10 capsule 0 Taking  . escitalopram (LEXAPRO) 10 MG tablet Take 10 mg by mouth daily.   Taking  . isosorbide-hydrALAZINE (BIDIL) 20-37.5 MG per tablet Take 1 tablet by mouth 2 (two) times daily.   Taking  . lansoprazole (PREVACID) 30 MG capsule  Take 30 mg by mouth daily at 12 noon.   Taking  . levETIRAcetam (KEPPRA) 500 MG tablet Take 500 mg by mouth 2 (two) times daily.  3 Taking  . levothyroxine (SYNTHROID, LEVOTHROID) 88 MCG tablet Take 88 mcg by mouth daily before breakfast.    Taking  . loratadine (CLARITIN) 10 MG tablet Take 10 mg by mouth daily.   Taking  . mirtazapine (REMERON) 15 MG tablet Take 15 mg by mouth at bedtime.  0 Taking  . Multiple Vitamin (MULTIVITAMIN WITH MINERALS) TABS tablet Take 1 tablet by mouth daily.   Taking  . oxyCODONE (ROXICODONE) 5 MG immediate release tablet Take 0.5 tablets (2.5 mg total) by mouth every 8 (eight) hours as needed. 20 tablet 0 Taking  . polyethylene glycol (MIRALAX / GLYCOLAX) packet Take 17 g by mouth daily. 14 each 0 Taking  . senna-docusate (SENOKOT-S) 8.6-50 MG per tablet Take 1 tablet by mouth.   Taking   Scheduled: . amiodarone  150 mg Intravenous Once  . magnesium sulfate 1 - 4 g bolus IVPB  2 g Intravenous Once    No results found for this or any previous visit (from the past 48 hour(s)).  No results found.  Review of Systems  Unable to perform ROS: critical illness   Blood pressure 147/50, pulse 71, temperature 98.4 F (36.9 C), temperature source Oral, resp. rate 21, height 5' 4" (1.626 m), weight 74.8 kg (164 lb 14.5 oz), SpO2 100 %. Physical Exam  Constitutional: She appears distressed.  HENT:  Confused, somnolent, intermittently alert  Eyes: Conjunctivae and EOM are normal. Pupils are equal, round, and reactive to light. No scleral icterus.  Neck: Normal range of motion. Neck supple. No tracheal deviation present.  Cardiovascular: Intact distal pulses.  Exam reveals no gallop.   No murmur heard. Intermittently bradycardic, tachycardic with Frequent ectopy  Respiratory: Effort normal. No respiratory distress. She has no wheezes. She has rales.  Bibasilar rales  GI: Soft. Bowel sounds are normal. She exhibits no distension. There is no tenderness.    Musculoskeletal: Normal range of motion. She exhibits edema. She exhibits no tenderness.  Trace edema bilaterally  Neurological:  Intermittently alert and confused; unable to fully assess neuro exam  Skin: Skin is warm. She is diaphoretic.  Psychiatric:  Flat affect, somnolent  labs reviewed at OSH, INR 1.1, wbc 11.6, h/h 12.9/39.3, plt 128 7.46/24/90 Na 146, 3.9 K, 111 cl, co2 19, glucose 200, BUn/Cr 15/1.2, albumin 4.1, total protein 7, Trop I 0.12, AST/ALT 62/48, alk phos 158, lactate 4 Urinalysis 1.025, 3+ protein, trace leukocytosis, 5-10 wbc, few bacteria EKG and telemetry reviewed; VT, junctional bradycardia with PVCs Assessment/Plan: [name] is a 81 yo woman with PMH of atrial fibrillation off xarelto for  SAH/SDH, [employer] PM for sinus node dysfunction, hypertension and last device check June 2014 when it was noted she had 2 years of expected battery life remaining who presented to [facility] ER with lethargic, bradycardia and now transferred to [facility] with intermittent bradycardia, NSVT and VT. Etiology for her sinus node dysfunction is MDT pacemaker at end of life vs. UTI vs. Progressive conduction system disease versus ischemia. For now, awaiting MDT interrogation, treating arrhythmia with amiodarone and will evaluate for infectious and other triggers (thyroid, ischemia).  Problem List Bradycardia Unresponsive episode/Fall Ventricular tachycardia Sinus node dysfunction Altered mental status Hypothyroidism  Plan 1. ICU, telemetry, trend cardiac biomarkers, pacer pads in place 2. TSH, cardiac biomarkers 3. Ceftriaxone per pharmacy for possible UTI, send urine for culture 4. Echocardiogram in AM 5. Likely discussion with EP regarding interrogation and timing/role for generator change 6. CT head when clinically stable --> already performed per report/chart and unrevealing at [facility] [place] 01/05/2015, 11:57 PM

## 2015-01-05 NOTE — H&P (Signed)
[facility] History and Physical  [name] [id number] DOB: 07-16-1921 DOA: 01/05/2015  Referring physician: ED physician PCP: Wende Neighbors, MD   Chief Complaint: found down  HPI:  Found confused on the floor, rapid heart rate with occasional Vtach per report from [facility] ED.  When I arrived to the floor, patient went rapidly into Vtach and required a shock.  She returned to sinus rhythm with some capture of her pacemaker, but then proceeded into sinus bradycardia.  She remained in the 40s for a short while, then transitioned to [place] again after a PVC.  She required 1 further shock.  Amiodarone bolus was started and she was given a bolus of 2g of magnesium.  She was somewhat responsive during this time stating that she was in pain.  Patient's son was present who confirmed initial presentation and noted that she has expressed not wanting to be on a ventilator, however, if she were to need it to get through this time, would likely be okay.  There are no clear wishes for DNR status per discussion.    In the ED in [place], by chart review she was given the following for bradycardia.  She was initiated in on isoproterenol drip which was d/c'd on arrival here.  Atropine 1mg  X 3 given NS 1L bolus X 2 given Zofran 4mg  given  Assessment and Plan:  Atrial fibrillation with Sick sinus syndrome, likely dysfunctioning PPM - She was transitioning between Vtach and bradycardia, improved with amiodarone.  - She will be transitioned to the ICU, discussed with Cardiology fellow.  - Will discuss with pulmonary critical care as well - IV amiodarone drip initiated - Labs including stat BMET, mag and troponin were sent - EKG ordered - Further plans per ICU team.    Possible UTI - Reported from [facility] - elevated WBC and wbc in urine - Send urine culture - [place] - Rocephin given at [place]  Radiology reports reviewed from [facility]  CXR   Cardiac enlargement without vascular congestion or edema, possible small right pleural effusion  Ct head and spine No acute abnormalities  CTA of the chest No PE  Code Status: Possible partial code, treating at full at this time Family Communication: Son at bedside Disposition Plan: Admit for further evaluation    Gilles Chiquito, MD [phone]   Review of Systems:  Unable to obtain due to patient code and AMS.  She reported being in pain.    Per chart only Past Medical History  Diagnosis Date  . Sick sinus syndrome     s/p PPM by Dr Olevia Perches (MDT)  . Hypertension   . FHx: mastectomy     for breast cancer  . Persistent atrial fibrillation     on pradaxa  . Gastro - esophageal reflux disease   . Allergic rhinitis due to pollen   . Other specified acquired hypothyroidism   . Body mass index 28.0-28.9, adult   . Other and unspecified hyperlipidemia   . Encounter for long-term (current) use of other medications   . Other B-complex deficiencies   . Memory loss   . Pain in limb   . Dizziness and giddiness   . Diastolic dysfunction   . Chronic anticoagulation    Per chart only Past Surgical History  Procedure Laterality Date  . Pacemaker insertion      by Dr Olevia Perches for tachy/brady syndrome (MDT)  . Mastectomy    . Tonsillectomy    .  Appendectomy    . Embolectomy Left 12/30/2012    Procedure: EMBOLECTOMY LEFT LEG, with patch angioplasty;  Surgeon: Serafina Mitchell, MD;  [facility] OR;  Service: Vascular;  Laterality: Left;  . Embolectomy Right 02/05/2014    Procedure: Right Brachial, Axillary, Ulnar and Radial Embolectomy;  Surgeon: Elam Dutch, MD;  Location: [place];  Service: Vascular;  Laterality: Right;   Per chart only Social History:  reports that she has never smoked. She does not have any smokeless tobacco history on file. She reports that she does not drink alcohol or use illicit drugs.  No Known Allergies  Per chart only Family History  Problem  Relation Age of Onset  . Heart disease Maternal Grandfather   . Diabetes Sister   . Tuberculosis Mother     father  . Diabetes Brother   . Stroke Brother    Reported per chart Prior to Admission medications   Medication Sig Start Date End Date Taking? Authorizing Provider  apixaban (ELIQUIS) 2.5 MG TABS tablet Take 1 tablet (2.5 mg total) by mouth 2 (two) times daily. 02/08/14   Samantha J Rhyne, PA-C  calcium carbonate (OS-CAL) 600 MG TABS tablet Take 600 mg by mouth daily with breakfast.    Historical Provider, MD  Cyanocobalamin (VITAMIN B-12 CR) 1500 MCG TBCR Take 1,500 mcg by mouth daily.    Historical Provider, MD  diltiazem (TIAZAC) 180 MG 24 hr capsule Take 180 mg by mouth daily. 01/27/14   Historical Provider, MD  docusate sodium 100 MG CAPS Take 100 mg by mouth daily. 01/03/13   Barton Dubois, MD  escitalopram (LEXAPRO) 10 MG tablet Take 10 mg by mouth daily.    Historical Provider, MD  isosorbide-hydrALAZINE (BIDIL) 20-37.5 MG per tablet Take 1 tablet by mouth 2 (two) times daily. 01/03/13   Barton Dubois, MD  lansoprazole (PREVACID) 30 MG capsule Take 30 mg by mouth daily at 12 noon.    Historical Provider, MD  levETIRAcetam (KEPPRA) 500 MG tablet Take 500 mg by mouth 2 (two) times daily. 01/03/14   Historical Provider, MD  levothyroxine (SYNTHROID, LEVOTHROID) 88 MCG tablet Take 88 mcg by mouth daily before breakfast.  01/26/14   Historical Provider, MD  loratadine (CLARITIN) 10 MG tablet Take 10 mg by mouth daily.    Historical Provider, MD  mirtazapine (REMERON) 15 MG tablet Take 15 mg by mouth at bedtime. 01/03/14   Historical Provider, MD  Multiple Vitamin (MULTIVITAMIN WITH MINERALS) TABS tablet Take 1 tablet by mouth daily.    Historical Provider, MD  oxyCODONE (ROXICODONE) 5 MG immediate release tablet Take 0.5 tablets (2.5 mg total) by mouth every 8 (eight) hours as needed. 02/08/14   Samantha J Rhyne, PA-C  polyethylene glycol (MIRALAX / GLYCOLAX) packet Take 17 g by  mouth daily. 01/03/13   Barton Dubois, MD  senna-docusate (SENOKOT-S) 8.6-50 MG per tablet Take 1 tablet by mouth. 12/14/13   Historical Provider, MD    Physical Exam: Filed Vitals:   01/05/15 2230 01/05/15 2245 01/05/15 2300  BP: 147/52 158/60 142/50  Pulse: 45 52 44  Temp: 98.4 F (36.9 C)    TempSrc: Oral    Resp: 21 45 23  SpO2: 94% 100% 98%   Vitals review [place] Pulse in the 30s-50s, BP in the [id number] systolic, pulse ox 93 - 99 Tmax 99.1  Physical Exam  Constitutional: Elderly, chronically ill appearing.  Eyes: Conjunctivaeare normal. , no scleral icterus.  CVS: Bradycardic, S1/S2 +, no murmurs Pulmonary: Labored breathing  Abdominal: Soft. BS +,  no distension Neuro: Alert to name and pain Skin: Skin is warm and dry.   Labs on Admission:  WBC: 11.6 H/H 12.9 Hct 39.3 Plt 128  INR 1.1  ABG 7.06/02/88  Na 146 K 3.9 Cl 111 Bicarb 29 Cr 1.2 AG 20 TnI 0.12  AST 62 ALT 48  LA 4.0  UA 3+ protein, trace leuk, negative nitrite, few bac, occ epi cells  EKG: Normal sinus rhythm, no ST/T wave changes   If 7PM-7AM, please contact night-coverage [email or web address] Password [email or web address] 01/05/2015, 11:15 PM

## 2015-01-06 ENCOUNTER — Encounter (HOSPITAL_COMMUNITY)
Admission: AD | Disposition: A | Discharge status: Skilled Nursing Facility | Payer: Commercial Managed Care - HMO | Source: Other Acute Inpatient Hospital | Attending: Cardiology

## 2015-01-06 ENCOUNTER — Encounter (HOSPITAL_COMMUNITY): Payer: Self-pay

## 2015-01-06 ENCOUNTER — Inpatient Hospital Stay (HOSPITAL_COMMUNITY): Payer: Commercial Managed Care - HMO

## 2015-01-06 DIAGNOSIS — I442 Atrioventricular block, complete: Secondary | ICD-10-CM

## 2015-01-06 DIAGNOSIS — I481 Persistent atrial fibrillation: Secondary | ICD-10-CM

## 2015-01-06 DIAGNOSIS — I495 Sick sinus syndrome: Secondary | ICD-10-CM

## 2015-01-06 DIAGNOSIS — I472 Ventricular tachycardia, unspecified: Secondary | ICD-10-CM

## 2015-01-06 HISTORY — PX: EP IMPLANTABLE DEVICE: SHX172B

## 2015-01-06 LAB — TROPONIN I
TROPONIN I: 0.12 ng/mL — AB (ref <0.031)
TROPONIN I: 0.12 ng/mL — AB (ref <0.031)
TROPONIN I: 0.14 ng/mL — AB (ref <0.031)
Troponin I: 0.13 ng/mL — ABNORMAL HIGH (ref <0.031)

## 2015-01-06 LAB — COMPREHENSIVE METABOLIC PANEL
ALBUMIN: 3.3 g/dL — AB (ref 3.5–5.0)
ALK PHOS: 113 U/L (ref 38–126)
ALT: 35 U/L (ref 14–54)
ANION GAP: 13 (ref 5–15)
AST: 52 U/L — ABNORMAL HIGH (ref 15–41)
BILIRUBIN TOTAL: 0.5 mg/dL (ref 0.3–1.2)
BUN: 14 mg/dL (ref 6–20)
CALCIUM: 8.5 mg/dL — AB (ref 8.9–10.3)
CO2: 16 mmol/L — ABNORMAL LOW (ref 22–32)
Chloride: 113 mmol/L — ABNORMAL HIGH (ref 101–111)
Creatinine, Ser: 1.4 mg/dL — ABNORMAL HIGH (ref 0.44–1.00)
GFR, EST AFRICAN AMERICAN: 37 mL/min — AB (ref >60)
GFR, EST NON AFRICAN AMERICAN: 32 mL/min — AB (ref >60)
GLUCOSE: 247 mg/dL — AB (ref 65–99)
Potassium: 3.9 mmol/L (ref 3.5–5.1)
Sodium: 142 mmol/L (ref 135–145)
TOTAL PROTEIN: 5.7 g/dL — AB (ref 6.5–8.1)

## 2015-01-06 LAB — T4, FREE: FREE T4: 0.71 ng/dL (ref 0.61–1.12)

## 2015-01-06 LAB — CBC
HEMATOCRIT: 36.1 % (ref 36.0–46.0)
Hemoglobin: 11.9 g/dL — ABNORMAL LOW (ref 12.0–15.0)
MCH: 30 pg (ref 26.0–34.0)
MCHC: 33 g/dL (ref 30.0–36.0)
MCV: 90.9 fL (ref 78.0–100.0)
Platelets: 113 10*3/uL — ABNORMAL LOW (ref 150–400)
RBC: 3.97 MIL/uL (ref 3.87–5.11)
RDW: 14.6 % (ref 11.5–15.5)
WBC: 6.4 10*3/uL (ref 4.0–10.5)

## 2015-01-06 LAB — MAGNESIUM
MAGNESIUM: 1.6 mg/dL — AB (ref 1.7–2.4)
Magnesium: 1.6 mg/dL — ABNORMAL LOW (ref 1.7–2.4)

## 2015-01-06 LAB — BASIC METABOLIC PANEL
Anion gap: 9 (ref 5–15)
BUN: 14 mg/dL (ref 6–20)
CHLORIDE: 117 mmol/L — AB (ref 101–111)
CO2: 16 mmol/L — ABNORMAL LOW (ref 22–32)
Calcium: 8.8 mg/dL — ABNORMAL LOW (ref 8.9–10.3)
Creatinine, Ser: 1.13 mg/dL — ABNORMAL HIGH (ref 0.44–1.00)
GFR calc Af Amer: 48 mL/min — ABNORMAL LOW (ref >60)
GFR, EST NON AFRICAN AMERICAN: 41 mL/min — AB (ref >60)
GLUCOSE: 111 mg/dL — AB (ref 65–99)
POTASSIUM: 3.9 mmol/L (ref 3.5–5.1)
Sodium: 142 mmol/L (ref 135–145)

## 2015-01-06 LAB — PROTIME-INR
INR: 1.36 (ref 0.00–1.49)
Prothrombin Time: 16.9 seconds — ABNORMAL HIGH (ref 11.6–15.2)

## 2015-01-06 LAB — BRAIN NATRIURETIC PEPTIDE: B NATRIURETIC PEPTIDE 5: 712.6 pg/mL — AB (ref 0.0–100.0)

## 2015-01-06 LAB — PROCALCITONIN

## 2015-01-06 LAB — LIPID PANEL
Cholesterol: 132 mg/dL (ref 0–200)
HDL: 48 mg/dL (ref >40)
LDL CALC: 68 mg/dL (ref 0–99)
Total CHOL/HDL Ratio: 2.8 RATIO
Triglycerides: 81 mg/dL (ref <150)
VLDL: 16 mg/dL (ref 0–40)

## 2015-01-06 LAB — MRSA PCR SCREENING: MRSA by PCR: NEGATIVE

## 2015-01-06 LAB — PHOSPHORUS: PHOSPHORUS: 2.9 mg/dL (ref 2.5–4.6)

## 2015-01-06 LAB — TSH: TSH: 18.166 u[IU]/mL — ABNORMAL HIGH (ref 0.350–4.500)

## 2015-01-06 LAB — LACTIC ACID, PLASMA: LACTIC ACID, VENOUS: 6.2 mmol/L — AB (ref 0.5–2.0)

## 2015-01-06 LAB — APTT: APTT: 29 s (ref 24–37)

## 2015-01-06 SURGERY — PPM/BIV PPM GENERATOR CHANGEOUT
Anesthesia: LOCAL

## 2015-01-06 MED ORDER — ISOSORB DINITRATE-HYDRALAZINE 20-37.5 MG PO TABS
1.0000 | ORAL_TABLET | Freq: Two times a day (BID) | ORAL | Status: DC
  Administered 2015-01-06 – 2015-01-08 (×3): 1 via ORAL
  Filled 2015-01-06 (×4): qty 1

## 2015-01-06 MED ORDER — PHENAZOPYRIDINE HCL 100 MG PO TABS
100.0000 mg | ORAL_TABLET | Freq: Three times a day (TID) | ORAL | Status: DC
  Administered 2015-01-06: 100 mg via ORAL
  Filled 2015-01-06 (×5): qty 1

## 2015-01-06 MED ORDER — LIDOCAINE HCL (PF) 1 % IJ SOLN
INTRAMUSCULAR | Status: AC
  Filled 2015-01-06: qty 60

## 2015-01-06 MED ORDER — ONDANSETRON HCL 4 MG/2ML IJ SOLN: 4.0000 mg | Freq: Four times a day (QID) | INTRAMUSCULAR | Status: DC | PRN

## 2015-01-06 MED ORDER — PANTOPRAZOLE SODIUM 40 MG PO TBEC
40.0000 mg | DELAYED_RELEASE_TABLET | Freq: Every day | ORAL | Status: DC
  Administered 2015-01-08: 40 mg via ORAL
  Filled 2015-01-06 (×2): qty 1

## 2015-01-06 MED ORDER — POLYETHYLENE GLYCOL 3350 17 G PO PACK
17.0000 g | PACK | Freq: Every day | ORAL | Status: DC
  Administered 2015-01-08: 17 g via ORAL
  Filled 2015-01-06 (×2): qty 1

## 2015-01-06 MED ORDER — ACETAMINOPHEN 325 MG PO TABS
325.0000 mg | ORAL_TABLET | ORAL | Status: DC | PRN
  Administered 2015-01-06: 650 mg via ORAL
  Filled 2015-01-06: qty 2

## 2015-01-06 MED ORDER — CEFAZOLIN SODIUM 1-5 GM-% IV SOLN
1.0000 g | Freq: Four times a day (QID) | INTRAVENOUS | Status: AC
  Administered 2015-01-06: 1 g via INTRAVENOUS
  Filled 2015-01-06: qty 50

## 2015-01-06 MED ORDER — LEVOTHYROXINE SODIUM 88 MCG PO TABS
88.0000 ug | ORAL_TABLET | Freq: Every day | ORAL | Status: DC
  Filled 2015-01-06: qty 1

## 2015-01-06 MED ORDER — SODIUM CHLORIDE 0.9 % IR SOLN
Status: AC
  Filled 2015-01-06: qty 2

## 2015-01-06 MED ORDER — METOPROLOL TARTRATE 1 MG/ML IV SOLN
INTRAVENOUS | Status: DC | PRN
  Administered 2015-01-06: 5 mg via INTRAVENOUS

## 2015-01-06 MED ORDER — LIDOCAINE HCL (PF) 1 % IJ SOLN
INTRAMUSCULAR | Status: DC | PRN
  Administered 2015-01-06: 40 mL via INTRADERMAL

## 2015-01-06 MED ORDER — ASPIRIN EC 81 MG PO TBEC: 81.0000 mg | DELAYED_RELEASE_TABLET | Freq: Every day | ORAL | Status: DC

## 2015-01-06 MED ORDER — METOPROLOL TARTRATE 1 MG/ML IV SOLN
INTRAVENOUS | Status: AC
  Filled 2015-01-06: qty 5

## 2015-01-06 MED ORDER — ACETAMINOPHEN 325 MG PO TABS: 650.0000 mg | ORAL_TABLET | ORAL | Status: DC | PRN

## 2015-01-06 MED ORDER — HEPARIN (PORCINE) IN NACL 2-0.9 UNIT/ML-% IJ SOLN
INTRAMUSCULAR | Status: AC
  Filled 2015-01-06: qty 1000

## 2015-01-06 MED ORDER — CHLORHEXIDINE GLUCONATE 4 % EX LIQD
CUTANEOUS | Status: AC
  Administered 2015-01-06: 09:00:00
  Filled 2015-01-06: qty 30

## 2015-01-06 MED ORDER — DEXTROSE 5 % IV SOLN
1.0000 g | INTRAVENOUS | Status: DC
  Administered 2015-01-06 – 2015-01-07 (×2): 1 g via INTRAVENOUS
  Filled 2015-01-06 (×4): qty 10

## 2015-01-06 MED ORDER — NITROGLYCERIN 0.4 MG SL SUBL: 0.4000 mg | SUBLINGUAL_TABLET | SUBLINGUAL | Status: DC | PRN

## 2015-01-06 MED ORDER — LEVETIRACETAM 500 MG PO TABS
500.0000 mg | ORAL_TABLET | Freq: Two times a day (BID) | ORAL | Status: DC
  Administered 2015-01-06: 500 mg via ORAL
  Filled 2015-01-06 (×2): qty 1

## 2015-01-06 MED ORDER — ADULT MULTIVITAMIN W/MINERALS CH
1.0000 | ORAL_TABLET | Freq: Every day | ORAL | Status: DC
  Administered 2015-01-08: 1 via ORAL
  Filled 2015-01-06 (×2): qty 1

## 2015-01-06 MED ORDER — ONDANSETRON HCL 4 MG/2ML IJ SOLN
4.0000 mg | Freq: Four times a day (QID) | INTRAMUSCULAR | Status: DC | PRN
Start: 2015-01-06 — End: 2015-01-06

## 2015-01-06 MED ORDER — AMIODARONE IV BOLUS ONLY 150 MG/100ML
150.0000 mg | Freq: Once | INTRAVENOUS | Status: AC
  Administered 2015-01-06: 150 mg via INTRAVENOUS

## 2015-01-06 MED ORDER — CEFAZOLIN SODIUM-DEXTROSE 2-3 GM-% IV SOLR
INTRAVENOUS | Status: DC | PRN
  Administered 2015-01-06: 2 g via INTRAVENOUS

## 2015-01-06 MED ORDER — LORATADINE 10 MG PO TABS
10.0000 mg | ORAL_TABLET | Freq: Every day | ORAL | Status: DC
  Administered 2015-01-08: 10 mg via ORAL
  Filled 2015-01-06 (×2): qty 1

## 2015-01-06 MED ORDER — SENNOSIDES-DOCUSATE SODIUM 8.6-50 MG PO TABS
1.0000 | ORAL_TABLET | Freq: Two times a day (BID) | ORAL | Status: DC
  Administered 2015-01-06 – 2015-01-08 (×2): 1 via ORAL
  Filled 2015-01-06 (×3): qty 1

## 2015-01-06 MED ORDER — VITAMIN B-12 1000 MCG PO TABS
1500.0000 ug | ORAL_TABLET | Freq: Every day | ORAL | Status: DC
Start: 2015-01-06 — End: 2015-01-08
  Administered 2015-01-08: 1500 ug via ORAL
  Filled 2015-01-06 (×2): qty 3
  Filled 2015-01-06: qty 2
  Filled 2015-01-06: qty 3

## 2015-01-06 MED ORDER — CALCIUM CARBONATE 1250 (500 CA) MG PO TABS
1250.0000 mg | ORAL_TABLET | Freq: Every day | ORAL | Status: DC
  Administered 2015-01-08: 1250 mg via ORAL
  Filled 2015-01-06 (×2): qty 1

## 2015-01-06 MED ORDER — CETYLPYRIDINIUM CHLORIDE 0.05 % MT LIQD
7.0000 mL | Freq: Two times a day (BID) | OROMUCOSAL | Status: DC
  Administered 2015-01-06 – 2015-01-08 (×3): 7 mL via OROMUCOSAL

## 2015-01-06 SURGICAL SUPPLY — 5 items
CABLE SURGICAL S-101-97-12 (CABLE) ×2 IMPLANT
PACEMAKER ESSENTIO DR L101 (Pacemaker) ×1 IMPLANT
PAD DEFIB LIFELINK (PAD) ×2 IMPLANT
PPM ESSENTIO DR L101 (Pacemaker) ×2 IMPLANT
TRAY PACEMAKER INSERTION (PACKS) ×2 IMPLANT

## 2015-01-06 NOTE — Progress Notes (Addendum)
ANTIBIOTIC CONSULT NOTE - INITIAL  Pharmacy Consult for Ceftriaxone  Indication: UTI  No Known Allergies  Patient Measurements: Height: 5\' 4"  (162.6 cm) Weight: 164 lb 14.5 oz (74.8 kg) IBW/kg (Calculated) : 54.7  Vital Signs: Temp: 98.4 F (36.9 C) (11/26 2230) Temp Source: Oral (11/26 2230) BP: 147/50 mmHg (11/26 2345) Pulse Rate: 71 (11/26 2345)    Microbiology: Recent Results (from the past 720 hour(s))  MRSA PCR Screening     Status: None   Collection Time: 01/05/15 10:25 PM  Result Value Ref Range Status   MRSA by PCR NEGATIVE NEGATIVE Final    Comment:        The GeneXpert MRSA Assay (FDA approved for NASAL specimens only), is one component of a comprehensive MRSA colonization surveillance program. It is not intended to diagnose MRSA infection nor to guide or monitor treatment for MRSA infections.     Medical History: Past Medical History  Diagnosis Date  . Sick sinus syndrome     s/p PPM by Dr Olevia Perches (MDT)  . Hypertension   . FHx: mastectomy     for breast cancer  . Persistent atrial fibrillation     on pradaxa  . Gastro - esophageal reflux disease   . Allergic rhinitis due to pollen   . Other specified acquired hypothyroidism   . Body mass index 28.0-28.9, adult   . Other and unspecified hyperlipidemia   . Encounter for long-term (current) use of other medications   . Other B-complex deficiencies   . Memory loss   . Pain in limb   . Dizziness and giddiness   . Diastolic dysfunction   . Chronic anticoagulation    Assessment: 81 y/o F transfer from [place] for cardiac issues, starting Ceftriaxone per pharmacy for UTI, pt received a dose of ceftriaxone earlier tonight at [place].  Plan:  -Ceftriaxone 1g IV q24h -F/U urine cultre  Narda Bonds 01/06/2015,12:29 AM   ==================  Addendum: Pharmacy will sign off and follow peripherally.  Thank you for the consult!   Nissim Fleischer D. Mina Marble, PharmD, BCPS Pager:  [phone] 01/06/2015, 7:46 AM

## 2015-01-06 NOTE — Interval H&P Note (Signed)
History and Physical Interval Note:  01/06/2015 9:53 AM  [name]  has presented today for surgery, with the diagnosis of ERI  The various methods of treatment have been discussed with the patient and family. After consideration of risks, benefits and other options for treatment, the patient has consented to  Procedure(s): PPM/BIV PPM Generator Changeout (N/A) as a surgical intervention .  The patient's history has been reviewed, patient examined, no change in status, stable for surgery.  I have reviewed the patient's chart and labs.  Questions were answered to the patient's satisfaction.     Mikle Bosworth.D.

## 2015-01-06 NOTE — Progress Notes (Signed)
01/06/2015 12:38 AM  Lactic acid level 6.2 given to Dr. Guadalupe Maple, [place] Comment

## 2015-01-06 NOTE — Progress Notes (Signed)
Utilization review completed.  

## 2015-01-06 NOTE — Progress Notes (Signed)
Patient ID: [name], female   DOB: 10-Jun-1921, 81 y.o.   MRN: [id number]  Reason for Consult:VF in the setting of tdP  Referring Physician: Dr. Jaclyn Shaggy is an 81 y.o. female.   HPI: The patient is a 81 yo woman with symptomatic bradycardia and atrial fib who was lost to followup and presented with syncope and found to have long pauses and tdP secondary to her pauses. Her PPM is nonfunctioning. It was known to have 2 years of battery longevity 2.5 years ago at her last followup. She lives in a group home and her son who had been her caretaker has had throat CA and multiple complications and has not been able to help in her care. She presented on Isuprel IV after having pause induced VF. She denies chest pain or sob. She fell several days ago and has an ecchymotic area over her chest.  PMH: Past Medical History  Diagnosis Date  . Sick sinus syndrome [facility])     s/p PPM by Dr Olevia Perches (MDT)  . Hypertension   . FHx: mastectomy     for breast cancer  . Persistent atrial fibrillation ([place])     on pradaxa  . Gastro - esophageal reflux disease   . Allergic rhinitis due to pollen   . Other specified acquired hypothyroidism   . Body mass index 28.0-28.9, adult   . Other and unspecified hyperlipidemia   . Encounter for long-term (current) use of other medications   . Other B-complex deficiencies   . Memory loss   . Pain in limb   . Dizziness and giddiness   . Diastolic dysfunction   . Chronic anticoagulation   . Presence of permanent cardiac pacemaker     PSHX: Past Surgical History  Procedure Laterality Date  . Pacemaker insertion      by Dr Olevia Perches for tachy/brady syndrome (MDT)  . Mastectomy    . Tonsillectomy    . Appendectomy    . Embolectomy Left 12/30/2012    Procedure: EMBOLECTOMY LEFT LEG, with patch angioplasty;  Surgeon: Serafina Mitchell, MD;  Location: [facility] OR;  Service: Vascular;  Laterality: Left;  . Embolectomy Right 02/05/2014    Procedure: Right  Brachial, Axillary, Ulnar and Radial Embolectomy;  Surgeon: Elam Dutch, MD;  Location: [facility] OR;  Service: Vascular;  Laterality: Right;    FAMHX: Family History  Problem Relation Age of Onset  . Heart disease Maternal Grandfather   . Diabetes Sister   . Tuberculosis Mother     father  . Diabetes Brother   . Stroke Brother     Social History:  reports that she has never smoked. She does not have any smokeless tobacco history on file. She reports that she does not drink alcohol or use illicit drugs.  Allergies: No Known Allergies  Medications: I have reviewed the patient's current medications.  No results found.  ROS  As stated in the HPI and negative for all other systems.  Physical Exam  Vitals:Blood pressure 134/62, pulse 29, temperature 98.1 F (36.7 C), temperature source Oral, resp. rate 35, height 5\' 4"  (1.626 m), weight 163 lb 12.8 oz (74.3 kg), SpO2 98 %.  elderly appearing woman NAD HEENT: Unremarkable Neck:  No JVD, no thyromegally Lymphatics:  No adenopathy Back:  No CVA tenderness Lungs:  Clear with no wheezes HEART:  Regular rate rhythm, no murmurs, no rubs, no clicks Abd:  soft, positive bowel sounds, no organomegally, no rebound, no guarding Ext:  2 plus pulses, no edema, no cyanosis, no clubbing Skin:  No rashes no nodules Neuro:  CN II through XII intact, motor grossly intact  ECG - atrial fib with a slow VR and multiple PVC's  Tele - bradycardia with dense ventricular ectopy  Assessment/Plan: 1. Pause induced VF 2. Symptomatic bradycardia 3. Dead PPM 4. Atrial fib 5. Remote emboli to right arm Rec: I have discussed the treatment options. The patient will need a new PM. I will try and get this accomplished this morning rather than placing a temporary PM.   Carleene Overlie TaylorMD 01/06/2015, 8:16 AM

## 2015-01-06 NOTE — Progress Notes (Signed)
01/06/2015 1:12 AM [employer] rep here to evaluate pacer. Gared Gillie, [place] Comment

## 2015-01-06 NOTE — H&P (View-Only) (Signed)
Patient ID: [name], female   DOB: 30-Apr-1921, 81 y.o.   MRN: [id number]  Reason for Consult:VF in the setting of tdP  Referring Physician: Dr. Jaclyn Shaggy is an 81 y.o. female.   HPI: The patient is a 81 yo woman with symptomatic bradycardia and atrial fib who was lost to followup and presented with syncope and found to have long pauses and tdP secondary to her pauses. Her PPM is nonfunctioning. It was known to have 2 years of battery longevity 2.5 years ago at her last followup. She lives in a group home and her son who had been her caretaker has had throat CA and multiple complications and has not been able to help in her care. She presented on Isuprel IV after having pause induced VF. She denies chest pain or sob. She fell several days ago and has an ecchymotic area over her chest.  PMH: Past Medical History  Diagnosis Date  . Sick sinus syndrome [facility])     s/p PPM by Dr Olevia Perches (MDT)  . Hypertension   . FHx: mastectomy     for breast cancer  . Persistent atrial fibrillation ([place])     on pradaxa  . Gastro - esophageal reflux disease   . Allergic rhinitis due to pollen   . Other specified acquired hypothyroidism   . Body mass index 28.0-28.9, adult   . Other and unspecified hyperlipidemia   . Encounter for long-term (current) use of other medications   . Other B-complex deficiencies   . Memory loss   . Pain in limb   . Dizziness and giddiness   . Diastolic dysfunction   . Chronic anticoagulation   . Presence of permanent cardiac pacemaker     PSHX: Past Surgical History  Procedure Laterality Date  . Pacemaker insertion      by Dr Olevia Perches for tachy/brady syndrome (MDT)  . Mastectomy    . Tonsillectomy    . Appendectomy    . Embolectomy Left 12/30/2012    Procedure: EMBOLECTOMY LEFT LEG, with patch angioplasty;  Surgeon: Serafina Mitchell, MD;  Location: [facility] OR;  Service: Vascular;  Laterality: Left;  . Embolectomy Right 02/05/2014    Procedure: Right  Brachial, Axillary, Ulnar and Radial Embolectomy;  Surgeon: Elam Dutch, MD;  Location: [facility] OR;  Service: Vascular;  Laterality: Right;    FAMHX: Family History  Problem Relation Age of Onset  . Heart disease Maternal Grandfather   . Diabetes Sister   . Tuberculosis Mother     father  . Diabetes Brother   . Stroke Brother     Social History:  reports that she has never smoked. She does not have any smokeless tobacco history on file. She reports that she does not drink alcohol or use illicit drugs.  Allergies: No Known Allergies  Medications: I have reviewed the patient's current medications.  No results found.  ROS  As stated in the HPI and negative for all other systems.  Physical Exam  Vitals:Blood pressure 134/62, pulse 29, temperature 98.1 F (36.7 C), temperature source Oral, resp. rate 35, height 5\' 4"  (1.626 m), weight 163 lb 12.8 oz (74.3 kg), SpO2 98 %.  elderly appearing woman NAD HEENT: Unremarkable Neck:  No JVD, no thyromegally Lymphatics:  No adenopathy Back:  No CVA tenderness Lungs:  Clear with no wheezes HEART:  Regular rate rhythm, no murmurs, no rubs, no clicks Abd:  soft, positive bowel sounds, no organomegally, no rebound, no guarding Ext:  2 plus pulses, no edema, no cyanosis, no clubbing Skin:  No rashes no nodules Neuro:  CN II through XII intact, motor grossly intact  ECG - atrial fib with a slow VR and multiple PVC's  Tele - bradycardia with dense ventricular ectopy  Assessment/Plan: 1. Pause induced VF 2. Symptomatic bradycardia 3. Dead PPM 4. Atrial fib 5. Remote emboli to right arm Rec: I have discussed the treatment options. The patient will need a new PM. I will try and get this accomplished this morning rather than placing a temporary PM.   Carleene Overlie TaylorMD 01/06/2015, 8:16 AM

## 2015-01-06 NOTE — Progress Notes (Signed)
EKG CRITICAL VALUE     12 lead EKG performed.  Critical value noted..,Diannia Ruder RN notified.   Yehuda Mao, [place] 01/06/2015 7:56 AM

## 2015-01-07 ENCOUNTER — Encounter (HOSPITAL_COMMUNITY): Payer: Self-pay | Admitting: Physician Assistant

## 2015-01-07 ENCOUNTER — Inpatient Hospital Stay (HOSPITAL_COMMUNITY): Payer: Commercial Managed Care - HMO

## 2015-01-07 DIAGNOSIS — G9340 Encephalopathy, unspecified: Secondary | ICD-10-CM

## 2015-01-07 DIAGNOSIS — I10 Essential (primary) hypertension: Secondary | ICD-10-CM

## 2015-01-07 LAB — MAGNESIUM: MAGNESIUM: 1.9 mg/dL (ref 1.7–2.4)

## 2015-01-07 LAB — BASIC METABOLIC PANEL
Anion gap: 9 (ref 5–15)
BUN: 10 mg/dL (ref 6–20)
CHLORIDE: 110 mmol/L (ref 101–111)
CO2: 21 mmol/L — AB (ref 22–32)
Calcium: 8.9 mg/dL (ref 8.9–10.3)
Creatinine, Ser: 1.2 mg/dL — ABNORMAL HIGH (ref 0.44–1.00)
GFR calc non Af Amer: 38 mL/min — ABNORMAL LOW (ref >60)
GFR, EST AFRICAN AMERICAN: 44 mL/min — AB (ref >60)
Glucose, Bld: 98 mg/dL (ref 65–99)
POTASSIUM: 3.2 mmol/L — AB (ref 3.5–5.1)
SODIUM: 140 mmol/L (ref 135–145)

## 2015-01-07 LAB — URINALYSIS, ROUTINE W REFLEX MICROSCOPIC
Bilirubin Urine: NEGATIVE
Glucose, UA: NEGATIVE mg/dL
Hgb urine dipstick: NEGATIVE
KETONES UR: 15 mg/dL — AB
NITRITE: POSITIVE — AB
PROTEIN: NEGATIVE mg/dL
Specific Gravity, Urine: 1.016 (ref 1.005–1.030)
pH: 5.5 (ref 5.0–8.0)

## 2015-01-07 LAB — HEMOGLOBIN A1C
Hgb A1c MFr Bld: 6 % — ABNORMAL HIGH (ref 4.8–5.6)
Mean Plasma Glucose: 126 mg/dL

## 2015-01-07 LAB — URINE MICROSCOPIC-ADD ON: RBC / HPF: NONE SEEN RBC/hpf (ref 0–5)

## 2015-01-07 LAB — T4, FREE: FREE T4: 0.47 ng/dL — AB (ref 0.61–1.12)

## 2015-01-07 LAB — URINE CULTURE: CULTURE: NO GROWTH

## 2015-01-07 LAB — PROCALCITONIN: Procalcitonin: <0.1 ng/mL

## 2015-01-07 MED ORDER — SODIUM CHLORIDE 0.9 % IV SOLN
INTRAVENOUS | Status: DC
  Administered 2015-01-07: 17:00:00 via INTRAVENOUS

## 2015-01-07 MED ORDER — MIRTAZAPINE 15 MG PO TABS: 15.0000 mg | ORAL_TABLET | Freq: Every day | ORAL | Status: DC

## 2015-01-07 MED ORDER — HALOPERIDOL LACTATE 5 MG/ML IJ SOLN
1.0000 mg | INTRAMUSCULAR | Status: DC | PRN
  Administered 2015-01-07: 4 mg via INTRAVENOUS
  Filled 2015-01-07 (×2): qty 1

## 2015-01-07 MED ORDER — LEVETIRACETAM 500 MG/5ML IV SOLN
500.0000 mg | Freq: Two times a day (BID) | INTRAVENOUS | Status: DC
  Administered 2015-01-07 (×2): 500 mg via INTRAVENOUS
  Filled 2015-01-07 (×4): qty 5

## 2015-01-07 MED ORDER — MIRTAZAPINE 15 MG PO TABS
15.0000 mg | ORAL_TABLET | Freq: Every day | ORAL | Status: DC
  Administered 2015-01-07: 15 mg via ORAL
  Filled 2015-01-07 (×2): qty 1

## 2015-01-07 MED ORDER — LEVOTHYROXINE SODIUM 100 MCG IV SOLR
50.0000 ug | Freq: Every day | INTRAVENOUS | Status: DC
  Administered 2015-01-08: 50 ug via INTRAVENOUS
  Filled 2015-01-07 (×3): qty 5

## 2015-01-07 MED ORDER — ENSURE ENLIVE PO LIQD
237.0000 mL | Freq: Two times a day (BID) | ORAL | Status: DC
  Administered 2015-01-07 – 2015-01-08 (×2): 237 mL via ORAL

## 2015-01-07 MED ORDER — POTASSIUM CHLORIDE 10 MEQ/100ML IV SOLN
10.0000 meq | INTRAVENOUS | Status: AC
  Administered 2015-01-07 (×3): 10 meq via INTRAVENOUS
  Filled 2015-01-07 (×2): qty 100

## 2015-01-07 MED ORDER — HALOPERIDOL LACTATE 5 MG/ML IJ SOLN
5.0000 mg | Freq: Once | INTRAMUSCULAR | Status: AC
  Administered 2015-01-07: 5 mg via INTRAVENOUS

## 2015-01-07 MED ORDER — DEXTROSE 5 % IV SOLN
INTRAVENOUS | Status: DC
  Administered 2015-01-07 – 2015-01-08 (×2): via INTRAVENOUS

## 2015-01-07 MED FILL — Heparin Sodium (Porcine) 2 Unit/ML in Sodium Chloride 0.9%: INTRAMUSCULAR | Qty: 500 | Status: AC

## 2015-01-07 MED FILL — Gentamicin Sulfate Inj 40 MG/ML: INTRAMUSCULAR | Qty: 2 | Status: AC

## 2015-01-07 MED FILL — Sodium Chloride Irrigation Soln 0.9%: Qty: 500 | Status: AC

## 2015-01-07 NOTE — Progress Notes (Signed)
01/07/2015 4:41 AM Haldol 5mg  IV given for extreme agitation. Telesa Jeancharles, Carolynn Comment

## 2015-01-07 NOTE — Progress Notes (Signed)
Pt refusing to allow nursing to place cardiac monitor, pulling out IVs, and combative when staff attempts to approach pt.  Tommye Standard PA-C notified and is aware pt is not currently being monitored.  Will continue to monitor.

## 2015-01-07 NOTE — Progress Notes (Signed)
Initial Nutrition Assessment  DOCUMENTATION CODES:   Not applicable  INTERVENTION:   Ensure Enlive po BID, each supplement provides 350 kcal and 20 grams of protein  NUTRITION DIAGNOSIS:   Inadequate oral intake related to lethargy/confusion as evidenced by meal completion < 50%.  GOAL:   Patient will meet greater than or equal to 90% of their needs  MONITOR:   PO intake, Supplement acceptance, Labs, Weight trends, Skin, I & O's  REASON FOR ASSESSMENT:   Malnutrition Screening Tool    ASSESSMENT:   The patient is a 81 yo woman with symptomatic bradycardia and atrial fib who was lost to followup and presented with syncope and found to have long pauses and tdP secondary to her pauses. Her PPM is nonfunctioning. It was known to have 2 years of battery longevity 2.5 years ago at her last followup. She lives in a group home and her son who had been her caretaker has had throat CA and multiple complications and has not been able to help in her care. She presented on Isuprel IV after having pause induced VF. She denies chest pain or sob. She fell several days ago and has an ecchymotic area over her chest.  Pt admitted with pause induced VF.   S/p Procedure(s) on 01/06/15: PPM/BIV PPM Generator Changeout (N/A)  Staff report that pt has been very confused, agitated, and combative. Per chart review, pt refused echocardiogram earlier this AM, because she refused to be touched.   RN reports that pt is awaiting transfer to cardiac unit. Pt was unable to take AM meds secondary to combativeness. Unable to complete Nutrition-Focused physical exam at this time.   Meal completion is poor (PO: 0-50%), likely due to agitation.   Wt hx reviewed. UBW around 162#. Per wt hx, pt has hx of distant wt loss, however, has returned to UBW over the past year.   Labs reviewed.   Diet Order:  Diet Heart Room service appropriate?: Yes; Fluid consistency:: Thin  Skin:  Reviewed, no issues  Last BM:   01/06/15  Height:   Ht Readings from Last 1 Encounters:  01/05/15 5\' 4"  (1.626 m)    Weight:   Wt Readings from Last 1 Encounters:  01/07/15 160 lb 11.5 oz (72.9 kg)    Ideal Body Weight:  54.5 kg  BMI:  Body mass index is 27.57 kg/(m^2).  Estimated Nutritional Needs:   Kcal:  1400-1600  Protein:  60-70 grams  Fluid:  1.4-1.6 L  EDUCATION NEEDS:   No education needs identified at this time  Mehul Rudin A. Jimmye Norman, RD, LDN, CDE Pager: [phone] After hours Pager: [phone]

## 2015-01-07 NOTE — CV Procedure (Signed)
Patient states that she doesn't want to be touched at this time, she  Asked me to please do not do the echocardiogram.

## 2015-01-07 NOTE — Progress Notes (Signed)
01/07/2015 5:21 AM  Less agitated but still pulling at wires and monitor. Pulled self off of monitor. Pt is currently being monitored on zoll. [name], Carolynn Comment

## 2015-01-07 NOTE — Progress Notes (Signed)
eLink Physician-Brief Progress Note Patient Name: [name] DOB: 1921-03-07 MRN: [id number]   Date of Service  01/07/2015  HPI/Events of Note  Confused, agitated  eICU Interventions  Haldol prn Resume lexpro, remeron when able     Intervention Category Major Interventions: Delirium, psychosis, severe agitation - evaluation and management  Thimothy Barretta V. 01/07/2015, 3:13 AM

## 2015-01-07 NOTE — Progress Notes (Signed)
Pt. Was combative when trying to give medications.  Was not able to give.  Pt. Was kicking and punching. Foley was discontinued.  Patient was left alone and is now resting.

## 2015-01-07 NOTE — Evaluation (Signed)
Physical Therapy Evaluation Patient Details Name: [name] MRN: [id number] DOB: 03/30/1921 Today's Date: 01/07/2015   History of Present Illness  The patient is a 81 yo woman with symptomatic bradycardia and atrial fib who was lost to followup and presented with syncope and found to have long pauses and tdP secondary to her pauses. Her PPM is nonfunctioning. It was known to have 2 years of battery longevity 2.5 years ago at her last followup. She lives in a group home and her son who had been her caretaker has had throat CA and multiple complications and has not been able to help in her care. She presented on Isuprel IV after having pause induced VF  Clinical Impression  Pt admitted with above diagnosis. Pt currently with functional limitations due to the deficits listed below (see PT Problem List). Eval limited due to pt not being monitored since she would not allow it earlier in the morning. Pt required mod A for bed mobiltiy and sit to stand, prevents with generalized weakness.  Pt will benefit from skilled PT to increase their independence and safety with mobility to allow discharge to the venue listed below.       Follow Up Recommendations SNF;Supervision/Assistance - 24 hour    Equipment Recommendations  None recommended by PT    Recommendations for Other Services OT consult     Precautions / Restrictions Precautions Precautions: Fall Precaution Comments: left clavicular fx from fall Restrictions Weight Bearing Restrictions: Yes      Mobility  Bed Mobility Overal bed mobility: Needs Assistance Bed Mobility: Supine to Sit;Sit to Supine     Supine to sit: Mod assist Sit to supine: Mod assist   General bed mobility comments: mod A due partially to left shoulder pain and weakness.   Transfers Overall transfer level: Needs assistance Equipment used: Rolling walker (2 wheeled) Transfers: Sit to/from Stand Sit to Stand: Mod assist         General transfer  comment: mod A for power up and to steady once up, posterior lean noted. Performed multiple times with difficulty each time  Ambulation/Gait             General Gait Details: unable to perform safely and pt's heart not currently being monitored because would not allow leads so eval limited  Stairs            Wheelchair Mobility    Modified Rankin (Stroke Patients Only)       Balance Overall balance assessment: Needs assistance;History of Falls Sitting-balance support: Bilateral upper extremity supported Sitting balance-Leahy Scale: Poor   Postural control: Posterior lean Standing balance support: Bilateral upper extremity supported Standing balance-Leahy Scale: Poor                               Pertinent Vitals/Pain Pain Assessment: Faces Faces Pain Scale: Hurts even more Pain Location: left shoulder with movement Pain Descriptors / Indicators: Aching Pain Intervention(s): Limited activity within patient's tolerance;Monitored during session;Repositioned    Home Living Family/patient expects to be discharged to:: Private residence Living Arrangements: Group Home Available Help at Discharge: Available 24 hours/day;Friend(s) Type of Home: Group Home           Additional Comments: info above from chart, pt unreliable historian and cannot give me accurate history PTA.     Prior Function Level of Independence: Needs assistance   Gait / Transfers Assistance Needed: uses RW for ambulation, has had multiple  falls, currently has left clavicular fx  ADL's / Homemaking Assistance Needed: unknown        Hand Dominance   Dominant Hand: Right    Extremity/Trunk Assessment   Upper Extremity Assessment: Defer to OT evaluation;Generalized weakness;LUE deficits/detail       LUE Deficits / Details: limited by pain   Lower Extremity Assessment: Generalized weakness      Cervical / Trunk Assessment: Kyphotic  Communication   Communication: No  difficulties  Cognition Arousal/Alertness: Awake/alert Behavior During Therapy: Agitated Overall Cognitive Status: Impaired/Different from baseline Area of Impairment: Following commands;Safety/judgement;Awareness;Problem solving;Memory;Orientation;Attention Orientation Level: Disoriented to;Situation;Time Current Attention Level: Sustained Memory: Decreased short-term memory;Decreased recall of precautions Following Commands: Follows one step commands consistently Safety/Judgement: Decreased awareness of safety;Decreased awareness of deficits   Problem Solving: Slow processing;Requires verbal cues;Difficulty sequencing General Comments: unsure of pt's baseline but has been combative and kicking, hitting people today. Is very upset because she says 4 women grabbed her OOB and brought her here. Calmed down with calm speech and eye level conversation though easily riled    General Comments General comments (skin integrity, edema, etc.): pt much calmer end of session    Exercises        Assessment/Plan    PT Assessment Patient needs continued PT services  PT Diagnosis Difficulty walking;Abnormality of gait;Generalized weakness;Acute pain   PT Problem List Decreased strength;Decreased activity tolerance;Decreased balance;Decreased mobility;Decreased cognition;Decreased knowledge of use of DME;Decreased knowledge of precautions;Decreased safety awareness;Pain;Cardiopulmonary status limiting activity  PT Treatment Interventions DME instruction;Gait training;Functional mobility training;Therapeutic activities;Therapeutic exercise;Balance training;Neuromuscular re-education;Cognitive remediation;Patient/family education   PT Goals (Current goals can be found in the Care Plan section) Acute Rehab PT Goals Patient Stated Goal: did not state PT Goal Formulation: With patient Time For Goal Achievement: 01/21/15 Potential to Achieve Goals: Good    Frequency Min 3X/week   Barriers to  discharge Decreased caregiver support      Co-evaluation               End of Session Equipment Utilized During Treatment: Gait belt Activity Tolerance: Treatment limited secondary to agitation;Treatment limited secondary to medical complications (Comment) (no monitors) Patient left: in bed;with bed alarm set;with call bell/phone within reach Nurse Communication: Mobility status         Time: 1340-1407 PT Time Calculation (min) (ACUTE ONLY): 27 min   Charges:   PT Evaluation $Initial PT Evaluation Tier I: 1 Procedure PT Treatments $Therapeutic Activity: 8-22 mins   PT G Codes:       Leighton Roach, PT  Acute Rehab Services  [phone]  Leighton Roach 01/07/2015, 2:24 PM

## 2015-01-07 NOTE — Progress Notes (Signed)
SUBJECTIVE: The patient became confused and aggitated overnight, she is s/p Haldol, wakes to verbal but unable to get ROS, confused.    Marland Kitchen amiodarone  150 mg Intravenous Once  . antiseptic oral rinse  7 mL Mouth Rinse BID  . aspirin EC  81 mg Oral Daily  . calcium carbonate  1,250 mg Oral Q breakfast  . cefTRIAXone (ROCEPHIN)  IV  1 g Intravenous Q24H  . cyanocobalamin  1,500 mcg Oral Daily  . isosorbide-hydrALAZINE  1 tablet Oral BID  . levETIRAcetam  500 mg Oral BID  . levothyroxine  88 mcg Oral QAC breakfast  . loratadine  10 mg Oral Daily  . multivitamin with minerals  1 tablet Oral Daily  . pantoprazole  40 mg Oral Daily  . phenazopyridine  100 mg Oral TID WC  . polyethylene glycol  17 g Oral Daily  . senna-docusate  1 tablet Oral BID   . sodium chloride 10 mL/hr at 01/06/15 0006  . amiodarone Stopped (01/06/15 2000)    OBJECTIVE: Physical Exam: Filed Vitals:   01/07/15 0400 01/07/15 0500 01/07/15 0545 01/07/15 0600  BP:      Pulse:      Temp:      TempSrc:      Resp: 28 24 16 22   Height:      Weight:      SpO2:        Intake/Output Summary (Last 24 hours) at 01/07/15 [id number] Last data filed at 01/07/15 0600  Gross per 24 hour  Intake  620.4 ml  Output   2360 ml  Net -1739.6 ml    Telemetry reveals AV paced when on telemetry, pt currently being monitored with zoll pads, regular rhythm, 80's  Exam is limited, pt confused, pushes away GEN- The patient is elderly, frail, wakes to verbal but confused Head- normocephalic, atraumatic Neck- supple Lungs- Clear to ausculation bilaterally, normal work of breathing Heart- Regular rate and rhythm, no significant murmurs, no rubs or gallops GI- soft Extremities- no clubbing, cyanosis, or edema Skin- no rash or lesion Neuro- wakes, moving all extremities Pacer dressing is dry  LABS: Basic Metabolic Panel:  Recent Labs  01/05/15 2338 01/06/15 0038 01/06/15 0702  NA  --  142 142  K  --  3.9 3.9  CL  --   113* 117*  CO2  --  16* 16*  GLUCOSE  --  247* 111*  BUN  --  14 14  CREATININE  --  1.40* 1.13*  CALCIUM  --  8.5* 8.8*  MG 1.6* 1.6*  --   PHOS 2.9  --   --    Liver Function Tests:  Recent Labs  01/06/15 0038  AST 52*  ALT 35  ALKPHOS 113  BILITOT 0.5  PROT 5.7*  ALBUMIN 3.3*   CBC:  Recent Labs  01/06/15 0702  WBC 6.4  HGB 11.9*  HCT 36.1  MCV 90.9  PLT 113*   Cardiac Enzymes:  Recent Labs  01/06/15 0038 01/06/15 0702 01/06/15 1300  TROPONINI 0.12* 0.14* 0.12*   Fasting Lipid Panel:  Recent Labs  01/06/15 0702  CHOL 132  HDL 48  LDLCALC 68  TRIG 81  CHOLHDL 2.8   Thyroid Function Tests:  Recent Labs  01/06/15 0143  TSH 18.166*   02/07/14: Echocardiogram Study Conclusions - Procedure narrative: Transthoracic echocardiography. Image quality was fair. The study was technically difficult, as a result of poor patient compliance. - Left ventricle: The cavity size was normal.  Wall thickness was increased in a pattern of mild LVH. Systolic function was normal. The estimated ejection fraction was in the range of 55% to 60%. Septal-lateral dyssynchrony likely from RV pacing. Indeterminant diastolic function. - Aortic valve: There was no stenosis. - Mitral valve: Mildly to moderately calcified annulus. There was trivial regurgitation. - Left atrium: The atrium was mildly dilated. - Right ventricle: The cavity size was normal. Pacer wire or catheter noted in right ventricle. Systolic function was normal. - Pulmonary arteries: PA peak pressure: 32 mm Hg (S). - Pericardium, extracardiac: Mild to moderate pericardial effusion, most prominent inferolaterally. Impressions: - Echo limited, patient was uncooperative.  RADIOLOGY: Dg Shoulder Left 01/06/2015  CLINICAL DATA:  Fall 2 weeks ago.  Pain with abduction. EXAM: LEFT SHOULDER - 2+ VIEW COMPARISON:  Left shoulder radiographs 05/08/2012 FINDINGS: The left shoulder is located.  A lucency in the distal clavicle is compatible with a nondisplaced fracture. No other acute bone or soft tissue abnormality is present. A pacemaker is stable in position. A pacing/ defibrillator pad is noted as well. IMPRESSION: 1. Nondisplaced fracture in the distal clavicle. 2. The glenohumeral joint is located. 3. The pacemaker is stable. Electronically Signed   By: San Morelle M.D.   On: 01/06/2015 17:23    ASSESSMENT AND PLAN:  1. Pause induced VF     Received 2 shocks In the ED in [place], by chart review she was given the following for bradycardia. She was initiated in on isoproterenol drip (which was d/c'd on arrival here.) Atropine 1mg  X 3 given NS 1L bolus X 2 given Amiodarone gtt stopped  2. Symptomatic bradycardia, PPM battery dead, pt lost to f/u since 05/30/2012      s/p PPM generator change 01/06/15 by Dr. Lovena Le with [employer] dual-chamber pacemaker, serial number [id number]      Check EKG today  Transfer out of ICU  3. Atrial fib, on full a/c until Oct. 2015 after a SAH/SDH     Dec [id number] embolic event to R RUE and out patient on Eliquis (low dose)     Recent fall with chest wall trauma (ecchymosis), clavicular fracture ?new     Will likely restart upon discharge, recent falls in the last couple days likely secondary to pacer failure  4. Confusion/aggitation     Required Haldol overnight, currently sleeping     Son at bedside, reports she lives in her own home, he and his brother are neighbors to her, though feels she will be be better in a nursing home from here on out, consult PT/OT, and social services D/c Haldol, avoid QT prolonging meds     Per IM notes:Radiology reports reviewed from [facility].  CXR  Cardiac enlargement without vascular congestion or edema, possible small right pleural effusion Ct head and spine No acute abnormalities CTA of the chest No PE  5. ?UTI      Will defer to medicine service  6. Abnormal Troponins     Likely  demand ischemia, not felt an ACS  7. L. Clavicular fracture (non-displaced)      Consult ortho  8. Abnormal TSH     On synthroid, deferred to IM   Tommye Standard, PA-C 01/07/2015 6:53 AM   I have seen, examined the patient, and reviewed the above assessment and plan.  On exam, sleeping but arouses.  Changes to above are made where necessary.  Arrhythmia resolved s/p PPM generator change.  Would avoid QT prolonging drugs including haldol.  Doubt she will  be safe to return home and will likely require SNF at discharge.  Will ask SW, PT, OT to assess.  No further EP management required.  Given confusion, may be better served on internal medicine service.  Co Sign: Thompson Grayer, MD 01/07/2015 2:00 PM

## 2015-01-07 NOTE — Consult Note (Signed)
[facility] PROGRESS NOTE  [name] [id number] DOB: Apr 27, 1921 DOA: 01/05/2015 PCP: Wende Neighbors, MD  Assessment/Plan: 81 year old with PMH sick sinus rhythm S/P PPM,  who hasn't had a device check since June 2014,  Presents from [place] [facility] with Symptomatic bradycardia and atrial fib, presents with syncope and long pauses, induce VF. She received Isoproterenol, patient went into VT and required shock. She was transfer to [facility], ICU level, cardiology assume her care from [facility]. Patient had pacer pads in place. She subsequently underwent new pacemaker placement.  We were ask to see patient again due to confusion, and agitation.  Patient was aggiated overnight, she has been refusing medications. She has received Haldol overnight.  Patient is feeling anxious, denies chest pain or dyspnea.   1-Encephalopathy, delirium;  This could be hospital delirium, vs secondary to infectious.  Continue with ceftriaxone. Check Electrolytes. Resume IV synthroid.  Will resume Remeron.   2-UTI; She has been on ceftriaxone since 11-27.  Per HPI, at [place] patient had UA with pyuria.  Repeat UA.  Follow urine culture.   3-Symptomatic Bradycardia, VF; S/P new pacemaker. Per cardiology  4-Metabolic acidosis; Repeat labs. IV fluids.   5-Hypothyroidism; change synthroid to IV. Patient has been refusing oral.  Markedly elevated TSH. Check free T 3 and Free T4.   6-Keppra: patient is on keppra, unclear if it is for seizure. I will change it to IV.  Code Status: Full Code.  Family Communication: no family at bedside.  Disposition Plan: Expect discharge to SNF, when stable.    Procedures:  ECHO;   Antibiotics:  Ceftriaxone 11-27  HPI/Subjective: Patient was alert, oriented to place. She is anxious. She denies dyspnea, cough, no abdominal pain.  She has been refusing her medications. She was confused overnight, she required haldol overnight.    Objective: Filed Vitals:   01/07/15 0545 01/07/15 0600  BP:    Pulse:    Temp:    Resp: 16 22    Intake/Output Summary (Last 24 hours) at 01/07/15 1357 Last data filed at 01/07/15 1000  Gross per 24 hour  Intake  473.6 ml  Output   2165 ml  Net -1691.4 ml   Filed Weights   01/05/15 2230 01/06/15 0500 01/07/15 0251  Weight: 74.8 kg (164 lb 14.5 oz) 74.3 kg (163 lb 12.8 oz) 72.9 kg (160 lb 11.5 oz)    Exam:   General:  Alert, anxious, tremors in hands  Cardiovascular: S 1, S 2 RRR  Respiratory: No wheezing, mild crackles.   Abdomen: BS present, soft, nt  Musculoskeletal: no edema   Data Reviewed: Basic Metabolic Panel:  Recent Labs Lab 01/05/15 2338 01/06/15 0038 01/06/15 0702  NA  --  142 142  K  --  3.9 3.9  CL  --  113* 117*  CO2  --  16* 16*  GLUCOSE  --  247* 111*  BUN  --  14 14  CREATININE  --  1.40* 1.13*  CALCIUM  --  8.5* 8.8*  MG 1.6* 1.6*  --   PHOS 2.9  --   --    Liver Function Tests:  Recent Labs Lab 01/06/15 0038  AST 52*  ALT 35  ALKPHOS 113  BILITOT 0.5  PROT 5.7*  ALBUMIN 3.3*   No results for input(s): LIPASE, AMYLASE in the last 168 hours. No results for input(s): AMMONIA in the last 168 hours. CBC:  Recent Labs Lab 01/06/15 0702  WBC 6.4  HGB 11.9*  HCT 36.1  MCV 90.9  PLT 113*   Cardiac Enzymes:  Recent Labs Lab 01/05/15 2338 01/06/15 0038 01/06/15 0702 01/06/15 1300  TROPONINI 0.13* 0.12* 0.14* 0.12*   BNP (last 3 results)  Recent Labs  01/06/15 0038  BNP 712.6*    ProBNP (last 3 results) No results for input(s): PROBNP in the last 8760 hours.  CBG: No results for input(s): GLUCAP in the last 168 hours.  Recent Results (from the past 240 hour(s))  MRSA PCR Screening     Status: None   Collection Time: 01/05/15 10:25 PM  Result Value Ref Range Status   MRSA by PCR NEGATIVE NEGATIVE Final    Comment:        The GeneXpert MRSA Assay (FDA approved for NASAL specimens only), is one component of a comprehensive MRSA  colonization surveillance program. It is not intended to diagnose MRSA infection nor to guide or monitor treatment for MRSA infections.   Culture, Urine     Status: None   Collection Time: 01/06/15 12:21 AM  Result Value Ref Range Status   Specimen Description URINE, CATHETERIZED  Final   Special Requests NONE  Final   Culture NO GROWTH 1 DAY  Final   Report Status 01/07/2015 FINAL  Final     Studies: Dg Shoulder Left  01/06/2015  CLINICAL DATA:  Fall 2 weeks ago.  Pain with abduction. EXAM: LEFT SHOULDER - 2+ VIEW COMPARISON:  Left shoulder radiographs 05/08/2012 FINDINGS: The left shoulder is located. A lucency in the distal clavicle is compatible with a nondisplaced fracture. No other acute bone or soft tissue abnormality is present. A pacemaker is stable in position. A pacing/ defibrillator pad is noted as well. IMPRESSION: 1. Nondisplaced fracture in the distal clavicle. 2. The glenohumeral joint is located. 3. The pacemaker is stable. Electronically Signed   By: San Morelle M.D.   On: 01/06/2015 17:23    Scheduled Meds: . antiseptic oral rinse  7 mL Mouth Rinse BID  . calcium carbonate  1,250 mg Oral Q breakfast  . cefTRIAXone (ROCEPHIN)  IV  1 g Intravenous Q24H  . cyanocobalamin  1,500 mcg Oral Daily  . feeding supplement (ENSURE ENLIVE)  237 mL Oral BID BM  . isosorbide-hydrALAZINE  1 tablet Oral BID  . levETIRAcetam  500 mg Oral BID  . levothyroxine  88 mcg Oral QAC breakfast  . loratadine  10 mg Oral Daily  . multivitamin with minerals  1 tablet Oral Daily  . pantoprazole  40 mg Oral Daily  . polyethylene glycol  17 g Oral Daily  . senna-docusate  1 tablet Oral BID   Continuous Infusions: . sodium chloride 10 mL/hr at 01/07/15 0800    Principal Problem:   Sick sinus syndrome with tachycardia (HCC) Active Problems:   HYPERTENSION, BENIGN   Atrial fibrillation (HCC)   Sick sinus syndrome (HCC)   Ventricular tachycardia (HCC)    Time spent: 35  minutes.     Niel Hummer A  [facility] Pager [phone]. If 7PM-7AM, please contact night-coverage at [email or web address], password [facility] 01/07/2015, 1:57 PM  LOS: 2 days

## 2015-01-07 NOTE — Progress Notes (Signed)
CSW order received to assist patient with SNF placement.  MD: please order PT/OT Evaluations when medically appropriate as this will be required to obtain insurance authorization.  Many thanks!  Lorie Phenix. Pauline Good, [place]

## 2015-01-07 NOTE — Progress Notes (Signed)
01/07/2015 0630  Continue to rest quietly now. Monitored on Z[place]. RR regular and unlabored. Family in room. Marcy Bogosian, Carolynn Comment

## 2015-01-07 NOTE — Progress Notes (Signed)
01/07/2015 0310  Pt who has been pleasantly confused all shift, now with extreme confusion. Combative. Seeing things in room. Will follow directions. Son called and allowed pt to talk with but no change in confusion. Hitting and kicking. Dr. Elsworth Soho with E-link camera in and ordered Haldol IV. Given 4 mg IV. Coriann Brouhard, Carolynn Comment

## 2015-01-07 NOTE — Progress Notes (Signed)
01/07/2015 3:45 AM Less combative. Still extremely confused. Willnot allow BP or O2 sat.  Nurse by beside. Jamaya Sleeth, Carolynn Comment

## 2015-01-08 ENCOUNTER — Inpatient Hospital Stay (HOSPITAL_COMMUNITY): Payer: Commercial Managed Care - HMO

## 2015-01-08 DIAGNOSIS — E039 Hypothyroidism, unspecified: Secondary | ICD-10-CM | POA: Diagnosis not present

## 2015-01-08 DIAGNOSIS — I4891 Unspecified atrial fibrillation: Secondary | ICD-10-CM

## 2015-01-08 DIAGNOSIS — G9340 Encephalopathy, unspecified: Secondary | ICD-10-CM | POA: Diagnosis not present

## 2015-01-08 DIAGNOSIS — I495 Sick sinus syndrome: Secondary | ICD-10-CM | POA: Diagnosis not present

## 2015-01-08 DIAGNOSIS — S42002D Fracture of unspecified part of left clavicle, subsequent encounter for fracture with routine healing: Secondary | ICD-10-CM | POA: Diagnosis not present

## 2015-01-08 DIAGNOSIS — R55 Syncope and collapse: Secondary | ICD-10-CM | POA: Diagnosis not present

## 2015-01-08 DIAGNOSIS — Z950 Presence of cardiac pacemaker: Secondary | ICD-10-CM | POA: Diagnosis not present

## 2015-01-08 DIAGNOSIS — R262 Difficulty in walking, not elsewhere classified: Secondary | ICD-10-CM | POA: Diagnosis not present

## 2015-01-08 DIAGNOSIS — F0390 Unspecified dementia without behavioral disturbance: Secondary | ICD-10-CM | POA: Diagnosis not present

## 2015-01-08 DIAGNOSIS — I442 Atrioventricular block, complete: Secondary | ICD-10-CM | POA: Diagnosis not present

## 2015-01-08 DIAGNOSIS — I10 Essential (primary) hypertension: Secondary | ICD-10-CM | POA: Diagnosis not present

## 2015-01-08 DIAGNOSIS — S42032A Displaced fracture of lateral end of left clavicle, initial encounter for closed fracture: Secondary | ICD-10-CM | POA: Diagnosis not present

## 2015-01-08 DIAGNOSIS — I5032 Chronic diastolic (congestive) heart failure: Secondary | ICD-10-CM | POA: Diagnosis not present

## 2015-01-08 DIAGNOSIS — T82191A Other mechanical complication of cardiac pulse generator (battery), initial encounter: Secondary | ICD-10-CM | POA: Diagnosis not present

## 2015-01-08 DIAGNOSIS — I481 Persistent atrial fibrillation: Secondary | ICD-10-CM | POA: Diagnosis not present

## 2015-01-08 LAB — CBC
HCT: 34.7 % — ABNORMAL LOW (ref 36.0–46.0)
HEMOGLOBIN: 11.2 g/dL — AB (ref 12.0–15.0)
MCH: 29.2 pg (ref 26.0–34.0)
MCHC: 32.3 g/dL (ref 30.0–36.0)
MCV: 90.6 fL (ref 78.0–100.0)
PLATELETS: 130 10*3/uL — AB (ref 150–400)
RBC: 3.83 MIL/uL — AB (ref 3.87–5.11)
RDW: 14.6 % (ref 11.5–15.5)
WBC: 7.4 10*3/uL (ref 4.0–10.5)

## 2015-01-08 LAB — BASIC METABOLIC PANEL
ANION GAP: 8 (ref 5–15)
BUN: 14 mg/dL (ref 6–20)
CHLORIDE: 112 mmol/L — AB (ref 101–111)
CO2: 23 mmol/L (ref 22–32)
Calcium: 9.3 mg/dL (ref 8.9–10.3)
Creatinine, Ser: 1.22 mg/dL — ABNORMAL HIGH (ref 0.44–1.00)
GFR calc non Af Amer: 38 mL/min — ABNORMAL LOW (ref >60)
GFR, EST AFRICAN AMERICAN: 44 mL/min — AB (ref >60)
Glucose, Bld: 113 mg/dL — ABNORMAL HIGH (ref 65–99)
Potassium: 3.2 mmol/L — ABNORMAL LOW (ref 3.5–5.1)
SODIUM: 143 mmol/L (ref 135–145)

## 2015-01-08 LAB — T3, FREE: T3, Free: 1 pg/mL — ABNORMAL LOW (ref 2.0–4.4)

## 2015-01-08 MED ORDER — LEVETIRACETAM 500 MG PO TABS
500.0000 mg | ORAL_TABLET | Freq: Two times a day (BID) | ORAL | Status: DC
  Administered 2015-01-08: 500 mg via ORAL
  Filled 2015-01-08: qty 1

## 2015-01-08 MED ORDER — LEVOTHYROXINE SODIUM 150 MCG PO TABS: 150.0000 ug | ORAL_TABLET | Freq: Every day | ORAL | Status: DC

## 2015-01-08 MED ORDER — POTASSIUM CHLORIDE CRYS ER 20 MEQ PO TBCR
40.0000 meq | EXTENDED_RELEASE_TABLET | Freq: Two times a day (BID) | ORAL | Status: DC
  Administered 2015-01-08: 40 meq via ORAL
  Filled 2015-01-08: qty 2

## 2015-01-08 NOTE — Progress Notes (Signed)
  Echocardiogram 2D Echocardiogram has been performed.  Darlina Sicilian M 01/08/2015, 3:59 PM

## 2015-01-08 NOTE — Discharge Summary (Signed)
ELECTROPHYSIOLOGY PROCEDURE DISCHARGE SUMMARY    Patient ID: AMRY FINGERHUT,  MRN: [id number], DOB/AGE: 1921-10-05 81 y.o.  Admit date: 01/05/2015 Discharge date: 01/08/2015  Primary Care Physician: Wende Neighbors, MD Primary Cardiologist: previously Olevia Perches Electrophysiologist: Lovena Le  Primary Discharge Diagnosis:  Complete heart block with pacemaker at EOS and pause dependent Torsades s/p PPM generator change this admission  Secondary Discharge Diagnosis:  1.  Syncope  2.  Clavicular fracture - to follow up with orthopedics as an outpatient 3.  Hypertension 4.  Chronic diastolic dysfunction 5.  Hyperlipidemia 6.  Hypertension 7.  Dementia 8.  Persistent atrial fibrillation 9.  Hypothyroidism  No Known Allergies  Consults: - Internal medicine  Procedures This Admission:  1.  Pacemaker generator change on 01-06-15 by Dr Lovena Le. The patient received a BSX dual chamber pacemaker. There were no early apparent complications.  2.  Shoulder x-ray on 01-06-15 demonstrated non-displaced left distal clavicular fracture  Brief HPI/Hospital Course:  [name] is a 81 y.o. female with a past medical history as outlined above. She has not had her pacemaker checked in over 2 years despite certified letter from our office. She was found at home unresponsive on the day of admission and was [facility] to [facility] where she was bradycardic and had pause dependent torsades. She was started on Isuprel and taken to [facility] for further evaluation. Device interrogation demonstrated that the device was EOS. She underwent emergent pacemaker generator change on 01-06-15.  Left shoulder x-ray demonstrated non-displaced clavicular fracture.  She will be referred to orthopedics as an outpatient per Dr Lovena Le.   She had some acute delirium post op that improved with Haldol. She was evaluated by PT who recommended SNF placement.  She was evaluated by Dr Lovena Le on 01-08-15 and considered stable for  discharge to home. She will need repeat BMET in 1 week as well as wound check.   She has known persistent atrial fibrillation but is not a candidate for anticoagulation at this time per Dr Lovena Le.   Physical Exam: Filed Vitals:   01/08/15 0328 01/08/15 0535 01/08/15 1007 01/08/15 1429  BP: 161/64 148/65 151/69 139/63  Pulse: 85 84 80 80  Temp: 98 F (36.7 C)  98 F (36.7 C)   TempSrc: Oral  Oral   Resp: 18  18 20   Height:      Weight: 163 lb (73.936 kg)     SpO2: 97%  97% 98%     Labs:   Lab Results  Component Value Date   WBC 7.4 01/08/2015   HGB 11.2* 01/08/2015   HCT 34.7* 01/08/2015   MCV 90.6 01/08/2015   PLT 130* 01/08/2015    Recent Labs Lab 01/06/15 0038  01/08/15 0313  NA 142  < > 143  K 3.9  < > 3.2*  CL 113*  < > 112*  CO2 16*  < > 23  BUN 14  < > 14  CREATININE 1.40*  < > 1.22*  CALCIUM 8.5*  < > 9.3  PROT 5.7*  --   --   BILITOT 0.5  --   --   ALKPHOS 113  --   --   ALT 35  --   --   AST 52*  --   --   GLUCOSE 247*  < > 113*  < > = values in this interval not displayed.   Discharge Medications:    Medication List    STOP taking these medications  apixaban 2.5 MG Tabs tablet  Commonly known as:  ELIQUIS     DSS 100 MG Caps     escitalopram 10 MG tablet  Commonly known as:  LEXAPRO     isosorbide-hydrALAZINE 20-37.5 MG tablet  Commonly known as:  BIDIL     oxyCODONE 5 MG immediate release tablet  Commonly known as:  ROXICODONE     polyethylene glycol packet  Commonly known as:  MIRALAX / GLYCOLAX     senna-docusate 8.6-50 MG tablet  Commonly known as:  Senokot-S      TAKE these medications        calcium carbonate 600 MG Tabs tablet  Commonly known as:  OS-CAL  Take 600 mg by mouth daily with breakfast.     diltiazem 180 MG 24 hr capsule  Commonly known as:  TIAZAC  Take 180 mg by mouth daily.     hydrALAZINE 25 MG tablet  Commonly known as:  APRESOLINE  Take 25 mg by mouth 2 (two) times daily.      lansoprazole 30 MG capsule  Commonly known as:  PREVACID  Take 30 mg by mouth daily at 12 noon.     levETIRAcetam 500 MG tablet  Commonly known as:  KEPPRA  Take 500 mg by mouth 2 (two) times daily.     levothyroxine 150 MCG tablet  Commonly known as:  SYNTHROID, LEVOTHROID  Take 1 tablet (150 mcg total) by mouth daily before breakfast.  Start taking on:  01/09/2015     loratadine 10 MG tablet  Commonly known as:  CLARITIN  Take 10 mg by mouth daily.     mirtazapine 15 MG tablet  Commonly known as:  REMERON  Take 15 mg by mouth at bedtime.     multivitamin with minerals Tabs tablet  Take 1 tablet by mouth daily.     Vitamin B-12 CR 1500 MCG Tbcr  Take 1,500 mcg by mouth daily.        Disposition:   Follow-up Information    Follow up with [facility] On 01/23/2015.   Specialty:  Cardiology   Why:  at 12:30PM for wound check   Contact information:   [address], [place] [phone]      Follow up with Cristopher Peru, MD On 04/10/2015.   Specialty:  Cardiology   Why:  at 9:45AM   Contact information:   [address] [address] [phone]       Follow up with [facility].   Why:  call their office to arrange evaluation for clavicular fracture      Duration of Discharge Encounter: Greater than 30 minutes including physician time.  Signed, Chanetta Marshall, NP 01/08/2015 3:25 PM   EP Attending  Patient seen and examined. Agree with above. [place] for discharge.  Mikle Bosworth.D.

## 2015-01-08 NOTE — Discharge Instructions (Signed)
Keep incision clean and dry for 10 days. °No driving for 2 days.  °You can remove outer dressing tomorrow. °Leave steri-strips (little pieces of tape) on until seen in the office for wound check appointment. °Call the office ([phone]) for redness, drainage, swelling, or fever. ° °

## 2015-01-08 NOTE — Clinical Social Work Note (Signed)
Clinical Social Work Assessment  Patient Details  Name: [name] MRN: [id number] Date of Birth: 1921/12/17  Date of referral:  01/08/15               Reason for consult:  Facility Placement                Permission sought to share information with:  Facility Sport and exercise psychologist, Family Supports Permission granted to share information::  Yes, Verbal Permission Granted  Name::      [name] (son))  Agency::     Relationship::     Contact Information:     Housing/Transportation Living arrangements for the past 2 months:  Single Family Home Source of Information:  Patient Patient Interpreter Needed:  None Criminal Activity/Legal Involvement Pertinent to Current Situation/Hospitalization:  No - Comment as needed Significant Relationships:  Adult Children Lives with:  Self Do you feel safe going back to the place where you live?  Yes Need for family participation in patient care:  Yes (Comment)  Care giving concerns:  Patient lives alone and PT recommending SNF.   Social Worker assessment / plan: BSW intern met with patient at bedside to complete assessment. BSW explained PT recommendation and referral process. Patient stated that she would like to talk to her sons before making a decision but if she has to she would be agreeable to going to a skilled nursing facility.  Patient stated that she does has a sister at [facility] and would like to be place there if possible. BSW intern contacted patient son [name]) to see what his thoughts were regarding his mom. He also stated that going to a skilled nursing facility would be her best bet due to the fact he has to have surgery and would not be able to provide that 24 hour assistance at home neither will his brother. He also stated that we look at facilities in [place] or close by [facility] so they could go and check on mom. Patient also has [employer] Medicare (sliverback).   Employment status:  Retired Radiation protection practitioner:  Medicare PT Recommendations:  [place] / Referral to community resources:  [place]  Patient/Family's Response to care: Patient and patient son are agreeable to SNF and son agree that it would be in the best interest of his mom.   Patient/Family's Understanding of and Emotional Response to Diagnosis, Current Treatment, and Prognosis: Patient and patient son has good insight on patients condition.  Emotional Assessment Appearance:  Appears stated age Attitude/Demeanor/Rapport:   (nice, sweet) Affect (typically observed):  Accepting, Pleasant Orientation:  Oriented to Self, Oriented to Place, Oriented to  Time, Oriented to Situation Alcohol / Substance use:  Never Used Psych involvement (Current and /or in the community):  No (Comment)  Discharge Needs  Concerns to be addressed:  No discharge needs identified Readmission within the last 30 days:  No Current discharge risk:  None Barriers to Discharge:  No Barriers Identified   [place] intern  [phone]

## 2015-01-08 NOTE — Progress Notes (Signed)
Orthopedic Tech Progress Note Patient Details:  [name] 1921-04-17 [id number]  Ortho Devices Type of Ortho Device: Arm sling Ortho Device/Splint Location: LUE Ortho Device/Splint Interventions: Ordered, Application   Braulio Bosch 01/08/2015, 5:23 PM

## 2015-01-08 NOTE — Progress Notes (Signed)
This RN called [facility] and gave a report about the pt to [place]. Awaiting EMS to pick pt up. Oren Beckmann, RN

## 2015-01-08 NOTE — Evaluation (Signed)
Occupational Therapy Evaluation Patient Details Name: [name] MRN: [id number] DOB: 04/16/1921 Today's Date: 01/08/2015    History of Present Illness The patient is a 81 yo woman with symptomatic bradycardia and atrial fib who was lost to followup and presented with syncope and found to have long pauses and tdP secondary to her pauses. Her PPM is nonfunctioning. It was known to have 2 years of battery longevity 2.5 years ago at her last followup. She lives in a group home and her son who had been her caretaker has had throat CA and multiple complications and has not been able to help in her care. She presented on Isuprel IV after having pause induced VF.    Clinical Impression   Pt was living alone with intermittent assist of her sons who reside on the same property.  She used a walker for ambulation and could care for herself and prepare a simple meal.  Pt presents with generalized weakness and limited L UE use due to pacemaker and clavicle fx. Pt will need post acute rehab in SNF prior to return home.  Will follow acutely.    Follow Up Recommendations  SNF;Supervision/Assistance - 24 hour    Equipment Recommendations       Recommendations for Other Services       Precautions / Restrictions Precautions Precautions: Fall Precaution Comments: left clavicular fx from fall Restrictions Weight Bearing Restrictions: Yes Other Position/Activity Restrictions: L UE--clavicle fx, pacemaker      Mobility Bed Mobility               General bed mobility comments: pt in chair  Transfers Overall transfer level: Needs assistance Equipment used: Rolling walker (2 wheeled) Transfers: Sit to/from [facility] Sit to Stand: Mod assist Stand pivot transfers: Mod assist       General transfer comment: cues for hand placement and assist to rise and control descent    Balance Overall balance assessment: Needs assistance Sitting-balance support: Feet  supported Sitting balance-Leahy Scale: Fair       Standing balance-Leahy Scale: Poor Standing balance comment: able to release walker in standing                            ADL Overall ADL's : Needs assistance/impaired Eating/Feeding: Set up;Sitting   Grooming: Wash/dry hands;Oral care;Brushing hair;Sitting;Minimal assistance   Upper Body Bathing: Sitting;Moderate assistance   Lower Body Bathing: Maximal assistance;Sit to/from stand   Upper Body Dressing : Minimal assistance;Sitting Upper Body Dressing Details (indicate cue type and reason): instructed to dress L UE first Lower Body Dressing: Maximal assistance;Sit to/from stand Lower Body Dressing Details (indicate cue type and reason): able to doff socks and pull them up once started over her toes Toilet Transfer: Moderate assistance;RW;Stand-pivot   Toileting- Clothing Manipulation and Hygiene: Moderate assistance;Sit to/from stand               Vision     Perception     Praxis      Pertinent Vitals/Pain Pain Assessment: Faces Faces Pain Scale: Hurts little more Pain Location: L shoulder Pain Descriptors / Indicators: Aching Pain Intervention(s): Limited activity within patient's tolerance;Monitored during session;Repositioned     Hand Dominance Right   Extremity/Trunk Assessment Upper Extremity Assessment Upper Extremity Assessment: RUE deficits/detail;LUE deficits/detail RUE Deficits / Details: generalized weakness, arthritic changes in hand LUE Deficits / Details: did not assess shoulder, elbow to hand generally weak with arthritic changes in hand LUE: Unable  to fully assess due to immobilization;Unable to fully assess due to pain LUE Coordination: decreased gross motor   Lower Extremity Assessment Lower Extremity Assessment: Defer to PT evaluation   Cervical / Trunk Assessment Cervical / Trunk Assessment: Kyphotic   Communication Communication Communication: No difficulties    Cognition Arousal/Alertness: Awake/alert Behavior During Therapy:  (tearful at times) Overall Cognitive Status: Within Functional Limits for tasks assessed                 General Comments: Pt tearful when recalling having been "ugly" to staff yesterday when she was experiencing confusion and agitation.   General Comments       Exercises       Shoulder Instructions      Home Living Family/patient expects to be discharged to:: Skilled nursing facility                                 Additional Comments: pt was living alone with son's homes on either side, she could perform self care and ambulate, assisted for IADL, pt does not drive      Prior Functioning/Environment Level of Independence: Needs assistance  Gait / Transfers Assistance Needed: uses RW for ambulation, has had multiple falls, currently has left clavicular fx ADL's / Homemaking Assistance Needed: mod I for self care and light meal prep, assisted for IADL and transportation        OT Diagnosis: Generalized weakness;Acute pain   OT Problem List: Decreased strength;Decreased activity tolerance;Impaired balance (sitting and/or standing);Decreased coordination;Decreased knowledge of use of DME or AE;Impaired UE functional use;Pain   OT Treatment/Interventions: Self-care/ADL training;DME and/or AE instruction;Patient/family education;Balance training;Therapeutic activities    OT Goals(Current goals can be found in the care plan section) Acute Rehab OT Goals Patient Stated Goal: did not state OT Goal Formulation: With patient Time For Goal Achievement: 01/22/15 Potential to Achieve Goals: Good ADL Goals Pt Will Perform Grooming: with min guard assist;standing Pt Will Perform Upper Body Bathing: with supervision;sitting Pt Will Perform Lower Body Bathing: with min assist;sit to/from stand Pt Will Perform Upper Body Dressing: with supervision;sitting Pt Will Perform Lower Body Dressing: with min  assist;sit to/from stand Pt Will Transfer to Toilet: with min guard assist;ambulating;bedside commode Pt Will Perform Toileting - Clothing Manipulation and hygiene: with min guard assist;sit to/from stand  OT Frequency: Min 2X/week   Barriers to D/C: Decreased caregiver support          Co-evaluation              End of Session Equipment Utilized During Treatment: Rolling walker;Gait belt  Activity Tolerance: Patient tolerated treatment well Patient left: in chair;with call bell/phone within reach;with family/visitor present   Time: 1320-1350 OT Time Calculation (min): 30 min Charges:  OT General Charges $OT Visit: 1 Procedure OT Evaluation $Initial OT Evaluation Tier I: 1 Procedure OT Treatments $Self Care/Home Management : 8-22 mins G-Codes:    Malka So 01/08/2015, 2:02 PM  [phone]

## 2015-01-08 NOTE — Clinical Social Work Placement (Signed)
   CLINICAL SOCIAL WORK PLACEMENT  NOTE  Date:  01/08/2015  Patient Details  Name: [name] MRN: [id number] Date of Birth: 1921-06-23  Clinical Social Work is seeking post-discharge placement for this patient at the [place] level of care (*CSW will initial, date and re-position this form in  chart as items are completed):  Yes   Patient/family provided with [place] Work Department's list of facilities offering this level of care within the geographic area requested by the patient (or if unable, by the patient's family).  Yes   Patient/family informed of their freedom to choose among providers that offer the needed level of care, that participate in Medicare, Medicaid or managed care program needed by the patient, have an available bed and are willing to accept the patient.  Yes   Patient/family informed of [facility]'s ownership interest in [facility] and [facility], as well as of the fact that they are under no obligation to receive care at these facilities.  PASRR submitted to EDS on 01/08/15     PASRR number received on 01/08/15     Existing PASRR number confirmed on       FL2 transmitted to all facilities in geographic area requested by pt/family on 01/08/15     FL2 transmitted to all facilities within larger geographic area on       Patient informed that his/her managed care company has contracts with or will negotiate with certain facilities, including the following:            Patient/family informed of bed offers received.  Patient chooses bed at       Physician recommends and patient chooses bed at      Patient to be transferred to   on  .  Patient to be transferred to facility by       Patient family notified on   of transfer.  Name of family member notified:        PHYSICIAN Please sign FL2     Additional Comment:    _______________________________________________ [place] intern   [phone]

## 2015-01-08 NOTE — Progress Notes (Signed)
Patient ID: [name], female   DOB: Apr 02, 1921, 82 y.o.   MRN: [id number]    SUBJECTIVE: The patient is doing well today.  At this time, she denies chest pain, shortness of breath, or any new concerns. She has awakened after Haldol washout  . antiseptic oral rinse  7 mL Mouth Rinse BID  . calcium carbonate  1,250 mg Oral Q breakfast  . cefTRIAXone (ROCEPHIN)  IV  1 g Intravenous Q24H  . feeding supplement (ENSURE ENLIVE)  237 mL Oral BID BM  . isosorbide-hydrALAZINE  1 tablet Oral BID  . levETIRAcetam  500 mg Intravenous Q12H  . levothyroxine  50 mcg Intravenous QAC breakfast  . loratadine  10 mg Oral Daily  . mirtazapine  15 mg Oral QHS  . multivitamin with minerals  1 tablet Oral Daily  . pantoprazole  40 mg Oral Daily  . polyethylene glycol  17 g Oral Daily  . senna-docusate  1 tablet Oral BID  . vitamin B-12  1,500 mcg Oral Daily   . sodium chloride 10 mL/hr at 01/07/15 0800  . dextrose 50 mL/hr at 01/07/15 1843    OBJECTIVE: Physical Exam: Filed Vitals:   01/07/15 0600 01/07/15 2032 01/08/15 0328 01/08/15 0535  BP:  148/56 161/64 148/65  Pulse:  85 85 84  Temp:  97.4 F (36.3 C) 98 F (36.7 C)   TempSrc:  Oral Oral   Resp: 22 18 18    Height:      Weight:   163 lb (73.936 kg)   SpO2:  96% 97%     Intake/Output Summary (Last 24 hours) at 01/08/15 0743 Last data filed at 01/08/15 0448  Gross per 24 hour  Intake    280 ml  Output    810 ml  Net   -530 ml    Telemetry - AV sequential pacing  GEN- The patient is well appearing, elderly woman, alert and oriented x 3 today.   Head- normocephalic, atraumatic Eyes-  Sclera clear, conjunctiva pink Ears- hearing intact Oropharynx- clear Neck- supple, no JVP Lymph- no cervical lymphadenopathy Lungs- Clear to ausculation bilaterally, normal work of breathing, no hematoma Heart- Regular rate and rhythm, no murmurs, rubs or gallops, PMI not laterally displaced GI- soft, NT, ND, + BS Extremities- no clubbing,  cyanosis, or edema Skin- no rash or lesion Psych- euthymic mood, full affect Neuro- strength and sensation are intact  LABS: Basic Metabolic Panel:  Recent Labs  01/05/15 2338 01/06/15 0038  01/07/15 1538 01/08/15 0313  NA  --  142  < > 140 143  K  --  3.9  < > 3.2* 3.2*  CL  --  113*  < > 110 112*  CO2  --  16*  < > 21* 23  GLUCOSE  --  247*  < > 98 113*  BUN  --  14  < > 10 14  CREATININE  --  1.40*  < > 1.20* 1.22*  CALCIUM  --  8.5*  < > 8.9 9.3  MG 1.6* 1.6*  --  1.9  --   PHOS 2.9  --   --   --   --   < > = values in this interval not displayed. Liver Function Tests:  Recent Labs  01/06/15 0038  AST 52*  ALT 35  ALKPHOS 113  BILITOT 0.5  PROT 5.7*  ALBUMIN 3.3*   No results for input(s): LIPASE, AMYLASE in the last 72 hours. CBC:  Recent Labs  01/06/15  [id number] 01/08/15 0313  WBC 6.4 7.4  HGB 11.9* 11.2*  HCT 36.1 34.7*  MCV 90.9 90.6  PLT 113* 130*   Cardiac Enzymes:  Recent Labs  01/06/15 0038 01/06/15 0702 01/06/15 1300  TROPONINI 0.12* 0.14* 0.12*   BNP: Invalid input(s): POCBNP D-Dimer: No results for input(s): DDIMER in the last 72 hours. Hemoglobin A1C:  Recent Labs  01/06/15 0702  HGBA1C 6.0*   Fasting Lipid Panel:  Recent Labs  01/06/15 0702  CHOL 132  HDL 48  LDLCALC 68  TRIG 81  CHOLHDL 2.8   Thyroid Function Tests:  Recent Labs  01/06/15 0143 01/07/15 1538  TSH 18.166*  --   T3FREE  --  1.0*   Anemia Panel: No results for input(s): VITAMINB12, FOLATE, FERRITIN, TIBC, IRON, RETICCTPCT in the last 72 hours.  RADIOLOGY: Dg Shoulder Left  01/06/2015  CLINICAL DATA:  Fall 2 weeks ago.  Pain with abduction. EXAM: LEFT SHOULDER - 2+ VIEW COMPARISON:  Left shoulder radiographs 05/08/2012 FINDINGS: The left shoulder is located. A lucency in the distal clavicle is compatible with a nondisplaced fracture. No other acute bone or soft tissue abnormality is present. A pacemaker is stable in position. A pacing/  defibrillator pad is noted as well. IMPRESSION: 1. Nondisplaced fracture in the distal clavicle. 2. The glenohumeral joint is located. 3. The pacemaker is stable. Electronically Signed   By: San Morelle M.D.   On: 01/06/2015 17:23    ASSESSMENT AND PLAN:  Principal Problem:   Sick sinus syndrome with tachycardia (HCC) Active Problems:   HYPERTENSION, BENIGN   Atrial fibrillation (HCC)   Sick sinus syndrome (HCC)   Ventricular tachycardia (HCC)   Acute encephalopathy hypokalemia Broken clavicle Rec: she is much improved today and her encephalopathy appears to be resolved. Will seek placement in SNF. Await ortho recs. I suspect she will need a sling for left arm. Despite her atrial arrhythmias, she is not a candidate for coumadin or a NOAC.  Cristopher Peru, MD 01/08/2015 7:43 AM

## 2015-01-08 NOTE — NC FL2 (Signed)
[place] LEVEL OF CARE SCREENING TOOL     IDENTIFICATION  Patient Name: [name] Birthdate: Apr 13, 1921 Sex: female Admission Date (Current Location): 01/05/2015  [facility] and [place] Number:  Oval Linsey)   Facility and Address:  [facility] [place], [place] [address], [place], [place] [address]      Provider Number:    Attending Physician Name and Address:  Arnoldo Lenis, MD  Relative Name and Phone Number:   Taziah Klosterman [facility]) [phone])    Current Level of Care: Hospital Recommended Level of Care: [place] Prior Approval Number:    Date Approved/Denied:   PASRR Number:  [id number]  Discharge Plan: SNF    Current Diagnoses: Patient Active Problem List   Diagnosis Date Noted  . Acute encephalopathy 01/07/2015  . Ventricular tachycardia ([place]) 01/06/2015  . Sick sinus syndrome with tachycardia ([place]) 01/05/2015  . Chronic anticoagulation - now on Eliquis   . Ischemia of hand 02/05/2014  . Ischemia of upper extremity 02/05/2014  . Peripheral vascular disease, unspecified ([place]) 02/13/2013  . 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 Pacemaker-Medtronic 01/08/2012  . Dysphagia 10/14/2010  . Acute on chronic diastolic heart failure ([place]) 03/07/2010  . CHEST PAIN UNSPECIFIED 03/07/2010  . BEN HTN HEART DISEASE WITHOUT HEART FAIL 09/10/2009  . ATAXIA AS LATE EFFECT OF CEREBROVASCULAR DISEASE 07/26/2009  . FATIGUE / MALAISE 07/26/2009  . ABNORMALITY OF GAIT 07/26/2009  . HYPERTENSION, BENIGN 10/16/2008  . CHEST PAIN-PAINFUL RESPIRATIONS 10/16/2008  . EDEMA 05/01/2008  . Atrial fibrillation ([place]) 01/29/2008  . Sick sinus syndrome ([place]) 01/29/2008    Orientation ACTIVITIES/SOCIAL BLADDER RESPIRATION    Self, Time, Situation, Place  Passive Continent Normal  BEHAVIORAL SYMPTOMS/MOOD NEUROLOGICAL BOWEL NUTRITION  STATUS      Continent Diet (heart room)  PHYSICIAN VISITS COMMUNICATION OF NEEDS Height & Weight Skin  30 days Verbally 5\' 4"  (162.6 cm) 163 lbs. Normal          AMBULATORY STATUS RESPIRATION    Supervision limited Normal      Personal Care Assistance Level of Assistance  Bathing, Feeding, Dressing Bathing Assistance: Limited assistance Feeding assistance: Independent Dressing Assistance: Limited assistance      Functional Limitations Info  Sight, Hearing, Speech Sight Info: Adequate Hearing Info: Adequate Speech Info: Adequate       SPECIAL CARE FACTORS FREQUENCY  PT (By licensed PT)     PT Frequency: 5x             Additional Factors Info  Code Status, Allergies Code Status Info: full Allergies Info: NKA           Current Medications (01/08/2015):  This is the current hospital active medication list Current Facility-Administered Medications  Medication Dose Route Frequency Provider Last Rate Last Dose  . 0.9 %  sodium chloride infusion   Intravenous Continuous Jules Husbands, MD 10 mL/hr at 01/07/15 0800    . acetaminophen (TYLENOL) tablet 325-650 mg  325-650 mg Oral Q4H PRN Evans Lance, MD   650 mg at 01/06/15 1530  . antiseptic oral rinse (CPC / CETYLPYRIDINIUM CHLORIDE 0.05%) solution 7 mL  7 mL Mouth Rinse BID Jules Husbands, MD   7 mL at 01/06/15 2200  . calcium carbonate (OS-CAL - dosed in mg of elemental calcium) tablet 1,250 mg  1,250 mg Oral Q breakfast Jules Husbands, MD   1,250 mg at 01/08/15 0820  .  cefTRIAXone (ROCEPHIN) 1 g in dextrose 5 % 50 mL IVPB  1 g Intravenous Q24H Erenest Blank, RPH   1 g at 01/07/15 2203  . dextrose 5 % solution   Intravenous Continuous Belkys A Regalado, MD 50 mL/hr at 01/07/15 1843    . feeding supplement (ENSURE ENLIVE) (ENSURE ENLIVE) liquid 237 mL  237 mL Oral BID BM Jenifer A Williams, RD   237 mL at 01/07/15 1545  . isosorbide-hydrALAZINE (BIDIL) 20-37.5 MG per tablet 1 tablet  1 tablet Oral BID Jules Husbands, MD   1  tablet at 01/07/15 2007  . levETIRAcetam (KEPPRA) 500 mg in sodium chloride 0.9 % 100 mL IVPB  500 mg Intravenous Q12H Belkys A Regalado, MD   500 mg at 01/07/15 2006  . levothyroxine (SYNTHROID, LEVOTHROID) injection 50 mcg  50 mcg Intravenous QAC breakfast Belkys A Regalado, MD   50 mcg at 01/08/15 0644  . loratadine (CLARITIN) tablet 10 mg  10 mg Oral Daily Jules Husbands, MD   10 mg at 01/06/15 1000  . mirtazapine (REMERON) tablet 15 mg  15 mg Oral QHS Arnoldo Lenis, MD   15 mg at 01/07/15 2006  . multivitamin with minerals tablet 1 tablet  1 tablet Oral Daily Jules Husbands, MD   1 tablet at 01/06/15 1000  . nitroGLYCERIN (NITROSTAT) SL tablet 0.4 mg  0.4 mg Sublingual Q5 Min x 3 PRN Jules Husbands, MD      . ondansetron [facility]) injection 4 mg  4 mg Intravenous Q6H PRN Evans Lance, MD      . pantoprazole (PROTONIX) EC tablet 40 mg  40 mg Oral Daily Jules Husbands, MD   40 mg at 01/06/15 1000  . polyethylene glycol (MIRALAX / GLYCOLAX) packet 17 g  17 g Oral Daily Jules Husbands, MD   17 g at 01/06/15 1000  . potassium chloride SA (K-DUR,KLOR-CON) CR tablet 40 mEq  40 mEq Oral BID Patsey Berthold, NP      . senna-docusate (Senokot-S) tablet 1 tablet  1 tablet Oral BID Jules Husbands, MD   1 tablet at 01/06/15 2121  . vitamin B-12 (CYANOCOBALAMIN) tablet 1,500 mcg  1,500 mcg Oral Daily Jules Husbands, MD   1,500 mcg at 01/06/15 1000     Discharge Medications: Please see discharge summary for a list of discharge medications.  Relevant Imaging Results:  Relevant Lab Results:  Recent Labs    Additional Information SS# [id number]  Minta Balsam  BSW intern  [phone]

## 2015-01-08 NOTE — Progress Notes (Signed)
Pt in stable condition. IV taken out, cardiac monitor DC, CCMD notified , pt belongings given to pt. Pt taken off the floor on wheelcahir by the tech and her sons transported her to [place]. Oren Beckmann, RN

## 2015-01-08 NOTE — Progress Notes (Signed)
Patient Demographics  [name], is a 81 y.o. female, DOB - 11-02-1921, [id number]  Admit date - 01/05/2015   Admitting Physician Jules Husbands, MD  Outpatient Primary MD for the patient is Dominique Neighbors, MD  LOS - 3   No chief complaint on file.        Subjective:   [name] today has, No headache, No chest pain, No abdominal pain - No Nausea, No new weakness tingling or numbness, No Cough - SOB, further episodes of confusion or agitation overnight. Assessment & Plan    Principal Problem:   Sick sinus syndrome with tachycardia (HCC) Active Problems:   HYPERTENSION, BENIGN   Atrial fibrillation (HCC)   Sick sinus syndrome (HCC)   Ventricular tachycardia (HCC)   Acute encephalopathy  Acute encephalopathy secondary to delirium - This is related to acute delirium, patient back to baseline. - No further workup indicated.  UTI - Patient UA with pyuria, and culture with no growth, finished 3 days of IV Rocephin, can be discontinued.  Symptomatic bradycardia - Per primary cardiology team, status post new pacemaker  Hypothyroidism - Shaped on Synthroid 100 g oral daily, TSH significantly elevated, low free T4 and T3 free level, will increase her Synthroid to 150 g oral daily, will need repeat labs in 4 weeks as an outpatient.    We'll sign off, please reconsult [place] for any further questions.  Medications  Scheduled Meds: . antiseptic oral rinse  7 mL Mouth Rinse BID  . calcium carbonate  1,250 mg Oral Q breakfast  . feeding supplement (ENSURE ENLIVE)  237 mL Oral BID BM  . isosorbide-hydrALAZINE  1 tablet Oral BID  . levETIRAcetam  500 mg Oral BID  . levothyroxine  50 mcg Intravenous QAC breakfast  . loratadine  10 mg Oral Daily  . mirtazapine  15 mg Oral QHS  . multivitamin with minerals  1 tablet Oral Daily  . pantoprazole  40 mg Oral Daily  . polyethylene glycol  17 g  Oral Daily  . potassium chloride  40 mEq Oral BID  . senna-docusate  1 tablet Oral BID  . vitamin B-12  1,500 mcg Oral Daily   Continuous Infusions: . sodium chloride 10 mL/hr at 01/07/15 0800  . dextrose 50 mL/hr at 01/07/15 1843   PRN Meds:.acetaminophen, nitroGLYCERIN, ondansetron (ZOFRAN) IV  DVT Prophylaxis  SCDs  Lab Results  Component Value Date   PLT 130* 01/08/2015    Antibiotics   Anti-infectives    Start     Dose/Rate Route Frequency Ordered Stop   01/06/15 2000  cefTRIAXone (ROCEPHIN) 1 g in dextrose 5 % 50 mL IVPB  Status:  Discontinued     1 g 100 mL/hr over 30 Minutes Intravenous Every 24 hours 01/06/15 0028 01/08/15 0907   01/06/15 1600  ceFAZolin (ANCEF) IVPB 1 g/50 mL premix     1 g 100 mL/hr over 30 Minutes Intravenous Every 6 hours 01/06/15 1111 01/06/15 1557   01/06/15 1007  ceFAZolin (ANCEF) IVPB 2 g/50 mL premix  Status:  Discontinued     over 30 Minutes  As needed 01/06/15 1007 01/06/15 1040          Objective:   Filed Vitals:   01/07/15 2032 01/08/15 [id number]  01/08/15 0535 01/08/15 1007  BP: 148/56 161/64 148/65 151/69  Pulse: 85 85 84 80  Temp: 97.4 F (36.3 C) 98 F (36.7 C)  98 F (36.7 C)  TempSrc: Oral Oral  Oral  Resp: 18 18  18   Height:      Weight:  73.936 kg (163 lb)    SpO2: 96% 97%  97%    Wt Readings from Last 3 Encounters:  01/08/15 73.936 kg (163 lb)  03/01/14 51.256 kg (113 lb)  02/06/14 51.8 kg (114 lb 3.2 oz)     Intake/Output Summary (Last 24 hours) at 01/08/15 1229 Last data filed at 01/08/15 0745  Gross per 24 hour  Intake    480 ml  Output    475 ml  Net      5 ml     Physical Exam   General: Alert, pleasant, communicative  Cardiovascular: S 1, S 2 RRR  Respiratory: No wheezing, mild crackles.   Abdomen: BS present, soft, nt  Musculoskeletal: no edema   Data Review   Micro Results Recent Results (from the past 240 hour(s))  MRSA PCR Screening     Status: None   Collection Time: 01/05/15  10:25 PM  Result Value Ref Range Status   MRSA by PCR NEGATIVE NEGATIVE Final    Comment:        The GeneXpert MRSA Assay (FDA approved for NASAL specimens only), is one component of a comprehensive MRSA colonization surveillance program. It is not intended to diagnose MRSA infection nor to guide or monitor treatment for MRSA infections.   Culture, Urine     Status: None   Collection Time: 01/06/15 12:21 AM  Result Value Ref Range Status   Specimen Description URINE, CATHETERIZED  Final   Special Requests NONE  Final   Culture NO GROWTH 1 DAY  Final   Report Status 01/07/2015 FINAL  Final    Radiology Reports Dg Shoulder Left  01/06/2015  CLINICAL DATA:  Fall 2 weeks ago.  Pain with abduction. EXAM: LEFT SHOULDER - 2+ VIEW COMPARISON:  Left shoulder radiographs 05/08/2012 FINDINGS: The left shoulder is located. A lucency in the distal clavicle is compatible with a nondisplaced fracture. No other acute bone or soft tissue abnormality is present. A pacemaker is stable in position. A pacing/ defibrillator pad is noted as well. IMPRESSION: 1. Nondisplaced fracture in the distal clavicle. 2. The glenohumeral joint is located. 3. The pacemaker is stable. Electronically Signed   By: San Morelle M.D.   On: 01/06/2015 17:23     CBC  Recent Labs Lab 01/06/15 0702 01/08/15 0313  WBC 6.4 7.4  HGB 11.9* 11.2*  HCT 36.1 34.7*  PLT 113* 130*  MCV 90.9 90.6  MCH 30.0 29.2  MCHC 33.0 32.3  RDW 14.6 14.6    Chemistries   Recent Labs Lab 01/05/15 2338 01/06/15 0038 01/06/15 0702 01/07/15 1538 01/08/15 0313  NA  --  142 142 140 143  K  --  3.9 3.9 3.2* 3.2*  CL  --  113* 117* 110 112*  CO2  --  16* 16* 21* 23  GLUCOSE  --  247* 111* 98 113*  BUN  --  14 14 10 14   CREATININE  --  1.40* 1.13* 1.20* 1.22*  CALCIUM  --  8.5* 8.8* 8.9 9.3  MG 1.6* 1.6*  --  1.9  --   AST  --  52*  --   --   --   ALT  --  35  --   --   --   ALKPHOS  --  113  --   --   --   BILITOT  --   0.5  --   --   --    ------------------------------------------------------------------------------------------------------------------ estimated creatinine clearance is 29.6 mL/min (by C-G formula based on Cr of 1.22). ------------------------------------------------------------------------------------------------------------------  Recent Labs  01/06/15 0702  HGBA1C 6.0*   ------------------------------------------------------------------------------------------------------------------  Recent Labs  01/06/15 0702  CHOL 132  HDL 48  LDLCALC 68  TRIG 81  CHOLHDL 2.8   ------------------------------------------------------------------------------------------------------------------  Recent Labs  01/06/15 0143 01/07/15 1538  TSH 18.166*  --   T3FREE  --  1.0*   ------------------------------------------------------------------------------------------------------------------ No results for input(s): VITAMINB12, FOLATE, FERRITIN, TIBC, IRON, RETICCTPCT in the last 72 hours.  Coagulation profile  Recent Labs Lab 01/06/15 0038  INR 1.36    No results for input(s): DDIMER in the last 72 hours.  Cardiac Enzymes  Recent Labs Lab 01/06/15 0038 01/06/15 0702 01/06/15 1300  TROPONINI 0.12* 0.14* 0.12*   ------------------------------------------------------------------------------------------------------------------ Invalid input(s): POCBNP     Time Spent in minutes   20 minutes   Gwendelyn Lanting M.D on 01/08/2015 at 12:29 PM  Between 7am to 7pm - Pager - [phone]  After 7pm go to [email or web address] - password [email or web address]  [facility]   Office  [phone]

## 2015-01-10 ENCOUNTER — Other Ambulatory Visit: Payer: Self-pay | Admitting: *Deleted

## 2015-01-10 ENCOUNTER — Telehealth: Payer: Self-pay | Admitting: Internal Medicine

## 2015-01-10 MED ORDER — LEVOTHYROXINE SODIUM 150 MCG PO TABS: 150.0000 ug | ORAL_TABLET | Freq: Every day | ORAL | Status: DC

## 2015-01-10 NOTE — Telephone Encounter (Signed)
New message     Pt had a pacemaker put in last Sunday.  When should she restart her eliquis?

## 2015-01-10 NOTE — Telephone Encounter (Signed)
Conferred with Dr Lovena Le.  Direction was given to continue to hold the Eliquis until the wound check appt on 01-23-15.  Will be assessed at that time.  Called caller and relayed this info.

## 2015-01-14 DIAGNOSIS — S42032A Displaced fracture of lateral end of left clavicle, initial encounter for closed fracture: Secondary | ICD-10-CM | POA: Diagnosis not present

## 2015-01-17 DIAGNOSIS — F0390 Unspecified dementia without behavioral disturbance: Secondary | ICD-10-CM | POA: Diagnosis not present

## 2015-01-17 DIAGNOSIS — R262 Difficulty in walking, not elsewhere classified: Secondary | ICD-10-CM | POA: Diagnosis not present

## 2015-01-17 DIAGNOSIS — I5032 Chronic diastolic (congestive) heart failure: Secondary | ICD-10-CM | POA: Diagnosis not present

## 2015-01-17 DIAGNOSIS — I442 Atrioventricular block, complete: Secondary | ICD-10-CM | POA: Diagnosis not present

## 2015-01-23 ENCOUNTER — Encounter: Payer: Self-pay | Admitting: Internal Medicine

## 2015-01-23 ENCOUNTER — Ambulatory Visit (INDEPENDENT_AMBULATORY_CARE_PROVIDER_SITE_OTHER): Payer: Commercial Managed Care - HMO | Admitting: *Deleted

## 2015-01-23 DIAGNOSIS — I481 Persistent atrial fibrillation: Secondary | ICD-10-CM | POA: Diagnosis not present

## 2015-01-23 DIAGNOSIS — I4819 Other persistent atrial fibrillation: Secondary | ICD-10-CM

## 2015-01-23 DIAGNOSIS — I495 Sick sinus syndrome: Secondary | ICD-10-CM | POA: Diagnosis not present

## 2015-01-23 LAB — CUP PACEART INCLINIC DEVICE CHECK
Brady Statistic RA Percent Paced: 89 %
Brady Statistic RV Percent Paced: 100 %
Date Time Interrogation Session: 20161214050000
Implantable Lead Implant Date: 20080502
Implantable Lead Location: 753860
Lead Channel Setting Pacing Amplitude: 1.3 V
Lead Channel Setting Pacing Amplitude: 2.5 V
Lead Channel Setting Pacing Pulse Width: 0.4 ms
Lead Channel Setting Sensing Sensitivity: 2.5 mV
MDC IDC LEAD IMPLANT DT: 20080502
MDC IDC LEAD LOCATION: 753859
MDC IDC MSMT LEADCHNL RA IMPEDANCE VALUE: 562 Ohm
MDC IDC MSMT LEADCHNL RA SENSING INTR AMPL: 1.3 mV
MDC IDC MSMT LEADCHNL RV IMPEDANCE VALUE: 457 Ohm
MDC IDC MSMT LEADCHNL RV PACING THRESHOLD AMPLITUDE: 0.8 V
MDC IDC MSMT LEADCHNL RV PACING THRESHOLD PULSEWIDTH: 0.4 ms
Pulse Gen Serial Number: 737493

## 2015-01-23 NOTE — Progress Notes (Signed)
Wound check appointment. Steri-strips removed. Wound without redness or edema. Incision edges approximated, wound well healed. Normal device function. Thresholds, sensing, and impedances consistent with implant measurements. Histogram distribution shows majority of HR 80s. 2 mode switches- both <1 min. No high ventricular rates noted. Lower rate changed from 80 to 70 per SK, SK wrote order for [facility] facility to restart eliquis and d/c ASA per GT recommendation. Pt educated about wound care, arm mobility, lifting restrictions. ROV with GT 04/10/15.

## 2015-02-13 DIAGNOSIS — F329 Major depressive disorder, single episode, unspecified: Secondary | ICD-10-CM | POA: Diagnosis not present

## 2015-02-13 DIAGNOSIS — E538 Deficiency of other specified B group vitamins: Secondary | ICD-10-CM | POA: Diagnosis not present

## 2015-02-13 DIAGNOSIS — I4891 Unspecified atrial fibrillation: Secondary | ICD-10-CM | POA: Diagnosis not present

## 2015-02-13 DIAGNOSIS — K55069 Acute infarction of intestine, part and extent unspecified: Secondary | ICD-10-CM | POA: Diagnosis not present

## 2015-02-13 DIAGNOSIS — K219 Gastro-esophageal reflux disease without esophagitis: Secondary | ICD-10-CM | POA: Diagnosis not present

## 2015-02-13 DIAGNOSIS — E785 Hyperlipidemia, unspecified: Secondary | ICD-10-CM | POA: Diagnosis not present

## 2015-02-13 DIAGNOSIS — Z418 Encounter for other procedures for purposes other than remedying health state: Secondary | ICD-10-CM | POA: Diagnosis not present

## 2015-02-13 DIAGNOSIS — E119 Type 2 diabetes mellitus without complications: Secondary | ICD-10-CM | POA: Diagnosis not present

## 2015-02-13 DIAGNOSIS — E038 Other specified hypothyroidism: Secondary | ICD-10-CM | POA: Diagnosis not present

## 2015-03-20 ENCOUNTER — Other Ambulatory Visit: Payer: Self-pay | Admitting: Nurse Practitioner

## 2015-03-20 NOTE — Telephone Encounter (Signed)
Needs to get from PCP

## 2015-03-28 DIAGNOSIS — E038 Other specified hypothyroidism: Secondary | ICD-10-CM | POA: Diagnosis not present

## 2015-03-28 DIAGNOSIS — I10 Essential (primary) hypertension: Secondary | ICD-10-CM | POA: Diagnosis not present

## 2015-03-28 DIAGNOSIS — J301 Allergic rhinitis due to pollen: Secondary | ICD-10-CM | POA: Diagnosis not present

## 2015-03-28 DIAGNOSIS — F329 Major depressive disorder, single episode, unspecified: Secondary | ICD-10-CM | POA: Diagnosis not present

## 2015-03-28 DIAGNOSIS — Z418 Encounter for other procedures for purposes other than remedying health state: Secondary | ICD-10-CM | POA: Diagnosis not present

## 2015-03-28 DIAGNOSIS — E538 Deficiency of other specified B group vitamins: Secondary | ICD-10-CM | POA: Diagnosis not present

## 2015-03-28 DIAGNOSIS — K219 Gastro-esophageal reflux disease without esophagitis: Secondary | ICD-10-CM | POA: Diagnosis not present

## 2015-03-28 DIAGNOSIS — E119 Type 2 diabetes mellitus without complications: Secondary | ICD-10-CM | POA: Diagnosis not present

## 2015-03-28 DIAGNOSIS — K55069 Acute infarction of intestine, part and extent unspecified: Secondary | ICD-10-CM | POA: Diagnosis not present

## 2015-03-29 ENCOUNTER — Other Ambulatory Visit: Payer: Self-pay

## 2015-03-29 NOTE — Patient Outreach (Signed)
[place] [facility]) Care Management  03/29/2015  ALLANNAH GOODHEART [birth year] [id number]  SUBJECTIVE; Telephone call to patient regarding [facility] HMO high risk referral. HIPAA verified with patient. Discussed and offered [facility] care management services to patient. Patient states she would like for me to speak with her son, Venesia Chrest regarding the services. Patient gave verbal permission to speak with her son Lunah Aroyo regarding any of her health information. Patient states her son works 12 hours shifts.  PLAN:  RNCM will attempt contact with patients son, Ling Lawrenz at patients request.  [name] RN,BSN,CCM [facility] Telephonic  [phone]

## 2015-03-29 NOTE — Patient Outreach (Signed)
[place] [facility]) Care Management  03/29/2015  [name] Dec 03, 1921 [id number]  SUBJECTIVE: Telephone call to patients son,[name] at patient request.  HIPAA verified with son for patient.  Discussed and offered [facility] care management services for patient. Patient states he is presently at work and would not be available to talk further about services until Monday or Tuesday of next week. [name] requested call back from [facility].   PLAN:   RNCM will attempt 2nd telephone outreach to patients son within 1 week.   [name] RN,BSN,CCM [facility] Telephonic  [phone]

## 2015-04-02 ENCOUNTER — Other Ambulatory Visit: Payer: Self-pay

## 2015-04-02 NOTE — Patient Outreach (Signed)
[place] [facility]) Care Management  04/02/2015  [name] 19-Mar-1921 [id number]  SUBJECTIVE:  Telephone call to patients son, [name] at patients request.  Discussed and offered [facility] care management services to son for patient. Son states patient is doing ok.  Son denies patient having any needs at this time. Son states patient's pacemaker was not working correctly. States she fell at that time.  Son states patient pacemaker has been fixed. Denies patient having any further problems with her pacemaker.  Son states he works 12 hour shifts. Son states patient has another son who has medical issues. States the other son is able to check on patient occasionally.  Son states patient lives alone and manages pretty well. Son agreed to receive information regarding community resources for in home assistance.  Son verbally agreed to receive [facility] letter/brochure for future needs.   ASSESSMENT: [employer] HMO high risk referral  PLAN: RNCM will refer patient to [name] to close due to caregiver refusal of services.   RNCM will notify patients primary MD of refusal of services. RNCM will have [name] mail patient/son [facility] for in home assistance.  RNCM will send patient/son [facility] outreach letter and brochure.   [name] RN,BSN,CCM [facility] Telephonic  [phone]

## 2015-04-10 ENCOUNTER — Encounter: Payer: Commercial Managed Care - HMO | Admitting: Internal Medicine

## 2015-04-11 ENCOUNTER — Encounter: Payer: Self-pay | Admitting: Internal Medicine

## 2015-04-22 ENCOUNTER — Other Ambulatory Visit: Payer: Self-pay | Admitting: Nurse Practitioner

## 2015-04-29 ENCOUNTER — Encounter: Payer: Self-pay | Admitting: *Deleted

## 2015-05-02 ENCOUNTER — Encounter: Payer: Self-pay | Admitting: Internal Medicine

## 2015-05-02 ENCOUNTER — Ambulatory Visit (INDEPENDENT_AMBULATORY_CARE_PROVIDER_SITE_OTHER): Payer: Commercial Managed Care - HMO | Admitting: Internal Medicine

## 2015-05-02 VITALS — BP 164/70 | HR 70 | Ht 64.0 in | Wt 153.8 lb

## 2015-05-02 DIAGNOSIS — I495 Sick sinus syndrome: Secondary | ICD-10-CM

## 2015-05-02 DIAGNOSIS — I480 Paroxysmal atrial fibrillation: Secondary | ICD-10-CM | POA: Diagnosis not present

## 2015-05-02 NOTE — Progress Notes (Signed)
HPI Ms. Birdsong returns today after a long absence from our arrhythmia clinic. She is s/p PPM insertion years ago and then reached ERI and had her new PPM placed urgently back in November. She has been stable since then. No chest pain or sob. No syncope. Allergies  Allergen Reactions  . Lisinopril     cough     Current Outpatient Prescriptions  Medication Sig Dispense Refill  . acetaminophen (TYLENOL) 500 MG tablet Take 500 mg by mouth every 6 (six) hours as needed for mild pain, moderate pain, fever or headache.    Marland Kitchen apixaban (ELIQUIS) 2.5 MG TABS tablet Take 2.5 mg by mouth 2 (two) times daily.    . calcium carbonate (OS-CAL) 600 MG TABS tablet Take 600 mg by mouth daily with breakfast.    . Cyanocobalamin (VITAMIN B-12 CR) 1500 MCG TBCR Take 1,500 mcg by mouth daily.    Marland Kitchen diltiazem (TIAZAC) 180 MG 24 hr capsule Take 180 mg by mouth daily.    . hydrALAZINE (APRESOLINE) 25 MG tablet Take 25 mg by mouth 2 (two) times daily.    . lansoprazole (PREVACID) 30 MG capsule Take 30 mg by mouth daily at 12 noon.    . levETIRAcetam (KEPPRA) 500 MG tablet Take 500 mg by mouth 2 (two) times daily.  3  . levothyroxine (SYNTHROID, LEVOTHROID) 150 MCG tablet Take 1 tablet (150 mcg total) by mouth daily before breakfast. 30 tablet 1  . loratadine (CLARITIN) 10 MG tablet Take 10 mg by mouth daily.    . mirtazapine (REMERON) 15 MG tablet Take 15 mg by mouth at bedtime.  0  . Multiple Vitamin (MULTIVITAMIN WITH MINERALS) TABS tablet Take 1 tablet by mouth daily.     No current facility-administered medications for this visit.     Past Medical History  Diagnosis Date  . Sick sinus syndrome [facility])     s/p PPM by Dr Olevia Perches (MDT) remotely, s/p generator change to [facility] 01/06/15 with Dr. Lovena Le  . Hypertension   . FHx: mastectomy     for breast cancer  . Persistent atrial fibrillation ([place])     on pradaxa  . Gastro - esophageal reflux disease   . Allergic rhinitis due to pollen   .  Other specified acquired hypothyroidism   . Body mass index 28.0-28.9, adult   . Other and unspecified hyperlipidemia   . Encounter for long-term (current) use of other medications   . Other B-complex deficiencies   . Memory loss   . Pain in limb   . Dizziness and giddiness   . Diastolic dysfunction   . Chronic anticoagulation     stopped 2015 after fall/SAH/SDH    ROS:   All systems reviewed and negative except as noted in the HPI.   Past Surgical History  Procedure Laterality Date  . Pacemaker insertion      by Dr Olevia Perches for tachy/brady syndrome (MDT)  . Mastectomy    . Tonsillectomy    . Appendectomy    . Embolectomy Left 12/30/2012    Procedure: EMBOLECTOMY LEFT LEG, with patch angioplasty;  Surgeon: Serafina Mitchell, MD;  Location: [facility] OR;  Service: Vascular;  Laterality: Left;  . Embolectomy Right 02/05/2014    Procedure: Right Brachial, Axillary, Ulnar and Radial Embolectomy;  Surgeon: Elam Dutch, MD;  Location: [place];  Service: Vascular;  Laterality: Right;  . Ep implantable device N/A 01/06/2015    Procedure: PPM/BIV PPM Generator Changeout;  Surgeon: Carleene Overlie  Peyton Najjar, MD;  Location: [place] CV LAB;  Service: Cardiovascular;  Laterality: N/A;     Family History  Problem Relation Age of Onset  . Heart disease Maternal Grandfather   . Diabetes Sister   . Tuberculosis Mother     father  . Diabetes Brother   . Cerebrovascular Accident Brother   . Stroke Brother   . Tuberculosis Father      Social History   Social History  . Marital Status: Widowed    Spouse Name: N/A  . Number of Children: 2  . Years of Education: N/A   Occupational History  . Retired    Social History Main Topics  . Smoking status: Never Smoker   . Smokeless tobacco: Not on file  . Alcohol Use: No  . Drug Use: No  . Sexual Activity: Not on file   Other Topics Concern  . Not on file   Social History Narrative   Participates in a senior games.       BP 164/70 mmHg   Pulse 70  Ht 5\' 4"  (1.626 m)  Wt 153 lb 12.8 oz (69.763 kg)  BMI 26.39 kg/m2  Physical Exam:  Well appearing NAD HEENT: Unremarkable Neck:  No JVD, no thyromegally Lymphatics:  No adenopathy Back:  No CVA tenderness Lungs:  Clear with no wheezes HEART:  Regular rate rhythm, no murmurs, no rubs, no clicks Abd:  soft, positive bowel sounds, no organomegally, no rebound, no guarding Ext:  2 plus pulses, no edema, no cyanosis, no clubbing Skin:  No rashes no nodules Neuro:  CN II through XII intact, motor grossly intact   DEVICE  Normal device function.  See PaceArt for details.   Assess/Plan: 1. Complete heart block - she is doing well, s/p PPM insertion 2. HTN - her systolic blood pressure is elevated. She is encouraged to maintain a low sodium diet. 3. Atrial fib - she is maintaining nsr. No change in meds.  Mikle Bosworth.D.

## 2015-05-02 NOTE — Patient Instructions (Signed)
Medication Instructions:  Your physician recommends that you continue on your current medications as directed. Please refer to the Current Medication list given to you today.   Labwork: None ordered   Testing/Procedures: None ordered   Follow-Up: Your physician wants you to follow-up in: December of 2017 with Dr Knox Saliva will receive a reminder letter in the mail two months in advance. If you don't receive a letter, please call our office to schedule the follow-up appointment.   Remote monitoring is used to monitor your Pacemaker  from home. This monitoring reduces the number of office visits required to check your device to one time per year. It allows [place] to keep an eye on the functioning of your device to ensure it is working properly. You are scheduled for a device check from home on 08/01/2015. You may send your transmission at any time that day. If you have a wireless device, the transmission will be sent automatically. After your physician reviews your transmission, you will receive a postcard with your next transmission date.    Any Other Special Instructions Will Be Listed Below (If Applicable).     If you need a refill on your cardiac medications before your next appointment, please call your pharmacy.

## 2015-05-10 LAB — CUP PACEART INCLINIC DEVICE CHECK
Implantable Lead Implant Date: 20080502
Implantable Lead Location: 753859
Lead Channel Impedance Value: 460 Ohm
Lead Channel Pacing Threshold Amplitude: 0.3 V
Lead Channel Pacing Threshold Pulse Width: 0.4 ms
Lead Channel Pacing Threshold Pulse Width: 0.4 ms
Lead Channel Setting Pacing Amplitude: 2.5 V
MDC IDC LEAD IMPLANT DT: 20080502
MDC IDC LEAD LOCATION: 753860
MDC IDC MSMT LEADCHNL RA IMPEDANCE VALUE: 609 Ohm
MDC IDC MSMT LEADCHNL RA SENSING INTR AMPL: 1.5 mV
MDC IDC MSMT LEADCHNL RV PACING THRESHOLD AMPLITUDE: 0.8 V
MDC IDC SESS DTM: 20170323040000
MDC IDC SET LEADCHNL RV PACING AMPLITUDE: 1.3 V
MDC IDC SET LEADCHNL RV PACING PULSEWIDTH: 0.4 ms
MDC IDC SET LEADCHNL RV SENSING SENSITIVITY: 2.5 mV
MDC IDC STAT BRADY RA PERCENT PACED: 90 %
MDC IDC STAT BRADY RV PERCENT PACED: 100 %
Pulse Gen Serial Number: 737493

## 2015-06-25 DIAGNOSIS — E119 Type 2 diabetes mellitus without complications: Secondary | ICD-10-CM | POA: Diagnosis not present

## 2015-06-25 DIAGNOSIS — E038 Other specified hypothyroidism: Secondary | ICD-10-CM | POA: Diagnosis not present

## 2015-06-25 DIAGNOSIS — K219 Gastro-esophageal reflux disease without esophagitis: Secondary | ICD-10-CM | POA: Diagnosis not present

## 2015-06-25 DIAGNOSIS — K55069 Acute infarction of intestine, part and extent unspecified: Secondary | ICD-10-CM | POA: Diagnosis not present

## 2015-06-25 DIAGNOSIS — E663 Overweight: Secondary | ICD-10-CM | POA: Diagnosis not present

## 2015-06-25 DIAGNOSIS — E785 Hyperlipidemia, unspecified: Secondary | ICD-10-CM | POA: Diagnosis not present

## 2015-06-25 DIAGNOSIS — E559 Vitamin D deficiency, unspecified: Secondary | ICD-10-CM | POA: Diagnosis not present

## 2015-06-25 DIAGNOSIS — I4891 Unspecified atrial fibrillation: Secondary | ICD-10-CM | POA: Diagnosis not present

## 2015-06-25 DIAGNOSIS — I10 Essential (primary) hypertension: Secondary | ICD-10-CM | POA: Diagnosis not present

## 2015-06-25 DIAGNOSIS — Z79899 Other long term (current) drug therapy: Secondary | ICD-10-CM | POA: Diagnosis not present

## 2015-06-25 DIAGNOSIS — Z298 Encounter for other specified prophylactic measures: Secondary | ICD-10-CM | POA: Diagnosis not present

## 2015-07-23 ENCOUNTER — Telehealth: Payer: Self-pay | Admitting: Internal Medicine

## 2015-07-23 NOTE — Telephone Encounter (Signed)
New Message  Pt wanted to have device checked- is due to come back Sept/2017 for Dr Lovena Le- wanted to discuss her device w/ RN before making an appt. Please call back and discuss.

## 2015-07-23 NOTE — Telephone Encounter (Signed)
Spoke to [name]- she was curious about how [name] she had left on her battery and when she needed to come back to have it checked. In march the battery longevity was 10.5 years and she is not due to see Dr. Lovena Le until September. She is aware she will receive a recall letter.

## 2015-09-25 DIAGNOSIS — E119 Type 2 diabetes mellitus without complications: Secondary | ICD-10-CM | POA: Diagnosis not present

## 2015-09-25 DIAGNOSIS — E559 Vitamin D deficiency, unspecified: Secondary | ICD-10-CM | POA: Diagnosis not present

## 2015-09-25 DIAGNOSIS — I743 Embolism and thrombosis of arteries of the lower extremities: Secondary | ICD-10-CM | POA: Diagnosis not present

## 2015-09-25 DIAGNOSIS — Z298 Encounter for other specified prophylactic measures: Secondary | ICD-10-CM | POA: Diagnosis not present

## 2015-09-25 DIAGNOSIS — E785 Hyperlipidemia, unspecified: Secondary | ICD-10-CM | POA: Diagnosis not present

## 2015-09-25 DIAGNOSIS — E038 Other specified hypothyroidism: Secondary | ICD-10-CM | POA: Diagnosis not present

## 2015-09-25 DIAGNOSIS — K55069 Acute infarction of intestine, part and extent unspecified: Secondary | ICD-10-CM | POA: Diagnosis not present

## 2015-09-25 DIAGNOSIS — E538 Deficiency of other specified B group vitamins: Secondary | ICD-10-CM | POA: Diagnosis not present

## 2015-09-25 DIAGNOSIS — Z79899 Other long term (current) drug therapy: Secondary | ICD-10-CM | POA: Diagnosis not present

## 2015-09-25 DIAGNOSIS — R413 Other amnesia: Secondary | ICD-10-CM | POA: Diagnosis not present

## 2015-09-25 DIAGNOSIS — Z6826 Body mass index (BMI) 26.0-26.9, adult: Secondary | ICD-10-CM | POA: Diagnosis not present

## 2015-10-17 DIAGNOSIS — I5021 Acute systolic (congestive) heart failure: Secondary | ICD-10-CM | POA: Diagnosis not present

## 2015-10-17 DIAGNOSIS — I517 Cardiomegaly: Secondary | ICD-10-CM | POA: Diagnosis not present

## 2015-10-17 DIAGNOSIS — I509 Heart failure, unspecified: Secondary | ICD-10-CM | POA: Diagnosis not present

## 2015-10-17 DIAGNOSIS — F0280 Dementia in other diseases classified elsewhere without behavioral disturbance: Secondary | ICD-10-CM | POA: Diagnosis not present

## 2015-10-17 DIAGNOSIS — I5033 Acute on chronic diastolic (congestive) heart failure: Secondary | ICD-10-CM | POA: Diagnosis not present

## 2015-10-17 DIAGNOSIS — G309 Alzheimer's disease, unspecified: Secondary | ICD-10-CM | POA: Diagnosis not present

## 2015-10-17 DIAGNOSIS — R079 Chest pain, unspecified: Secondary | ICD-10-CM | POA: Diagnosis not present

## 2015-10-17 DIAGNOSIS — E039 Hypothyroidism, unspecified: Secondary | ICD-10-CM | POA: Diagnosis not present

## 2015-10-17 DIAGNOSIS — I110 Hypertensive heart disease with heart failure: Secondary | ICD-10-CM | POA: Diagnosis not present

## 2015-10-17 DIAGNOSIS — I119 Hypertensive heart disease without heart failure: Secondary | ICD-10-CM | POA: Diagnosis not present

## 2015-10-17 DIAGNOSIS — Z950 Presence of cardiac pacemaker: Secondary | ICD-10-CM | POA: Diagnosis not present

## 2015-10-17 DIAGNOSIS — K219 Gastro-esophageal reflux disease without esophagitis: Secondary | ICD-10-CM | POA: Diagnosis not present

## 2015-10-17 DIAGNOSIS — I4891 Unspecified atrial fibrillation: Secondary | ICD-10-CM | POA: Diagnosis not present

## 2015-10-17 DIAGNOSIS — R0602 Shortness of breath: Secondary | ICD-10-CM | POA: Diagnosis not present

## 2015-10-17 DIAGNOSIS — I10 Essential (primary) hypertension: Secondary | ICD-10-CM | POA: Diagnosis not present

## 2015-10-17 DIAGNOSIS — I495 Sick sinus syndrome: Secondary | ICD-10-CM | POA: Diagnosis not present

## 2015-10-21 DIAGNOSIS — Z298 Encounter for other specified prophylactic measures: Secondary | ICD-10-CM | POA: Diagnosis not present

## 2015-10-21 DIAGNOSIS — Z950 Presence of cardiac pacemaker: Secondary | ICD-10-CM | POA: Diagnosis not present

## 2015-10-21 DIAGNOSIS — I509 Heart failure, unspecified: Secondary | ICD-10-CM | POA: Diagnosis not present

## 2015-10-21 DIAGNOSIS — I4891 Unspecified atrial fibrillation: Secondary | ICD-10-CM | POA: Diagnosis not present

## 2015-10-21 DIAGNOSIS — E538 Deficiency of other specified B group vitamins: Secondary | ICD-10-CM | POA: Diagnosis not present

## 2015-10-21 DIAGNOSIS — E038 Other specified hypothyroidism: Secondary | ICD-10-CM | POA: Diagnosis not present

## 2015-10-21 DIAGNOSIS — Z79899 Other long term (current) drug therapy: Secondary | ICD-10-CM | POA: Diagnosis not present

## 2015-10-21 DIAGNOSIS — E119 Type 2 diabetes mellitus without complications: Secondary | ICD-10-CM | POA: Diagnosis not present

## 2015-10-21 DIAGNOSIS — R413 Other amnesia: Secondary | ICD-10-CM | POA: Diagnosis not present

## 2015-10-21 DIAGNOSIS — K219 Gastro-esophageal reflux disease without esophagitis: Secondary | ICD-10-CM | POA: Diagnosis not present

## 2015-10-23 ENCOUNTER — Encounter: Payer: Self-pay | Admitting: Internal Medicine

## 2015-10-25 ENCOUNTER — Encounter: Payer: Commercial Managed Care - HMO | Admitting: Internal Medicine

## 2016-01-29 DIAGNOSIS — E559 Vitamin D deficiency, unspecified: Secondary | ICD-10-CM | POA: Diagnosis not present

## 2016-01-29 DIAGNOSIS — E119 Type 2 diabetes mellitus without complications: Secondary | ICD-10-CM | POA: Diagnosis not present

## 2016-01-29 DIAGNOSIS — Z6826 Body mass index (BMI) 26.0-26.9, adult: Secondary | ICD-10-CM | POA: Diagnosis not present

## 2016-01-29 DIAGNOSIS — I4891 Unspecified atrial fibrillation: Secondary | ICD-10-CM | POA: Diagnosis not present

## 2016-01-29 DIAGNOSIS — E538 Deficiency of other specified B group vitamins: Secondary | ICD-10-CM | POA: Diagnosis not present

## 2016-01-29 DIAGNOSIS — Z950 Presence of cardiac pacemaker: Secondary | ICD-10-CM | POA: Diagnosis not present

## 2016-01-29 DIAGNOSIS — I50810 Right heart failure, unspecified: Secondary | ICD-10-CM | POA: Diagnosis not present

## 2016-01-29 DIAGNOSIS — Z79899 Other long term (current) drug therapy: Secondary | ICD-10-CM | POA: Diagnosis not present

## 2016-01-29 DIAGNOSIS — Z298 Encounter for other specified prophylactic measures: Secondary | ICD-10-CM | POA: Diagnosis not present

## 2016-01-29 DIAGNOSIS — E063 Autoimmune thyroiditis: Secondary | ICD-10-CM | POA: Diagnosis not present

## 2016-01-29 DIAGNOSIS — K219 Gastro-esophageal reflux disease without esophagitis: Secondary | ICD-10-CM | POA: Diagnosis not present

## 2016-05-19 ENCOUNTER — Telehealth: Payer: Self-pay | Admitting: Internal Medicine

## 2016-05-19 NOTE — Telephone Encounter (Signed)
Spoke to patient regarding her ppm. Patient states that she thinks her ppm is getting "weaker" bc she's getting weaker. Patient states that she's been feeling weak for a long time. When asked about how long she's been feeling weak she stated that it had been going on for several months. She also said that she couldn't be sure bc her "mind was getting off". Patient states that she lives alone and typically eats breakfast and then her son will take her out for dinner. She said that she drinks juice throughout the day as well. I encouraged her to make sure that she continues to eat and drink to avoid further weakness. I asked her if she had seen her PCP lately. Patient states that she hasn't seen anybody in years.  She said that when she was in the hospital years ago she was told that she only had 6 years remaining on her device. I informed patient that last year her device estimated that she had 10.5 years left on it. Patient voiced understanding.  I told her that I would have a scheduler to call her about setting up an appt with Dr.Taylor since she is overdue. I advised her to go to ER if weakness worsens. Patient verbalized understanding.

## 2016-05-19 NOTE — Telephone Encounter (Signed)
New message      Pt states that her pacemaker is "not right".  Please call

## 2016-05-20 DIAGNOSIS — E785 Hyperlipidemia, unspecified: Secondary | ICD-10-CM | POA: Diagnosis not present

## 2016-05-20 DIAGNOSIS — Z298 Encounter for other specified prophylactic measures: Secondary | ICD-10-CM | POA: Diagnosis not present

## 2016-05-20 DIAGNOSIS — Z950 Presence of cardiac pacemaker: Secondary | ICD-10-CM | POA: Diagnosis not present

## 2016-05-20 DIAGNOSIS — E063 Autoimmune thyroiditis: Secondary | ICD-10-CM | POA: Diagnosis not present

## 2016-05-20 DIAGNOSIS — K55069 Acute infarction of intestine, part and extent unspecified: Secondary | ICD-10-CM | POA: Diagnosis not present

## 2016-05-20 DIAGNOSIS — I744 Embolism and thrombosis of arteries of extremities, unspecified: Secondary | ICD-10-CM | POA: Diagnosis not present

## 2016-05-20 DIAGNOSIS — R413 Other amnesia: Secondary | ICD-10-CM | POA: Diagnosis not present

## 2016-05-20 DIAGNOSIS — I4891 Unspecified atrial fibrillation: Secondary | ICD-10-CM | POA: Diagnosis not present

## 2016-05-20 DIAGNOSIS — I10 Essential (primary) hypertension: Secondary | ICD-10-CM | POA: Diagnosis not present

## 2016-05-20 DIAGNOSIS — E119 Type 2 diabetes mellitus without complications: Secondary | ICD-10-CM | POA: Diagnosis not present

## 2016-07-30 ENCOUNTER — Encounter: Payer: Self-pay | Admitting: Internal Medicine

## 2016-08-17 ENCOUNTER — Encounter: Payer: Medicare HMO | Admitting: Internal Medicine

## 2016-08-19 ENCOUNTER — Encounter: Payer: Self-pay | Admitting: Internal Medicine

## 2016-08-19 DIAGNOSIS — E559 Vitamin D deficiency, unspecified: Secondary | ICD-10-CM | POA: Diagnosis not present

## 2016-08-19 DIAGNOSIS — E663 Overweight: Secondary | ICD-10-CM | POA: Diagnosis not present

## 2016-08-19 DIAGNOSIS — E538 Deficiency of other specified B group vitamins: Secondary | ICD-10-CM | POA: Diagnosis not present

## 2016-08-19 DIAGNOSIS — Z79899 Other long term (current) drug therapy: Secondary | ICD-10-CM | POA: Diagnosis not present

## 2016-08-19 DIAGNOSIS — R413 Other amnesia: Secondary | ICD-10-CM | POA: Diagnosis not present

## 2016-08-19 DIAGNOSIS — I4891 Unspecified atrial fibrillation: Secondary | ICD-10-CM | POA: Diagnosis not present

## 2016-08-19 DIAGNOSIS — Z950 Presence of cardiac pacemaker: Secondary | ICD-10-CM | POA: Diagnosis not present

## 2016-08-19 DIAGNOSIS — E063 Autoimmune thyroiditis: Secondary | ICD-10-CM | POA: Diagnosis not present

## 2016-08-19 DIAGNOSIS — E785 Hyperlipidemia, unspecified: Secondary | ICD-10-CM | POA: Diagnosis not present

## 2016-08-19 DIAGNOSIS — E119 Type 2 diabetes mellitus without complications: Secondary | ICD-10-CM | POA: Diagnosis not present

## 2016-08-19 DIAGNOSIS — Z298 Encounter for other specified prophylactic measures: Secondary | ICD-10-CM | POA: Diagnosis not present

## 2016-09-02 NOTE — Progress Notes (Signed)
Cardiology Office Note Date:  09/03/2016  Patient ID:  [name] 03-Jul-1921, MRN [id number] PCP:  Ocie Doyne., MD  Cardiologist:  Dr. Lovena Le    Chief Complaint: over due annual EP/device visit  History of Present Illness: [name] is a 83 y.o. female with history of SSSx, CHB s/p PPM with gen change in 2016, HTN, AFib, breast ca treated surgically comes in today to be seen for Dr. Lovena Le, last seen by him in March 2017, at that time note mention of having been lost to follow up for some years until urgent gen change in Nov 2016.  She comes in today accompanied by her son.  She continues to live on her own, they denies any difficulty, no falls.  There is a note regarding c/o weakness and concerns of her battery.  She denies dizziness, near syncope or syncope, her son states this is more of a weakness in her legs on occasion, no CP, palpitations or SOB.  Device information: BSci dual chamber PPM, implanted 2008, gen change 01/06/15, dr. Lovena Le   Past Medical History:  Diagnosis Date  . Allergic rhinitis due to pollen   . Body mass index 28.0-28.9, adult   . Chronic anticoagulation    stopped 2015 after fall/SAH/SDH  . Diastolic dysfunction   . Dizziness and giddiness   . Encounter for long-term (current) use of other medications   . FHx: mastectomy    for breast cancer  . Gastro - esophageal reflux disease   . Hypertension   . Memory loss   . Other and unspecified hyperlipidemia   . Other B-complex deficiencies   . Other specified acquired hypothyroidism   . Pain in limb   . Persistent atrial fibrillation ([place])    on pradaxa  . Sick sinus syndrome [facility])    s/p PPM by Dr Olevia Perches (MDT) remotely, s/p generator change to [facility] 01/06/15 with Dr. Lovena Le    Past Surgical History:  Procedure Laterality Date  . APPENDECTOMY    . EMBOLECTOMY Left 12/30/2012   Procedure: EMBOLECTOMY LEFT LEG, with patch angioplasty;  Surgeon: Serafina Mitchell, MD;   Location: [place];  Service: Vascular;  Laterality: Left;  . EMBOLECTOMY Right 02/05/2014   Procedure: Right Brachial, Axillary, Ulnar and Radial Embolectomy;  Surgeon: Elam Dutch, MD;  Location: [place];  Service: Vascular;  Laterality: Right;  . EP IMPLANTABLE DEVICE N/A 01/06/2015   Procedure: PPM/BIV PPM Generator Changeout;  Surgeon: Evans Lance, MD;  Location: [place] [facility] LAB;  Service: Cardiovascular;  Laterality: N/A;  . mastectomy    . PACEMAKER INSERTION     by Dr Olevia Perches for tachy/brady syndrome (MDT)  . TONSILLECTOMY      Current Outpatient Prescriptions  Medication Sig Dispense Refill  . apixaban (ELIQUIS) 2.5 MG TABS tablet Take 2.5 mg by mouth 2 (two) times daily.    . calcium carbonate (OS-CAL) 600 MG TABS tablet Take 600 mg by mouth daily with breakfast.    . Cyanocobalamin (VITAMIN B-12 CR) 1500 MCG TBCR Take 1,500 mcg by mouth daily.    Marland Kitchen diltiazem (TIAZAC) 180 MG 24 hr capsule Take 180 mg by mouth daily.    Marland Kitchen donepezil (ARICEPT) 10 MG tablet Take 10 mg by mouth daily.  3  . lansoprazole (PREVACID) 30 MG capsule Take 30 mg by mouth daily at 12 noon.    . levETIRAcetam (KEPPRA) 500 MG tablet Take 500 mg by mouth 2 (two) times daily.  3  .  levothyroxine (SYNTHROID, LEVOTHROID) 100 MCG tablet Take 100 mcg by mouth daily.  3   No current facility-administered medications for this visit.     Allergies:   Lisinopril   Social History:  The patient  reports that she has never smoked. She has never used smokeless tobacco. She reports that she does not drink alcohol or use drugs.   Family History:  The patient's family history includes Cerebrovascular Accident in her brother; Diabetes in her brother and sister; Heart disease in her maternal grandfather; Stroke in her brother; Tuberculosis in her father and mother.  ROS:  Please see the history of present illness.   All other systems are reviewed and otherwise negative.   PHYSICAL EXAM: VS:  BP (!) 144/72   Pulse 76    Ht 5\' 4"  (1.626 m)   Wt 153 lb (69.4 kg)   BMI 26.26 kg/m  BMI: Body mass index is 26.26 kg/m. Well nourished, well developed, in no acute distress  HEENT: normocephalic, atraumatic  Neck: no JVD, carotid bruits or masses Cardiac: RRR; no significant murmurs, no rubs, or gallops Lungs:  CTA b/l, no wheezing, rhonchi or rales  Abd: soft, nontender MS: no deformity, age appropriate atrophy Ext: no edema  Skin: warm and dry, no rash Neuro:  No gross deficits appreciated Psych: euthymic mood, full affect  PPM site is stable, no tethering or discomfort   EKG:  Done today and reviewed by myself shows AV paced PPM interrogation done today by industry and reviewed by myself: battery and lead measurements are good, device dependent today  01/08/15: TTE Study Conclusions - Left ventricle: The cavity size was normal. Wall thickness was   increased in a pattern of moderate LVH. Systolic function was   normal. The estimated ejection fraction was in the range of 50%   to 55%. Wall motion was normal; there were no regional wall   motion abnormalities. Doppler parameters are consistent with high   ventricular filling pressure. - Ventricular septum: Septal motion showed abnormal function and   dyssynergy. - Mitral valve: There was mild regurgitation. - Left atrium: The atrium was mildly dilated. - Right atrium: The atrium was mildly dilated. - Pericardium, extracardiac: A small pericardial effusion was   identified. There was a left pleural effusion. Impressions: - Low normal LV function; moderate LVH; elevated LV filling   pressure; mild biatrial enlargement; mild MR; small pericardial   effusion; left pleural effusion.  Recent Labs: No results found for requested labs within last 8760 hours.  No results found for requested labs within last 8760 hours.   CrCl cannot be calculated (Patient's most recent lab result is older than the maximum 21 days allowed.).   Wt Readings from Last  3 Encounters:  09/03/16 153 lb (69.4 kg)  05/02/15 153 lb 12.8 oz (69.8 kg)  01/08/15 163 lb (73.9 kg)     Other studies reviewed: Additional studies/records reviewed today include: summarized above  ASSESSMENT AND PLAN:  1. CHB w/PPM     Normal device function, no changes made  2. Paroxysmal Afib     CHA2DS2Vasc is 4, on Eliquis, will need updated creat, currently on low dose     One HVR noted, looks AT, conducted, 14 seconds  3. HTN     No changes today   Disposition: BMET and CBC today, f/u with q 3 month remotes and RTC in 1 year, sooner if needed  Current medicines are reviewed at length with the patient today.  The patient  did not have any concerns regarding medicines.  Haywood Lasso, PA-C 09/03/2016 12:48 PM     [place] [address] [phone] (office)  [phone] (fax)

## 2016-09-03 ENCOUNTER — Ambulatory Visit (INDEPENDENT_AMBULATORY_CARE_PROVIDER_SITE_OTHER): Payer: Medicare HMO | Admitting: Physician Assistant

## 2016-09-03 VITALS — BP 144/72 | HR 76 | Ht 64.0 in | Wt 153.0 lb

## 2016-09-03 DIAGNOSIS — Z950 Presence of cardiac pacemaker: Secondary | ICD-10-CM | POA: Diagnosis not present

## 2016-09-03 DIAGNOSIS — Z79899 Other long term (current) drug therapy: Secondary | ICD-10-CM

## 2016-09-03 DIAGNOSIS — I10 Essential (primary) hypertension: Secondary | ICD-10-CM | POA: Diagnosis not present

## 2016-09-03 DIAGNOSIS — I480 Paroxysmal atrial fibrillation: Secondary | ICD-10-CM

## 2016-09-03 LAB — CUP PACEART INCLINIC DEVICE CHECK
Date Time Interrogation Session: 20180726040000
Implantable Lead Location: 753860
Implantable Lead Model: 5076
Lead Channel Pacing Threshold Pulse Width: 0.4 ms
Lead Channel Setting Pacing Amplitude: 2.5 V
Lead Channel Setting Pacing Pulse Width: 0.4 ms
Lead Channel Setting Sensing Sensitivity: 2.5 mV
MDC IDC LEAD IMPLANT DT: 20080502
MDC IDC LEAD IMPLANT DT: 20080502
MDC IDC LEAD LOCATION: 753859
MDC IDC MSMT LEADCHNL RA IMPEDANCE VALUE: 582 Ohm
MDC IDC MSMT LEADCHNL RA PACING THRESHOLD AMPLITUDE: 0.2 V
MDC IDC MSMT LEADCHNL RA PACING THRESHOLD PULSEWIDTH: 0.4 ms
MDC IDC MSMT LEADCHNL RV IMPEDANCE VALUE: 541 Ohm
MDC IDC MSMT LEADCHNL RV PACING THRESHOLD AMPLITUDE: 1 V
MDC IDC PG IMPLANT DT: 20161127
MDC IDC PG SERIAL: 737493
MDC IDC SET LEADCHNL RV PACING AMPLITUDE: 1.5 V

## 2016-09-03 LAB — BASIC METABOLIC PANEL
BUN / CREAT RATIO: 12 (ref 12–28)
BUN: 14 mg/dL (ref 10–36)
CHLORIDE: 106 mmol/L (ref 96–106)
CO2: 23 mmol/L (ref 20–29)
Calcium: 10.6 mg/dL — ABNORMAL HIGH (ref 8.7–10.3)
Creatinine, Ser: 1.15 mg/dL — ABNORMAL HIGH (ref 0.57–1.00)
GFR calc non Af Amer: 41 mL/min/{1.73_m2} — ABNORMAL LOW (ref >59)
GFR, EST AFRICAN AMERICAN: 47 mL/min/{1.73_m2} — AB (ref >59)
GLUCOSE: 96 mg/dL (ref 65–99)
Potassium: 4 mmol/L (ref 3.5–5.2)
SODIUM: 145 mmol/L — AB (ref 134–144)

## 2016-09-03 LAB — CBC
Hematocrit: 39.5 % (ref 34.0–46.6)
Hemoglobin: 13.4 g/dL (ref 11.1–15.9)
MCH: 29.6 pg (ref 26.6–33.0)
MCHC: 33.9 g/dL (ref 31.5–35.7)
MCV: 87 fL (ref 79–97)
PLATELETS: 178 10*3/uL (ref 150–379)
RBC: 4.52 x10E6/uL (ref 3.77–5.28)
RDW: 14.1 % (ref 12.3–15.4)
WBC: 7.7 10*3/uL (ref 3.4–10.8)

## 2016-09-03 NOTE — Patient Instructions (Signed)
Medication Instructions:   Your physician recommends that you continue on your current medications as directed. Please refer to the Current Medication list given to you today.   If you need a refill on your cardiac medications before your next appointment, please call your pharmacy.  Labwork: BMET AND CBC TODAY    Testing/Procedures: NONE ORDERED  TODAY    Follow-Up: Your physician wants you to follow-up in: [place] will receive a reminder letter in the mail two months in advance. If you don't receive a letter, please call our office to schedule the follow-up appointment.  Remote monitoring is used to monitor your Pacemaker of ICD from home. This monitoring reduces the number of office visits required to check your device to one time per year. It allows [place] to keep an eye on the functioning of your device to ensure it is working properly. You are scheduled for a device check from home on . 12-03-16 You may send your transmission at any time that day. If you have a wireless device, the transmission will be sent automatically. After your physician reviews your transmission, you will receive a postcard with your next transmission date.     Any Other Special Instructions Will Be Listed Below (If Applicable).

## 2016-12-03 ENCOUNTER — Ambulatory Visit (INDEPENDENT_AMBULATORY_CARE_PROVIDER_SITE_OTHER): Payer: Medicare HMO | Admitting: *Deleted

## 2016-12-03 DIAGNOSIS — I495 Sick sinus syndrome: Secondary | ICD-10-CM

## 2016-12-03 NOTE — Progress Notes (Signed)
Remote pacemaker transmission.   

## 2016-12-04 ENCOUNTER — Encounter: Payer: Self-pay | Admitting: Cardiology

## 2016-12-04 LAB — CUP PACEART REMOTE DEVICE CHECK
Brady Statistic RA Percent Paced: 67 %
Brady Statistic RV Percent Paced: 100 %
Date Time Interrogation Session: 20181025080100
Implantable Lead Implant Date: 20080502
Implantable Lead Location: 753859
Implantable Lead Location: 753860
Implantable Lead Model: 5076
Implantable Pulse Generator Implant Date: 20161127
Lead Channel Impedance Value: 538 Ohm
Lead Channel Impedance Value: 580 Ohm
Lead Channel Pacing Threshold Pulse Width: 0.4 ms
Lead Channel Setting Pacing Amplitude: 2.5 V
MDC IDC LEAD IMPLANT DT: 20080502
MDC IDC MSMT BATTERY REMAINING LONGEVITY: 66 mo
MDC IDC MSMT BATTERY REMAINING PERCENTAGE: 100 %
MDC IDC MSMT LEADCHNL RV PACING THRESHOLD AMPLITUDE: 0.9 V
MDC IDC SET LEADCHNL RV PACING AMPLITUDE: 1.5 V
MDC IDC SET LEADCHNL RV PACING PULSEWIDTH: 0.4 ms
MDC IDC SET LEADCHNL RV SENSING SENSITIVITY: 2.5 mV
Pulse Gen Serial Number: 737493

## 2016-12-22 DIAGNOSIS — E119 Type 2 diabetes mellitus without complications: Secondary | ICD-10-CM | POA: Diagnosis not present

## 2016-12-22 DIAGNOSIS — K219 Gastro-esophageal reflux disease without esophagitis: Secondary | ICD-10-CM | POA: Diagnosis not present

## 2016-12-22 DIAGNOSIS — Z298 Encounter for other specified prophylactic measures: Secondary | ICD-10-CM | POA: Diagnosis not present

## 2016-12-22 DIAGNOSIS — Z6826 Body mass index (BMI) 26.0-26.9, adult: Secondary | ICD-10-CM | POA: Diagnosis not present

## 2016-12-22 DIAGNOSIS — Z23 Encounter for immunization: Secondary | ICD-10-CM | POA: Diagnosis not present

## 2016-12-22 DIAGNOSIS — R413 Other amnesia: Secondary | ICD-10-CM | POA: Diagnosis not present

## 2016-12-22 DIAGNOSIS — E785 Hyperlipidemia, unspecified: Secondary | ICD-10-CM | POA: Diagnosis not present

## 2016-12-22 DIAGNOSIS — Z950 Presence of cardiac pacemaker: Secondary | ICD-10-CM | POA: Diagnosis not present

## 2016-12-22 DIAGNOSIS — E559 Vitamin D deficiency, unspecified: Secondary | ICD-10-CM | POA: Diagnosis not present

## 2016-12-22 DIAGNOSIS — E538 Deficiency of other specified B group vitamins: Secondary | ICD-10-CM | POA: Diagnosis not present

## 2016-12-22 DIAGNOSIS — E063 Autoimmune thyroiditis: Secondary | ICD-10-CM | POA: Diagnosis not present

## 2017-01-08 DIAGNOSIS — Z6826 Body mass index (BMI) 26.0-26.9, adult: Secondary | ICD-10-CM | POA: Diagnosis not present

## 2017-01-08 DIAGNOSIS — N289 Disorder of kidney and ureter, unspecified: Secondary | ICD-10-CM | POA: Diagnosis not present

## 2017-03-04 ENCOUNTER — Ambulatory Visit (INDEPENDENT_AMBULATORY_CARE_PROVIDER_SITE_OTHER): Payer: Medicare HMO | Admitting: *Deleted

## 2017-03-04 DIAGNOSIS — I495 Sick sinus syndrome: Secondary | ICD-10-CM

## 2017-03-04 NOTE — Progress Notes (Signed)
Remote pacemaker transmission.   

## 2017-03-05 ENCOUNTER — Encounter: Payer: Self-pay | Admitting: Cardiology

## 2017-03-24 LAB — CUP PACEART REMOTE DEVICE CHECK
Battery Remaining Percentage: 95 %
Date Time Interrogation Session: 20190124125200
Implantable Lead Implant Date: 20080502
Implantable Lead Implant Date: 20080502
Implantable Lead Location: 753859
Implantable Lead Model: 5076
Implantable Pulse Generator Implant Date: 20161127
Lead Channel Impedance Value: 598 Ohm
Lead Channel Impedance Value: 611 Ohm
Lead Channel Pacing Threshold Pulse Width: 0.4 ms
Lead Channel Setting Sensing Sensitivity: 2.5 mV
MDC IDC LEAD LOCATION: 753860
MDC IDC MSMT BATTERY REMAINING LONGEVITY: 60 mo
MDC IDC MSMT LEADCHNL RV PACING THRESHOLD AMPLITUDE: 0.9 V
MDC IDC SET LEADCHNL RA PACING AMPLITUDE: 2.5 V
MDC IDC SET LEADCHNL RV PACING AMPLITUDE: 1.4 V
MDC IDC SET LEADCHNL RV PACING PULSEWIDTH: 0.4 ms
MDC IDC STAT BRADY RA PERCENT PACED: 78 %
MDC IDC STAT BRADY RV PERCENT PACED: 100 %
Pulse Gen Serial Number: 737493

## 2017-03-29 DIAGNOSIS — I10 Essential (primary) hypertension: Secondary | ICD-10-CM | POA: Diagnosis not present

## 2017-03-29 DIAGNOSIS — Z298 Encounter for other specified prophylactic measures: Secondary | ICD-10-CM | POA: Diagnosis not present

## 2017-03-29 DIAGNOSIS — E063 Autoimmune thyroiditis: Secondary | ICD-10-CM | POA: Diagnosis not present

## 2017-03-29 DIAGNOSIS — E559 Vitamin D deficiency, unspecified: Secondary | ICD-10-CM | POA: Diagnosis not present

## 2017-03-29 DIAGNOSIS — I4891 Unspecified atrial fibrillation: Secondary | ICD-10-CM | POA: Diagnosis not present

## 2017-03-29 DIAGNOSIS — E785 Hyperlipidemia, unspecified: Secondary | ICD-10-CM | POA: Diagnosis not present

## 2017-03-29 DIAGNOSIS — E119 Type 2 diabetes mellitus without complications: Secondary | ICD-10-CM | POA: Diagnosis not present

## 2017-03-29 DIAGNOSIS — I744 Embolism and thrombosis of arteries of extremities, unspecified: Secondary | ICD-10-CM | POA: Diagnosis not present

## 2017-03-29 DIAGNOSIS — Z79899 Other long term (current) drug therapy: Secondary | ICD-10-CM | POA: Diagnosis not present

## 2017-03-29 DIAGNOSIS — R413 Other amnesia: Secondary | ICD-10-CM | POA: Diagnosis not present

## 2017-06-03 ENCOUNTER — Ambulatory Visit (INDEPENDENT_AMBULATORY_CARE_PROVIDER_SITE_OTHER): Payer: Medicare HMO | Admitting: *Deleted

## 2017-06-03 DIAGNOSIS — I495 Sick sinus syndrome: Secondary | ICD-10-CM | POA: Diagnosis not present

## 2017-06-04 ENCOUNTER — Encounter: Payer: Self-pay | Admitting: Cardiology

## 2017-06-04 NOTE — Progress Notes (Signed)
Remote pacemaker transmission.   

## 2017-06-30 DIAGNOSIS — I10 Essential (primary) hypertension: Secondary | ICD-10-CM | POA: Diagnosis not present

## 2017-06-30 DIAGNOSIS — E559 Vitamin D deficiency, unspecified: Secondary | ICD-10-CM | POA: Diagnosis not present

## 2017-06-30 DIAGNOSIS — Z298 Encounter for other specified prophylactic measures: Secondary | ICD-10-CM | POA: Diagnosis not present

## 2017-06-30 DIAGNOSIS — R413 Other amnesia: Secondary | ICD-10-CM | POA: Diagnosis not present

## 2017-06-30 DIAGNOSIS — E119 Type 2 diabetes mellitus without complications: Secondary | ICD-10-CM | POA: Diagnosis not present

## 2017-06-30 DIAGNOSIS — K219 Gastro-esophageal reflux disease without esophagitis: Secondary | ICD-10-CM | POA: Diagnosis not present

## 2017-06-30 DIAGNOSIS — Z1331 Encounter for screening for depression: Secondary | ICD-10-CM | POA: Diagnosis not present

## 2017-06-30 DIAGNOSIS — E063 Autoimmune thyroiditis: Secondary | ICD-10-CM | POA: Diagnosis not present

## 2017-06-30 DIAGNOSIS — Z9181 History of falling: Secondary | ICD-10-CM | POA: Diagnosis not present

## 2017-06-30 DIAGNOSIS — Z79899 Other long term (current) drug therapy: Secondary | ICD-10-CM | POA: Diagnosis not present

## 2017-06-30 DIAGNOSIS — E785 Hyperlipidemia, unspecified: Secondary | ICD-10-CM | POA: Diagnosis not present

## 2017-06-30 LAB — CUP PACEART REMOTE DEVICE CHECK
Date Time Interrogation Session: 20190522113649
Implantable Lead Implant Date: 20080502
Implantable Lead Location: 753860
Implantable Lead Model: 5076
Implantable Pulse Generator Implant Date: 20161127
MDC IDC LEAD IMPLANT DT: 20080502
MDC IDC LEAD LOCATION: 753859
Pulse Gen Serial Number: 737493

## 2017-09-02 ENCOUNTER — Telehealth: Payer: Self-pay | Admitting: Cardiology

## 2017-09-02 ENCOUNTER — Ambulatory Visit (INDEPENDENT_AMBULATORY_CARE_PROVIDER_SITE_OTHER): Payer: Medicare HMO | Admitting: *Deleted

## 2017-09-02 DIAGNOSIS — I495 Sick sinus syndrome: Secondary | ICD-10-CM

## 2017-09-02 NOTE — Telephone Encounter (Signed)
Spoke with pt and reminded pt of remote transmission that is due today. Pt verbalized understanding.   

## 2017-09-03 NOTE — Progress Notes (Signed)
Remote pacemaker transmission.   

## 2017-10-04 DIAGNOSIS — E063 Autoimmune thyroiditis: Secondary | ICD-10-CM | POA: Diagnosis not present

## 2017-10-04 DIAGNOSIS — E559 Vitamin D deficiency, unspecified: Secondary | ICD-10-CM | POA: Diagnosis not present

## 2017-10-04 DIAGNOSIS — Z79899 Other long term (current) drug therapy: Secondary | ICD-10-CM | POA: Diagnosis not present

## 2017-10-04 DIAGNOSIS — I10 Essential (primary) hypertension: Secondary | ICD-10-CM | POA: Diagnosis not present

## 2017-10-04 DIAGNOSIS — Z298 Encounter for other specified prophylactic measures: Secondary | ICD-10-CM | POA: Diagnosis not present

## 2017-10-04 DIAGNOSIS — R413 Other amnesia: Secondary | ICD-10-CM | POA: Diagnosis not present

## 2017-10-04 DIAGNOSIS — Z6827 Body mass index (BMI) 27.0-27.9, adult: Secondary | ICD-10-CM | POA: Diagnosis not present

## 2017-10-04 DIAGNOSIS — E118 Type 2 diabetes mellitus with unspecified complications: Secondary | ICD-10-CM | POA: Diagnosis not present

## 2017-10-04 DIAGNOSIS — I4891 Unspecified atrial fibrillation: Secondary | ICD-10-CM | POA: Diagnosis not present

## 2017-10-04 DIAGNOSIS — Z1339 Encounter for screening examination for other mental health and behavioral disorders: Secondary | ICD-10-CM | POA: Diagnosis not present

## 2017-10-04 DIAGNOSIS — E785 Hyperlipidemia, unspecified: Secondary | ICD-10-CM | POA: Diagnosis not present

## 2017-10-13 LAB — CUP PACEART REMOTE DEVICE CHECK
Battery Remaining Longevity: 48 mo
Battery Remaining Percentage: 77 %
Brady Statistic RV Percent Paced: 99 %
Date Time Interrogation Session: 20190725080100
Implantable Lead Implant Date: 20080502
Implantable Lead Implant Date: 20080502
Implantable Lead Location: 753860
Implantable Lead Model: 5076
Lead Channel Pacing Threshold Amplitude: 1.1 V
Lead Channel Setting Pacing Amplitude: 2.5 V
Lead Channel Setting Pacing Pulse Width: 0.4 ms
Lead Channel Setting Sensing Sensitivity: 2.5 mV
MDC IDC LEAD LOCATION: 753859
MDC IDC MSMT LEADCHNL RA IMPEDANCE VALUE: 590 Ohm
MDC IDC MSMT LEADCHNL RV IMPEDANCE VALUE: 483 Ohm
MDC IDC MSMT LEADCHNL RV PACING THRESHOLD PULSEWIDTH: 0.4 ms
MDC IDC PG IMPLANT DT: 20161127
MDC IDC PG SERIAL: 737493
MDC IDC SET LEADCHNL RV PACING AMPLITUDE: 3.5 V
MDC IDC STAT BRADY RA PERCENT PACED: 78 %

## 2017-12-02 ENCOUNTER — Encounter: Payer: Medicare HMO | Admitting: *Deleted

## 2017-12-02 ENCOUNTER — Telehealth: Payer: Self-pay

## 2017-12-02 NOTE — Telephone Encounter (Signed)
Spoke with pt and reminded pt of remote transmission that is due today. Pt verbalized understanding.   

## 2017-12-03 ENCOUNTER — Encounter: Payer: Self-pay | Admitting: Cardiology

## 2017-12-14 ENCOUNTER — Ambulatory Visit: Payer: Medicare HMO | Admitting: Internal Medicine

## 2017-12-14 ENCOUNTER — Encounter: Payer: Self-pay | Admitting: Internal Medicine

## 2017-12-14 VITALS — BP 124/64 | HR 70 | Ht 64.0 in | Wt 157.0 lb

## 2017-12-14 DIAGNOSIS — I480 Paroxysmal atrial fibrillation: Secondary | ICD-10-CM

## 2017-12-14 NOTE — Patient Instructions (Signed)
Medication Instructions:  Your physician recommends that you continue on your current medications as directed. Please refer to the Current Medication list given to you today.   Labwork: none  Testing/Procedures: none  Follow-Up: Your physician wants you to follow-up in: 1 year with Dr. Lovena Le.  You will receive a reminder letter in the mail two months in advance. If you don't receive a letter, please call our office to schedule the follow-up appointment.   Any Other Special Instructions Will Be Listed Below (If Applicable).   If you need a refill on your cardiac medications before your next appointment, please call your pharmacy.  Your physician recommends that you continue on your current medications as directed. Please refer to the Current Medication list given to you today.

## 2017-12-14 NOTE — Progress Notes (Signed)
HPI [name] returns today after a 2 year absence from our EP clinic. She is a pleasant elderly woman with a h/o CHB, s/p PPM insertion. She denies chest pain or sob. No syncope. She is using a walker to keep from falling. She denies chest pain or sob.  Allergies  Allergen Reactions  . Lisinopril     cough     Current Outpatient Medications  Medication Sig Dispense Refill  . apixaban (ELIQUIS) 2.5 MG TABS tablet Take 2.5 mg by mouth 2 (two) times daily.    . calcium carbonate (OS-CAL) 600 MG TABS tablet Take 600 mg by mouth daily with breakfast.    . Cyanocobalamin (VITAMIN B-12 CR) 1500 MCG TBCR Take 1,500 mcg by mouth daily.    Marland Kitchen diltiazem (TIAZAC) 180 MG 24 hr capsule Take 180 mg by mouth daily.    Marland Kitchen donepezil (ARICEPT) 10 MG tablet Take 10 mg by mouth daily.  3  . lansoprazole (PREVACID) 30 MG capsule Take 30 mg by mouth daily at 12 noon.    . levETIRAcetam (KEPPRA) 500 MG tablet Take 500 mg by mouth 2 (two) times daily.  3  . levothyroxine (SYNTHROID, LEVOTHROID) 100 MCG tablet Take 100 mcg by mouth daily.  3   No current facility-administered medications for this visit.      Past Medical History:  Diagnosis Date  . Allergic rhinitis due to pollen   . Body mass index 28.0-28.9, adult   . Chronic anticoagulation    stopped 2015 after fall/SAH/SDH  . Diastolic dysfunction   . Dizziness and giddiness   . Encounter for long-term (current) use of other medications   . FHx: mastectomy    for breast cancer  . Gastro - esophageal reflux disease   . Hypertension   . Memory loss   . Other and unspecified hyperlipidemia   . Other B-complex deficiencies   . Other specified acquired hypothyroidism   . Pain in limb   . Persistent atrial fibrillation    on pradaxa  . Sick sinus syndrome [facility])    s/p PPM by Dr Olevia Perches (MDT) remotely, s/p generator change to [facility] 01/06/15 with Dr. Lovena Le    ROS:   All systems reviewed and negative except as noted in the  HPI.   Past Surgical History:  Procedure Laterality Date  . APPENDECTOMY    . EMBOLECTOMY Left 12/30/2012   Procedure: EMBOLECTOMY LEFT LEG, with patch angioplasty;  Surgeon: Serafina Mitchell, MD;  Location: [place];  Service: Vascular;  Laterality: Left;  . EMBOLECTOMY Right 02/05/2014   Procedure: Right Brachial, Axillary, Ulnar and Radial Embolectomy;  Surgeon: Elam Dutch, MD;  Location: [place];  Service: Vascular;  Laterality: Right;  . EP IMPLANTABLE DEVICE N/A 01/06/2015   Procedure: PPM/BIV PPM Generator Changeout;  Surgeon: Evans Lance, MD;  Location: [place] [facility];  Service: Cardiovascular;  Laterality: N/A;  . mastectomy    . PACEMAKER INSERTION     by Dr Olevia Perches for tachy/brady syndrome (MDT)  . TONSILLECTOMY       Family History  Problem Relation Age of Onset  . Heart disease Maternal Grandfather   . Tuberculosis Mother        father  . Tuberculosis Father   . Diabetes Sister   . Diabetes Brother   . Cerebrovascular Accident Brother   . Stroke Brother      Social History   Socioeconomic History  . Marital status: Widowed  Spouse name: Not on file  . Number of children: 2  . Years of education: Not on file  . Highest education level: Not on file  Occupational History  . Occupation: Retired    Fish farm manager: RETIRED  Social Needs  . Financial resource strain: Not on file  . Food insecurity:    Worry: Not on file    Inability: Not on file  . Transportation needs:    Medical: Not on file    Non-medical: Not on file  Tobacco Use  . Smoking status: Never Smoker  . Smokeless tobacco: Never Used  Substance and Sexual Activity  . Alcohol use: No  . Drug use: No  . Sexual activity: Not on file  Lifestyle  . Physical activity:    Days per week: Not on file    Minutes per session: Not on file  . Stress: Not on file  Relationships  . Social connections:    Talks on phone: Not on file    Gets together: Not on file    Attends religious service:  Not on file    Active member of club or organization: Not on file    Attends meetings of clubs or organizations: Not on file    Relationship status: Not on file  . Intimate partner violence:    Fear of current or ex partner: Not on file    Emotionally abused: Not on file    Physically abused: Not on file    Forced sexual activity: Not on file  Other Topics Concern  . Not on file  Social History Narrative   Participates in a senior games.       BP 124/64   Pulse 70   Ht 5\' 4"  (1.626 m)   Wt 157 lb (71.2 kg)   BMI 26.95 kg/m   Physical Exam:  Well appearing elderly woman, NAD HEENT: Unremarkable Neck:  7 cm JVD, no thyromegally Lymphatics:  No adenopathy Back:  No CVA tenderness Lungs:  Clear with no wheezes HEART:  Regular rate rhythm, no murmurs, no rubs, no clicks Abd:  soft, positive bowel sounds, no organomegally, no rebound, no guarding Ext:  2 plus pulses, no edema, no cyanosis, no clubbing Skin:  No rashes no nodules Neuro:  CN II through XII intact, motor grossly intact  EKG - nsr with AV pacing  DEVICE  Normal device function.  See PaceArt for details.   Assess/Plan: 1. CHB - she is asymptomatic, s/p PPM insertion. 2. SInus node dysfunction - she denies symptoms. She is pacing about 80% of the time. 3. PPM - her [employer] DDD PM is working normally with about 6 years of battery longevity.  4. Atrial fib - she is maintaining NSR and she will continue her systemic anti-coagulation with low dose eliquis.  Mikle Bosworth.D.

## 2017-12-15 LAB — CUP PACEART INCLINIC DEVICE CHECK
Brady Statistic RA Percent Paced: 82 %
Brady Statistic RV Percent Paced: 98 %
Implantable Lead Implant Date: 20080502
Implantable Lead Location: 753860
Implantable Lead Model: 5076
Implantable Pulse Generator Implant Date: 20161127
Lead Channel Impedance Value: 573 Ohm
Lead Channel Pacing Threshold Amplitude: 1 V
Lead Channel Pacing Threshold Pulse Width: 0.4 ms
Lead Channel Sensing Intrinsic Amplitude: 25 mV
Lead Channel Setting Pacing Amplitude: 1.5 V
Lead Channel Setting Pacing Amplitude: 2.5 V
Lead Channel Setting Pacing Pulse Width: 0.4 ms
Lead Channel Setting Sensing Sensitivity: 2.5 mV
MDC IDC LEAD IMPLANT DT: 20080502
MDC IDC LEAD LOCATION: 753859
MDC IDC MSMT LEADCHNL RV IMPEDANCE VALUE: 497 Ohm
MDC IDC SESS DTM: 20191105050000
Pulse Gen Serial Number: 737493

## 2018-01-04 DIAGNOSIS — E785 Hyperlipidemia, unspecified: Secondary | ICD-10-CM | POA: Diagnosis not present

## 2018-01-04 DIAGNOSIS — K219 Gastro-esophageal reflux disease without esophagitis: Secondary | ICD-10-CM | POA: Diagnosis not present

## 2018-01-04 DIAGNOSIS — Z6827 Body mass index (BMI) 27.0-27.9, adult: Secondary | ICD-10-CM | POA: Diagnosis not present

## 2018-01-04 DIAGNOSIS — E063 Autoimmune thyroiditis: Secondary | ICD-10-CM | POA: Diagnosis not present

## 2018-01-04 DIAGNOSIS — E118 Type 2 diabetes mellitus with unspecified complications: Secondary | ICD-10-CM | POA: Diagnosis not present

## 2018-01-04 DIAGNOSIS — Z298 Encounter for other specified prophylactic measures: Secondary | ICD-10-CM | POA: Diagnosis not present

## 2018-01-04 DIAGNOSIS — R413 Other amnesia: Secondary | ICD-10-CM | POA: Diagnosis not present

## 2018-01-04 DIAGNOSIS — Z23 Encounter for immunization: Secondary | ICD-10-CM | POA: Diagnosis not present

## 2018-01-04 DIAGNOSIS — I4891 Unspecified atrial fibrillation: Secondary | ICD-10-CM | POA: Diagnosis not present

## 2018-04-08 ENCOUNTER — Encounter: Payer: Self-pay | Admitting: Cardiology

## 2018-04-08 DIAGNOSIS — I4891 Unspecified atrial fibrillation: Secondary | ICD-10-CM | POA: Diagnosis not present

## 2018-04-08 DIAGNOSIS — E785 Hyperlipidemia, unspecified: Secondary | ICD-10-CM | POA: Diagnosis not present

## 2018-04-08 DIAGNOSIS — E063 Autoimmune thyroiditis: Secondary | ICD-10-CM | POA: Diagnosis not present

## 2018-04-08 DIAGNOSIS — E118 Type 2 diabetes mellitus with unspecified complications: Secondary | ICD-10-CM | POA: Diagnosis not present

## 2018-08-05 DIAGNOSIS — E538 Deficiency of other specified B group vitamins: Secondary | ICD-10-CM | POA: Diagnosis not present

## 2018-08-05 DIAGNOSIS — E063 Autoimmune thyroiditis: Secondary | ICD-10-CM | POA: Diagnosis not present

## 2018-08-05 DIAGNOSIS — Z9181 History of falling: Secondary | ICD-10-CM | POA: Diagnosis not present

## 2018-08-05 DIAGNOSIS — I4891 Unspecified atrial fibrillation: Secondary | ICD-10-CM | POA: Diagnosis not present

## 2018-08-05 DIAGNOSIS — E785 Hyperlipidemia, unspecified: Secondary | ICD-10-CM | POA: Diagnosis not present

## 2018-08-05 DIAGNOSIS — Z1331 Encounter for screening for depression: Secondary | ICD-10-CM | POA: Diagnosis not present

## 2018-08-08 DIAGNOSIS — E118 Type 2 diabetes mellitus with unspecified complications: Secondary | ICD-10-CM | POA: Diagnosis not present

## 2018-08-08 DIAGNOSIS — E063 Autoimmune thyroiditis: Secondary | ICD-10-CM | POA: Diagnosis not present

## 2018-08-08 DIAGNOSIS — E785 Hyperlipidemia, unspecified: Secondary | ICD-10-CM | POA: Diagnosis not present

## 2019-03-13 DIAGNOSIS — R1084 Generalized abdominal pain: Secondary | ICD-10-CM | POA: Diagnosis not present

## 2019-03-13 DIAGNOSIS — I10 Essential (primary) hypertension: Secondary | ICD-10-CM | POA: Diagnosis not present

## 2019-03-13 DIAGNOSIS — Z20822 Contact with and (suspected) exposure to covid-19: Secondary | ICD-10-CM | POA: Diagnosis not present

## 2019-03-13 DIAGNOSIS — R519 Headache, unspecified: Secondary | ICD-10-CM | POA: Diagnosis not present

## 2019-03-13 DIAGNOSIS — W19XXXA Unspecified fall, initial encounter: Secondary | ICD-10-CM | POA: Diagnosis not present

## 2019-03-13 DIAGNOSIS — S22080A Wedge compression fracture of T11-T12 vertebra, initial encounter for closed fracture: Secondary | ICD-10-CM | POA: Diagnosis not present

## 2019-03-13 DIAGNOSIS — R52 Pain, unspecified: Secondary | ICD-10-CM | POA: Diagnosis not present

## 2019-03-13 DIAGNOSIS — Z209 Contact with and (suspected) exposure to unspecified communicable disease: Secondary | ICD-10-CM | POA: Diagnosis not present

## 2019-03-14 DIAGNOSIS — Z8781 Personal history of (healed) traumatic fracture: Secondary | ICD-10-CM | POA: Diagnosis not present

## 2019-03-14 DIAGNOSIS — F0280 Dementia in other diseases classified elsewhere without behavioral disturbance: Secondary | ICD-10-CM | POA: Diagnosis not present

## 2019-03-14 DIAGNOSIS — I110 Hypertensive heart disease with heart failure: Secondary | ICD-10-CM | POA: Diagnosis not present

## 2019-03-14 DIAGNOSIS — R5381 Other malaise: Secondary | ICD-10-CM | POA: Diagnosis not present

## 2019-03-14 DIAGNOSIS — I509 Heart failure, unspecified: Secondary | ICD-10-CM | POA: Diagnosis not present

## 2019-03-14 DIAGNOSIS — M545 Low back pain: Secondary | ICD-10-CM | POA: Diagnosis not present

## 2019-03-14 DIAGNOSIS — K219 Gastro-esophageal reflux disease without esophagitis: Secondary | ICD-10-CM | POA: Diagnosis not present

## 2019-03-14 DIAGNOSIS — Z79899 Other long term (current) drug therapy: Secondary | ICD-10-CM | POA: Diagnosis not present

## 2019-03-14 DIAGNOSIS — R519 Headache, unspecified: Secondary | ICD-10-CM | POA: Diagnosis not present

## 2019-03-14 DIAGNOSIS — S0990XA Unspecified injury of head, initial encounter: Secondary | ICD-10-CM | POA: Diagnosis not present

## 2019-03-14 DIAGNOSIS — I10 Essential (primary) hypertension: Secondary | ICD-10-CM | POA: Diagnosis not present

## 2019-03-14 DIAGNOSIS — Z950 Presence of cardiac pacemaker: Secondary | ICD-10-CM | POA: Diagnosis not present

## 2019-03-14 DIAGNOSIS — Z7902 Long term (current) use of antithrombotics/antiplatelets: Secondary | ICD-10-CM | POA: Diagnosis not present

## 2019-03-14 DIAGNOSIS — S299XXA Unspecified injury of thorax, initial encounter: Secondary | ICD-10-CM | POA: Diagnosis not present

## 2019-03-14 DIAGNOSIS — Z20822 Contact with and (suspected) exposure to covid-19: Secondary | ICD-10-CM | POA: Diagnosis not present

## 2019-03-14 DIAGNOSIS — R0781 Pleurodynia: Secondary | ICD-10-CM | POA: Diagnosis not present

## 2019-03-14 DIAGNOSIS — M810 Age-related osteoporosis without current pathological fracture: Secondary | ICD-10-CM | POA: Diagnosis not present

## 2019-03-14 DIAGNOSIS — I4891 Unspecified atrial fibrillation: Secondary | ICD-10-CM | POA: Diagnosis not present

## 2019-03-14 DIAGNOSIS — S199XXA Unspecified injury of neck, initial encounter: Secondary | ICD-10-CM | POA: Diagnosis not present

## 2019-03-14 DIAGNOSIS — G309 Alzheimer's disease, unspecified: Secondary | ICD-10-CM | POA: Diagnosis not present

## 2019-03-14 DIAGNOSIS — E039 Hypothyroidism, unspecified: Secondary | ICD-10-CM | POA: Diagnosis not present

## 2019-03-14 DIAGNOSIS — S22080A Wedge compression fracture of T11-T12 vertebra, initial encounter for closed fracture: Secondary | ICD-10-CM | POA: Diagnosis not present

## 2019-03-15 DIAGNOSIS — F411 Generalized anxiety disorder: Secondary | ICD-10-CM | POA: Diagnosis not present

## 2019-03-15 DIAGNOSIS — I509 Heart failure, unspecified: Secondary | ICD-10-CM | POA: Diagnosis not present

## 2019-03-15 DIAGNOSIS — W19XXXD Unspecified fall, subsequent encounter: Secondary | ICD-10-CM | POA: Diagnosis not present

## 2019-03-15 DIAGNOSIS — K219 Gastro-esophageal reflux disease without esophagitis: Secondary | ICD-10-CM | POA: Diagnosis not present

## 2019-03-15 DIAGNOSIS — I10 Essential (primary) hypertension: Secondary | ICD-10-CM | POA: Diagnosis not present

## 2019-03-15 DIAGNOSIS — F0390 Unspecified dementia without behavioral disturbance: Secondary | ICD-10-CM | POA: Diagnosis not present

## 2019-03-15 DIAGNOSIS — R262 Difficulty in walking, not elsewhere classified: Secondary | ICD-10-CM | POA: Diagnosis not present

## 2019-03-15 DIAGNOSIS — F0280 Dementia in other diseases classified elsewhere without behavioral disturbance: Secondary | ICD-10-CM | POA: Diagnosis not present

## 2019-03-15 DIAGNOSIS — R569 Unspecified convulsions: Secondary | ICD-10-CM | POA: Diagnosis not present

## 2019-03-15 DIAGNOSIS — Z8781 Personal history of (healed) traumatic fracture: Secondary | ICD-10-CM | POA: Diagnosis not present

## 2019-03-15 DIAGNOSIS — E039 Hypothyroidism, unspecified: Secondary | ICD-10-CM | POA: Diagnosis not present

## 2019-03-15 DIAGNOSIS — W19XXXA Unspecified fall, initial encounter: Secondary | ICD-10-CM | POA: Diagnosis not present

## 2019-03-15 DIAGNOSIS — S22080D Wedge compression fracture of T11-T12 vertebra, subsequent encounter for fracture with routine healing: Secondary | ICD-10-CM | POA: Diagnosis not present

## 2019-03-15 DIAGNOSIS — R5381 Other malaise: Secondary | ICD-10-CM | POA: Diagnosis not present

## 2019-03-15 DIAGNOSIS — I4891 Unspecified atrial fibrillation: Secondary | ICD-10-CM | POA: Diagnosis not present

## 2019-03-15 DIAGNOSIS — I110 Hypertensive heart disease with heart failure: Secondary | ICD-10-CM | POA: Diagnosis not present

## 2019-03-15 DIAGNOSIS — R52 Pain, unspecified: Secondary | ICD-10-CM | POA: Diagnosis not present

## 2019-03-15 DIAGNOSIS — R0781 Pleurodynia: Secondary | ICD-10-CM | POA: Diagnosis not present

## 2019-03-15 DIAGNOSIS — G309 Alzheimer's disease, unspecified: Secondary | ICD-10-CM | POA: Diagnosis not present

## 2019-03-15 DIAGNOSIS — M818 Other osteoporosis without current pathological fracture: Secondary | ICD-10-CM | POA: Diagnosis not present

## 2019-03-15 DIAGNOSIS — Z7401 Bed confinement status: Secondary | ICD-10-CM | POA: Diagnosis not present

## 2019-03-15 DIAGNOSIS — M810 Age-related osteoporosis without current pathological fracture: Secondary | ICD-10-CM | POA: Diagnosis not present

## 2019-03-15 DIAGNOSIS — F330 Major depressive disorder, recurrent, mild: Secondary | ICD-10-CM | POA: Diagnosis not present

## 2019-03-17 DIAGNOSIS — F0390 Unspecified dementia without behavioral disturbance: Secondary | ICD-10-CM | POA: Diagnosis not present

## 2019-03-17 DIAGNOSIS — M810 Age-related osteoporosis without current pathological fracture: Secondary | ICD-10-CM | POA: Diagnosis not present

## 2019-03-17 DIAGNOSIS — R262 Difficulty in walking, not elsewhere classified: Secondary | ICD-10-CM | POA: Diagnosis not present

## 2019-03-17 DIAGNOSIS — I4891 Unspecified atrial fibrillation: Secondary | ICD-10-CM | POA: Diagnosis not present

## 2019-04-04 DIAGNOSIS — Z79899 Other long term (current) drug therapy: Secondary | ICD-10-CM | POA: Diagnosis not present

## 2019-04-04 DIAGNOSIS — E559 Vitamin D deficiency, unspecified: Secondary | ICD-10-CM | POA: Diagnosis not present

## 2019-04-04 DIAGNOSIS — E785 Hyperlipidemia, unspecified: Secondary | ICD-10-CM | POA: Diagnosis not present

## 2019-04-04 DIAGNOSIS — E538 Deficiency of other specified B group vitamins: Secondary | ICD-10-CM | POA: Diagnosis not present

## 2019-04-04 DIAGNOSIS — Z139 Encounter for screening, unspecified: Secondary | ICD-10-CM | POA: Diagnosis not present

## 2019-04-04 DIAGNOSIS — E118 Type 2 diabetes mellitus with unspecified complications: Secondary | ICD-10-CM | POA: Diagnosis not present

## 2019-04-04 DIAGNOSIS — E063 Autoimmune thyroiditis: Secondary | ICD-10-CM | POA: Diagnosis not present

## 2019-04-06 DIAGNOSIS — F0390 Unspecified dementia without behavioral disturbance: Secondary | ICD-10-CM | POA: Diagnosis not present

## 2019-04-06 DIAGNOSIS — F411 Generalized anxiety disorder: Secondary | ICD-10-CM | POA: Diagnosis not present

## 2019-04-06 DIAGNOSIS — R2689 Other abnormalities of gait and mobility: Secondary | ICD-10-CM | POA: Diagnosis not present

## 2019-04-06 DIAGNOSIS — E039 Hypothyroidism, unspecified: Secondary | ICD-10-CM | POA: Diagnosis not present

## 2019-04-06 DIAGNOSIS — F330 Major depressive disorder, recurrent, mild: Secondary | ICD-10-CM | POA: Diagnosis not present

## 2019-04-06 DIAGNOSIS — Z79899 Other long term (current) drug therapy: Secondary | ICD-10-CM | POA: Diagnosis not present

## 2019-04-06 DIAGNOSIS — I10 Essential (primary) hypertension: Secondary | ICD-10-CM | POA: Diagnosis not present

## 2019-04-06 DIAGNOSIS — R5381 Other malaise: Secondary | ICD-10-CM | POA: Diagnosis not present

## 2019-04-06 DIAGNOSIS — W19XXXD Unspecified fall, subsequent encounter: Secondary | ICD-10-CM | POA: Diagnosis not present

## 2019-04-06 DIAGNOSIS — Z9181 History of falling: Secondary | ICD-10-CM | POA: Diagnosis not present

## 2019-04-06 DIAGNOSIS — R569 Unspecified convulsions: Secondary | ICD-10-CM | POA: Diagnosis not present

## 2019-04-06 DIAGNOSIS — S22080D Wedge compression fracture of T11-T12 vertebra, subsequent encounter for fracture with routine healing: Secondary | ICD-10-CM | POA: Diagnosis not present

## 2019-04-06 DIAGNOSIS — I480 Paroxysmal atrial fibrillation: Secondary | ICD-10-CM | POA: Diagnosis not present

## 2019-04-06 DIAGNOSIS — M818 Other osteoporosis without current pathological fracture: Secondary | ICD-10-CM | POA: Diagnosis not present

## 2019-04-06 DIAGNOSIS — K219 Gastro-esophageal reflux disease without esophagitis: Secondary | ICD-10-CM | POA: Diagnosis not present

## 2019-04-06 DIAGNOSIS — K5909 Other constipation: Secondary | ICD-10-CM | POA: Diagnosis not present

## 2019-04-06 DIAGNOSIS — Z7902 Long term (current) use of antithrombotics/antiplatelets: Secondary | ICD-10-CM | POA: Diagnosis not present

## 2019-04-12 DIAGNOSIS — M818 Other osteoporosis without current pathological fracture: Secondary | ICD-10-CM | POA: Diagnosis not present

## 2019-04-12 DIAGNOSIS — E039 Hypothyroidism, unspecified: Secondary | ICD-10-CM | POA: Diagnosis not present

## 2019-04-12 DIAGNOSIS — F411 Generalized anxiety disorder: Secondary | ICD-10-CM | POA: Diagnosis not present

## 2019-04-12 DIAGNOSIS — S22080D Wedge compression fracture of T11-T12 vertebra, subsequent encounter for fracture with routine healing: Secondary | ICD-10-CM | POA: Diagnosis not present

## 2019-04-12 DIAGNOSIS — F0390 Unspecified dementia without behavioral disturbance: Secondary | ICD-10-CM | POA: Diagnosis not present

## 2019-04-12 DIAGNOSIS — I480 Paroxysmal atrial fibrillation: Secondary | ICD-10-CM | POA: Diagnosis not present

## 2019-04-17 DIAGNOSIS — M47812 Spondylosis without myelopathy or radiculopathy, cervical region: Secondary | ICD-10-CM | POA: Diagnosis not present

## 2019-04-17 DIAGNOSIS — F0390 Unspecified dementia without behavioral disturbance: Secondary | ICD-10-CM | POA: Diagnosis not present

## 2019-04-17 DIAGNOSIS — R58 Hemorrhage, not elsewhere classified: Secondary | ICD-10-CM | POA: Diagnosis not present

## 2019-04-17 DIAGNOSIS — S0012XA Contusion of left eyelid and periocular area, initial encounter: Secondary | ICD-10-CM | POA: Diagnosis not present

## 2019-04-17 DIAGNOSIS — S299XXA Unspecified injury of thorax, initial encounter: Secondary | ICD-10-CM | POA: Diagnosis not present

## 2019-04-17 DIAGNOSIS — R609 Edema, unspecified: Secondary | ICD-10-CM | POA: Diagnosis not present

## 2019-04-17 DIAGNOSIS — R52 Pain, unspecified: Secondary | ICD-10-CM | POA: Diagnosis not present

## 2019-04-17 DIAGNOSIS — S0990XA Unspecified injury of head, initial encounter: Secondary | ICD-10-CM | POA: Diagnosis not present

## 2019-04-17 DIAGNOSIS — R55 Syncope and collapse: Secondary | ICD-10-CM | POA: Diagnosis not present

## 2019-04-17 DIAGNOSIS — W19XXXA Unspecified fall, initial encounter: Secondary | ICD-10-CM | POA: Diagnosis not present

## 2019-04-17 DIAGNOSIS — I10 Essential (primary) hypertension: Secondary | ICD-10-CM | POA: Diagnosis not present

## 2019-04-21 DIAGNOSIS — M818 Other osteoporosis without current pathological fracture: Secondary | ICD-10-CM | POA: Diagnosis not present

## 2019-04-21 DIAGNOSIS — S22080D Wedge compression fracture of T11-T12 vertebra, subsequent encounter for fracture with routine healing: Secondary | ICD-10-CM | POA: Diagnosis not present

## 2019-04-21 DIAGNOSIS — M533 Sacrococcygeal disorders, not elsewhere classified: Secondary | ICD-10-CM | POA: Diagnosis not present

## 2019-04-21 DIAGNOSIS — F411 Generalized anxiety disorder: Secondary | ICD-10-CM | POA: Diagnosis not present

## 2019-04-21 DIAGNOSIS — F0390 Unspecified dementia without behavioral disturbance: Secondary | ICD-10-CM | POA: Diagnosis not present

## 2019-04-21 DIAGNOSIS — Z043 Encounter for examination and observation following other accident: Secondary | ICD-10-CM | POA: Diagnosis not present

## 2019-04-21 DIAGNOSIS — I480 Paroxysmal atrial fibrillation: Secondary | ICD-10-CM | POA: Diagnosis not present

## 2019-04-21 DIAGNOSIS — E039 Hypothyroidism, unspecified: Secondary | ICD-10-CM | POA: Diagnosis not present

## 2019-04-25 DIAGNOSIS — F411 Generalized anxiety disorder: Secondary | ICD-10-CM | POA: Diagnosis not present

## 2019-04-25 DIAGNOSIS — S22080D Wedge compression fracture of T11-T12 vertebra, subsequent encounter for fracture with routine healing: Secondary | ICD-10-CM | POA: Diagnosis not present

## 2019-04-25 DIAGNOSIS — F0390 Unspecified dementia without behavioral disturbance: Secondary | ICD-10-CM | POA: Diagnosis not present

## 2019-04-25 DIAGNOSIS — M818 Other osteoporosis without current pathological fracture: Secondary | ICD-10-CM | POA: Diagnosis not present

## 2019-04-25 DIAGNOSIS — I480 Paroxysmal atrial fibrillation: Secondary | ICD-10-CM | POA: Diagnosis not present

## 2019-04-25 DIAGNOSIS — E039 Hypothyroidism, unspecified: Secondary | ICD-10-CM | POA: Diagnosis not present

## 2019-05-02 DIAGNOSIS — F411 Generalized anxiety disorder: Secondary | ICD-10-CM | POA: Diagnosis not present

## 2019-05-02 DIAGNOSIS — M818 Other osteoporosis without current pathological fracture: Secondary | ICD-10-CM | POA: Diagnosis not present

## 2019-05-02 DIAGNOSIS — E039 Hypothyroidism, unspecified: Secondary | ICD-10-CM | POA: Diagnosis not present

## 2019-05-02 DIAGNOSIS — S22080D Wedge compression fracture of T11-T12 vertebra, subsequent encounter for fracture with routine healing: Secondary | ICD-10-CM | POA: Diagnosis not present

## 2019-05-02 DIAGNOSIS — F0390 Unspecified dementia without behavioral disturbance: Secondary | ICD-10-CM | POA: Diagnosis not present

## 2019-05-02 DIAGNOSIS — I480 Paroxysmal atrial fibrillation: Secondary | ICD-10-CM | POA: Diagnosis not present

## 2019-05-06 DIAGNOSIS — K219 Gastro-esophageal reflux disease without esophagitis: Secondary | ICD-10-CM | POA: Diagnosis not present

## 2019-05-06 DIAGNOSIS — R5381 Other malaise: Secondary | ICD-10-CM | POA: Diagnosis not present

## 2019-05-06 DIAGNOSIS — I480 Paroxysmal atrial fibrillation: Secondary | ICD-10-CM | POA: Diagnosis not present

## 2019-05-06 DIAGNOSIS — Z7902 Long term (current) use of antithrombotics/antiplatelets: Secondary | ICD-10-CM | POA: Diagnosis not present

## 2019-05-06 DIAGNOSIS — Z79899 Other long term (current) drug therapy: Secondary | ICD-10-CM | POA: Diagnosis not present

## 2019-05-06 DIAGNOSIS — Z9181 History of falling: Secondary | ICD-10-CM | POA: Diagnosis not present

## 2019-05-06 DIAGNOSIS — F0390 Unspecified dementia without behavioral disturbance: Secondary | ICD-10-CM | POA: Diagnosis not present

## 2019-05-06 DIAGNOSIS — F330 Major depressive disorder, recurrent, mild: Secondary | ICD-10-CM | POA: Diagnosis not present

## 2019-05-06 DIAGNOSIS — K5909 Other constipation: Secondary | ICD-10-CM | POA: Diagnosis not present

## 2019-05-06 DIAGNOSIS — M818 Other osteoporosis without current pathological fracture: Secondary | ICD-10-CM | POA: Diagnosis not present

## 2019-05-06 DIAGNOSIS — W19XXXD Unspecified fall, subsequent encounter: Secondary | ICD-10-CM | POA: Diagnosis not present

## 2019-05-06 DIAGNOSIS — F411 Generalized anxiety disorder: Secondary | ICD-10-CM | POA: Diagnosis not present

## 2019-05-06 DIAGNOSIS — R2689 Other abnormalities of gait and mobility: Secondary | ICD-10-CM | POA: Diagnosis not present

## 2019-05-06 DIAGNOSIS — I10 Essential (primary) hypertension: Secondary | ICD-10-CM | POA: Diagnosis not present

## 2019-05-06 DIAGNOSIS — E039 Hypothyroidism, unspecified: Secondary | ICD-10-CM | POA: Diagnosis not present

## 2019-05-06 DIAGNOSIS — S22080D Wedge compression fracture of T11-T12 vertebra, subsequent encounter for fracture with routine healing: Secondary | ICD-10-CM | POA: Diagnosis not present

## 2019-05-06 DIAGNOSIS — R569 Unspecified convulsions: Secondary | ICD-10-CM | POA: Diagnosis not present

## 2019-05-08 DIAGNOSIS — F0390 Unspecified dementia without behavioral disturbance: Secondary | ICD-10-CM | POA: Diagnosis not present

## 2019-05-08 DIAGNOSIS — F411 Generalized anxiety disorder: Secondary | ICD-10-CM | POA: Diagnosis not present

## 2019-05-08 DIAGNOSIS — S22080D Wedge compression fracture of T11-T12 vertebra, subsequent encounter for fracture with routine healing: Secondary | ICD-10-CM | POA: Diagnosis not present

## 2019-05-08 DIAGNOSIS — E039 Hypothyroidism, unspecified: Secondary | ICD-10-CM | POA: Diagnosis not present

## 2019-05-08 DIAGNOSIS — I480 Paroxysmal atrial fibrillation: Secondary | ICD-10-CM | POA: Diagnosis not present

## 2019-05-08 DIAGNOSIS — M818 Other osteoporosis without current pathological fracture: Secondary | ICD-10-CM | POA: Diagnosis not present

## 2019-05-15 DIAGNOSIS — F0390 Unspecified dementia without behavioral disturbance: Secondary | ICD-10-CM | POA: Diagnosis not present

## 2019-05-15 DIAGNOSIS — E039 Hypothyroidism, unspecified: Secondary | ICD-10-CM | POA: Diagnosis not present

## 2019-05-15 DIAGNOSIS — I480 Paroxysmal atrial fibrillation: Secondary | ICD-10-CM | POA: Diagnosis not present

## 2019-05-15 DIAGNOSIS — M818 Other osteoporosis without current pathological fracture: Secondary | ICD-10-CM | POA: Diagnosis not present

## 2019-05-15 DIAGNOSIS — F411 Generalized anxiety disorder: Secondary | ICD-10-CM | POA: Diagnosis not present

## 2019-05-15 DIAGNOSIS — S22080D Wedge compression fracture of T11-T12 vertebra, subsequent encounter for fracture with routine healing: Secondary | ICD-10-CM | POA: Diagnosis not present

## 2019-05-22 DIAGNOSIS — E039 Hypothyroidism, unspecified: Secondary | ICD-10-CM | POA: Diagnosis not present

## 2019-05-22 DIAGNOSIS — S22080D Wedge compression fracture of T11-T12 vertebra, subsequent encounter for fracture with routine healing: Secondary | ICD-10-CM | POA: Diagnosis not present

## 2019-05-22 DIAGNOSIS — M818 Other osteoporosis without current pathological fracture: Secondary | ICD-10-CM | POA: Diagnosis not present

## 2019-05-22 DIAGNOSIS — F411 Generalized anxiety disorder: Secondary | ICD-10-CM | POA: Diagnosis not present

## 2019-05-22 DIAGNOSIS — F0390 Unspecified dementia without behavioral disturbance: Secondary | ICD-10-CM | POA: Diagnosis not present

## 2019-05-22 DIAGNOSIS — I480 Paroxysmal atrial fibrillation: Secondary | ICD-10-CM | POA: Diagnosis not present

## 2019-06-02 DIAGNOSIS — F0390 Unspecified dementia without behavioral disturbance: Secondary | ICD-10-CM | POA: Diagnosis not present

## 2019-06-02 DIAGNOSIS — I480 Paroxysmal atrial fibrillation: Secondary | ICD-10-CM | POA: Diagnosis not present

## 2019-06-02 DIAGNOSIS — F411 Generalized anxiety disorder: Secondary | ICD-10-CM | POA: Diagnosis not present

## 2019-06-02 DIAGNOSIS — E039 Hypothyroidism, unspecified: Secondary | ICD-10-CM | POA: Diagnosis not present

## 2019-06-02 DIAGNOSIS — S22080D Wedge compression fracture of T11-T12 vertebra, subsequent encounter for fracture with routine healing: Secondary | ICD-10-CM | POA: Diagnosis not present

## 2019-06-02 DIAGNOSIS — M818 Other osteoporosis without current pathological fracture: Secondary | ICD-10-CM | POA: Diagnosis not present

## 2019-07-11 DIAGNOSIS — Z950 Presence of cardiac pacemaker: Secondary | ICD-10-CM | POA: Diagnosis not present

## 2019-07-11 DIAGNOSIS — E118 Type 2 diabetes mellitus with unspecified complications: Secondary | ICD-10-CM | POA: Diagnosis not present

## 2019-07-11 DIAGNOSIS — I4891 Unspecified atrial fibrillation: Secondary | ICD-10-CM | POA: Diagnosis not present

## 2019-07-11 DIAGNOSIS — R413 Other amnesia: Secondary | ICD-10-CM | POA: Diagnosis not present

## 2020-03-04 ENCOUNTER — Telehealth: Payer: Self-pay | Admitting: Internal Medicine

## 2020-03-04 NOTE — Telephone Encounter (Signed)
New message:    Patient son calling stating his mother seems to very tired and do not really want to eat. She have not got up today. Please call patient son.

## 2020-03-04 NOTE — Telephone Encounter (Signed)
Returned call to son.  Advised Pt last visit was 2019 with Dr. Lovena Le.  Son states he has a follow up appt with Pt's PCP tomorrow.  Advised son to keep that appointment.  Advised would have scheduler call him tomorrow to set up ASAP appt with first available.  Per son have scheduler leave a VM because he will be at work and may not hear it.

## 2020-03-15 ENCOUNTER — Ambulatory Visit (INDEPENDENT_AMBULATORY_CARE_PROVIDER_SITE_OTHER): Payer: Medicare Other

## 2020-03-15 DIAGNOSIS — I495 Sick sinus syndrome: Secondary | ICD-10-CM

## 2020-03-17 NOTE — Progress Notes (Signed)
Cardiology Office Note Date:  03/17/2020  Patient ID:  [name], [name] [birth year], MRN [id number] PCP:  Ocie Doyne., MD  Cardiologist:  Dr. Lovena Le    Chief Complaint:  over due EP/device visit, lost to follow up  History of Present Illness: [name] is a 87 y.o. female with history of SSSx, CHB s/p PPM with gen change in 2016, HTN, AFib, breast ca treated surgically    She comes in today to be seen for Dr. Lovena Le, last seen by him in Nov 2019, doing OK, no changes were made.  She has previously gone years in between follows as well. This was her last visit to our office.  03/04/20 a phone note from her son with concerns of the patient being tired, not eating well, informed she had not been seen with [place] since 2019 and needed to get follow up with her PMD and planned to re-establish with our office.   TODAY She comes today ambulating with a walker, and accompanied by her son. He mentions that for about a month he and his bother have noted a general slowing by his Mom.  Staying in bed a lot, less energetic, poor appetite. They saw a primary provider (new for her) about 2 weeks ago, did labs, told her K+ was a little low, otherwise OK, recommended encouraging high protein supplements like ensure or boost. He denies any syncope, she has fallen, he says 2-3times in the last 28months and these are reported as being off balance, no fainting.  The patient is hard of hearing but does OK She denies her son's observations, says things are fine and that she eats every evening with them. She denies any CP or palpitations, no cardiac awareness.  Her son agrees, says she has not mentions any physical complaints.  No reports of bleeding or signs of bleeding  Device information: BSci dual chamber PPM, implanted 2008, gen change 01/06/15   Past Medical History:  Diagnosis Date  . Allergic rhinitis due to pollen   . Body mass index 28.0-28.9, adult   . Chronic anticoagulation     stopped 2015 after fall/SAH/SDH  . Diastolic dysfunction   . Dizziness and giddiness   . Encounter for long-term (current) use of other medications   . FHx: mastectomy    for breast cancer  . Gastro - esophageal reflux disease   . Hypertension   . Memory loss   . Other and unspecified hyperlipidemia   . Other B-complex deficiencies   . Other specified acquired hypothyroidism   . Pain in limb   . Persistent atrial fibrillation    on pradaxa  . Sick sinus syndrome [facility])    s/p PPM by Dr Olevia Perches (MDT) remotely, s/p generator change to [facility] 01/06/15 with Dr. Lovena Le    Past Surgical History:  Procedure Laterality Date  . APPENDECTOMY    . EMBOLECTOMY Left 12/30/2012   Procedure: EMBOLECTOMY LEFT LEG, with patch angioplasty;  Surgeon: Serafina Mitchell, MD;  Location: [place];  Service: Vascular;  Laterality: Left;  . EMBOLECTOMY Right 02/05/2014   Procedure: Right Brachial, Axillary, Ulnar and Radial Embolectomy;  Surgeon: Elam Dutch, MD;  Location: [place];  Service: Vascular;  Laterality: Right;  . EP IMPLANTABLE DEVICE N/A 01/06/2015   Procedure: PPM/BIV PPM Generator Changeout;  Surgeon: Evans Lance, MD;  Location: [place] [facility];  Service: Cardiovascular;  Laterality: N/A;  . mastectomy    . PACEMAKER INSERTION  by Dr Olevia Perches for tachy/brady syndrome (MDT)  . TONSILLECTOMY      Current Outpatient Medications  Medication Sig Dispense Refill  . apixaban (ELIQUIS) 2.5 MG TABS tablet Take 2.5 mg by mouth 2 (two) times daily.    . calcium carbonate (OS-CAL) 600 MG TABS tablet Take 600 mg by mouth daily with breakfast.    . Cyanocobalamin (VITAMIN B-12 CR) 1500 MCG TBCR Take 1,500 mcg by mouth daily.    Marland Kitchen diltiazem (TIAZAC) 180 MG 24 hr capsule Take 180 mg by mouth daily.    Marland Kitchen donepezil (ARICEPT) 10 MG tablet Take 10 mg by mouth daily.  3  . lansoprazole (PREVACID) 30 MG capsule Take 30 mg by mouth daily at 12 noon.    . levETIRAcetam (KEPPRA) 500 MG tablet  Take 500 mg by mouth 2 (two) times daily.  3  . levothyroxine (SYNTHROID, LEVOTHROID) 100 MCG tablet Take 100 mcg by mouth daily.  3   No current facility-administered medications for this visit.    Allergies:   Lisinopril   Social History:  The patient  reports that she has never smoked. She has never used smokeless tobacco. She reports that she does not drink alcohol and does not use drugs.   Family History:  The patient's family history includes Cerebrovascular Accident in her brother; Diabetes in her brother and sister; Heart disease in her maternal grandfather; Stroke in her brother; Tuberculosis in her father and mother.  ROS:  Please see the history of present illness.   All other systems are reviewed and otherwise negative.   PHYSICAL EXAM: VS:  There were no vitals taken for this visit. BMI: There is no height or weight on file to calculate BMI. Well nourished, well developed, in no acute distress  HEENT: normocephalic, atraumatic, she has a large growth to R cheek, evidence of recent bleeding Neck: no JVD, carotid bruits or masses Cardiac: RRR; no significant murmurs, no rubs, or gallops Lungs:  CTA b/l, no wheezing, rhonchi or rales  Abd: soft, nontender MS: no deformity, age appropriate atrophy Ext: no edema  Skin: warm and dry, no rash Neuro:  No gross deficits appreciated Psych: euthymic mood, full affect  PPM site is stable, no tethering or discomfort   EKG:  Done today and reviewed by myself  AV paced   PPM interrogation done today and reviewed by myself:  Battery estimate is 1.5years She had an auto switch to unipolar on RV lead 2/2 impedance 2759 Ohms on 02/16/2018 RV impedance unipolar is 408 and thresholds are OK and is programmed on auto threshold A lead measurements are oK She was AV paced at 45 today She had 2 EGMs available Both labeled NSVT One was oversensing on RV lead and resulted in no V pacing with no underlying rhythm 2.2 and 3.4 seconds One  was true NSVT Rubbing her hands together today reproduces noise on RV lead sensitivity on RV lead changed to 5.52mV   01/08/15: TTE Study Conclusions - Left ventricle: The cavity size was normal. Wall thickness was   increased in a pattern of moderate LVH. Systolic function was   normal. The estimated ejection fraction was in the range of 50%   to 55%. Wall motion was normal; there were no regional wall   motion abnormalities. Doppler parameters are consistent with high   ventricular filling pressure. - Ventricular septum: Septal motion showed abnormal function and   dyssynergy. - Mitral valve: There was mild regurgitation. - Left atrium: The atrium was  mildly dilated. - Right atrium: The atrium was mildly dilated. - Pericardium, extracardiac: A small pericardial effusion was   identified. There was a left pleural effusion. Impressions: - Low normal LV function; moderate LVH; elevated LV filling   pressure; mild biatrial enlargement; mild MR; small pericardial   effusion; left pleural effusion.  Recent Labs: No results found for requested labs within last 8760 hours.  No results found for requested labs within last 8760 hours.   CrCl cannot be calculated (Patient's most recent lab result is older than the maximum 21 days allowed.).   Wt Readings from Last 3 Encounters:  12/14/17 157 lb (71.2 kg)  09/03/16 153 lb (69.4 kg)  05/02/15 153 lb 12.8 oz (69.8 kg)     Other studies reviewed: Additional studies/records reviewed today include: summarized above  ASSESSMENT AND PLAN:  1. CHB w/PPM     RV lead fracture, she is dependent and has had single vent of oversensing with inhibition of pacing back in December     Lead sensitivity was adjusted to 5.0 mV  D/w Dr. Lovena Le Plan new RV lead placement and generator change next week.  I discussed the lead findings and recommendation for revision and generator change We discussed the procedure and potential risks/benefits, the  patient and son are agreeable to proceed.  2. Paroxysmal Afib     CHA2DS2Vasc is 4, on Eliquis     under dosed by [facility] labs 03/05/20 Creat 0.84      0 % burden  Hold 2 days prior to implant She has had a couple falls though only 2-3 in 52mo She has evidence of recent bleeding at growth R check, he son says she had slight trauma there a couple nights ago  Given her very advanced age, suspect her PMD has chosen the lowered dose No change for now  3. HTN     No changes today  4. R cheek growth     Deferred to her PMD     Son reports about the same for a year  5. Functional decline     Unclear, suspect her advancing age, it is not 2/2 pacer issue     C/w PMD        Disposition: usual post procedure follow up and pre-op labs  Current medicines are reviewed at length with the patient today.  The patient did not have any concerns regarding medicines.  Haywood Lasso, PA-C 03/17/2020 11:10 AM     [facility] [address] [place] [address] [phone] (office)  [phone] (fax)

## 2020-03-19 ENCOUNTER — Other Ambulatory Visit: Payer: Self-pay

## 2020-03-19 ENCOUNTER — Encounter: Payer: Self-pay | Admitting: *Deleted

## 2020-03-19 ENCOUNTER — Encounter: Payer: Self-pay | Admitting: Physician Assistant

## 2020-03-19 ENCOUNTER — Ambulatory Visit (INDEPENDENT_AMBULATORY_CARE_PROVIDER_SITE_OTHER): Payer: Medicare Other | Admitting: Physician Assistant

## 2020-03-19 VITALS — BP 132/60 | HR 70 | Ht 64.0 in | Wt 149.0 lb

## 2020-03-19 DIAGNOSIS — I10 Essential (primary) hypertension: Secondary | ICD-10-CM | POA: Diagnosis not present

## 2020-03-19 DIAGNOSIS — T82110A Breakdown (mechanical) of cardiac electrode, initial encounter: Secondary | ICD-10-CM | POA: Diagnosis not present

## 2020-03-19 DIAGNOSIS — I480 Paroxysmal atrial fibrillation: Secondary | ICD-10-CM | POA: Diagnosis not present

## 2020-03-19 LAB — CUP PACEART INCLINIC DEVICE CHECK
Date Time Interrogation Session: 20220208173731
Implantable Lead Implant Date: 20080502
Implantable Lead Implant Date: 20080502
Implantable Lead Location: 753859
Implantable Lead Location: 753860
Implantable Lead Model: 5076
Implantable Lead Model: 5076
Implantable Pulse Generator Implant Date: 20161127
Lead Channel Impedance Value: 408 Ohm
Lead Channel Impedance Value: 556 Ohm
Lead Channel Pacing Threshold Amplitude: 0.6 V
Lead Channel Pacing Threshold Amplitude: 1.5 V
Lead Channel Pacing Threshold Pulse Width: 0.4 ms
Lead Channel Pacing Threshold Pulse Width: 0.4 ms
Lead Channel Setting Pacing Amplitude: 2 V
Lead Channel Setting Pacing Amplitude: 2.5 V
Lead Channel Setting Pacing Pulse Width: 0.4 ms
Lead Channel Setting Sensing Sensitivity: 5 mV
Pulse Gen Serial Number: 737493

## 2020-03-19 NOTE — Patient Instructions (Addendum)
Medication Instructions:   Your physician recommends that you continue on your current medications as directed. Please refer to the Current Medication list given to you today.  *If you need a refill on your cardiac medications before your next appointment, please call your pharmacy*   Lab Work:  [place]    If you have labs (blood work) drawn today and your tests are completely normal, you will receive your results only by: Marland Kitchen MyChart Message (if you have MyChart) OR . A paper copy in the mail If you have any lab test that is abnormal or we need to change your treatment, we will call you to review the results.   Testing/Procedures:  SEE LETTER FOR GEN CHANGE (LEAD REVISION)   Follow-Up: At [facility], you and your health needs are our priority.  As part of our continuing mission to provide you with exceptional heart care, we have created designated Provider Care Teams.  These Care Teams include your primary Cardiologist (physician) and Advanced Practice Providers (APPs -  Physician Assistants and Nurse Practitioners) who all work together to provide you with the care you need, when you need it.  We recommend signing up for the patient portal called "MyChart".  Sign up information is provided on this After Visit Summary.  MyChart is used to connect with patients for Virtual Visits (Telemedicine).  Patients are able to view lab/test results, encounter notes, upcoming appointments, etc.  Non-urgent messages can be sent to your provider as well.   To learn more about what you can do with MyChart, go to [email or web address].    Your next appointment:  AFTER  03-25-20   14 DAY WOUND CHECK VISIT  WITH DEVICE TEAM  3 month(s)  The format for your next appointment:   In Person  Provider:   Cristopher Peru, MD  [facility] PACER CHECK    Other Instructions

## 2020-03-19 NOTE — Progress Notes (Signed)
Remote pacemaker transmission.   

## 2020-03-20 ENCOUNTER — Telehealth: Payer: Self-pay

## 2020-03-20 LAB — CBC
Hematocrit: 41 % (ref 34.0–46.6)
Hemoglobin: 13.6 g/dL (ref 11.1–15.9)
MCH: 29.4 pg (ref 26.6–33.0)
MCHC: 33.2 g/dL (ref 31.5–35.7)
MCV: 89 fL (ref 79–97)
Platelets: 150 10*3/uL (ref 150–450)
RBC: 4.63 x10E6/uL (ref 3.77–5.28)
RDW: 14.2 % (ref 11.7–15.4)
WBC: 5.4 10*3/uL (ref 3.4–10.8)

## 2020-03-20 LAB — BASIC METABOLIC PANEL
BUN/Creatinine Ratio: 8 — ABNORMAL LOW (ref 12–28)
BUN: 7 mg/dL — ABNORMAL LOW (ref 10–36)
CO2: 24 mmol/L (ref 20–29)
Calcium: 10.5 mg/dL — ABNORMAL HIGH (ref 8.7–10.3)
Chloride: 108 mmol/L — ABNORMAL HIGH (ref 96–106)
Creatinine, Ser: 0.9 mg/dL (ref 0.57–1.00)
GFR calc Af Amer: 62 mL/min/{1.73_m2} (ref >59)
GFR calc non Af Amer: 54 mL/min/{1.73_m2} — ABNORMAL LOW (ref >59)
Glucose: 111 mg/dL — ABNORMAL HIGH (ref 65–99)
Potassium: 3.5 mmol/L (ref 3.5–5.2)
Sodium: 149 mmol/L — ABNORMAL HIGH (ref 134–144)

## 2020-03-20 NOTE — Telephone Encounter (Signed)
-----   Message from [facility], [place] sent at 03/19/2020  5:34 PM EST ----- Please get he back on remotes.  renee

## 2020-03-20 NOTE — Telephone Encounter (Signed)
I called patient about remote monitoring and she declined remote monitoring at this time.

## 2020-03-22 ENCOUNTER — Other Ambulatory Visit (HOSPITAL_COMMUNITY)
Admission: RE | Admit: 2020-03-22 | Discharge: 2020-03-22 | Disposition: A | Discharge status: Home / Self Care | Payer: Medicare Other | Source: Ambulatory Visit | Attending: Internal Medicine | Admitting: Internal Medicine

## 2020-03-22 DIAGNOSIS — Z01812 Encounter for preprocedural laboratory examination: Secondary | ICD-10-CM | POA: Diagnosis present

## 2020-03-22 DIAGNOSIS — Z20822 Contact with and (suspected) exposure to covid-19: Secondary | ICD-10-CM | POA: Diagnosis not present

## 2020-03-22 LAB — SARS CORONAVIRUS 2 (TAT 6-24 HRS): SARS Coronavirus 2: NEGATIVE

## 2020-03-22 NOTE — Progress Notes (Signed)
Attempted to call patient regarding instructions for Monday's procedure.  NO answer

## 2020-03-25 ENCOUNTER — Encounter (HOSPITAL_COMMUNITY)
Admission: RE | Disposition: A | Discharge status: Home / Self Care | Payer: Self-pay | Source: Home / Self Care | Attending: Internal Medicine

## 2020-03-25 ENCOUNTER — Other Ambulatory Visit: Payer: Self-pay

## 2020-03-25 ENCOUNTER — Ambulatory Visit (HOSPITAL_COMMUNITY): Payer: Medicare Other

## 2020-03-25 ENCOUNTER — Ambulatory Visit (HOSPITAL_COMMUNITY)
Admission: RE | Admit: 2020-03-25 | Discharge: 2020-03-25 | Disposition: A | Discharge status: Home / Self Care | Payer: Medicare Other | Attending: Internal Medicine | Admitting: Internal Medicine

## 2020-03-25 DIAGNOSIS — Y848 Other medical procedures as the cause of abnormal reaction of the patient, or of later complication, without mention of misadventure at the time of the procedure: Secondary | ICD-10-CM | POA: Insufficient documentation

## 2020-03-25 DIAGNOSIS — R5381 Other malaise: Secondary | ICD-10-CM | POA: Diagnosis not present

## 2020-03-25 DIAGNOSIS — T82110A Breakdown (mechanical) of cardiac electrode, initial encounter: Secondary | ICD-10-CM | POA: Diagnosis present

## 2020-03-25 DIAGNOSIS — I442 Atrioventricular block, complete: Secondary | ICD-10-CM | POA: Insufficient documentation

## 2020-03-25 DIAGNOSIS — I480 Paroxysmal atrial fibrillation: Secondary | ICD-10-CM | POA: Diagnosis not present

## 2020-03-25 DIAGNOSIS — I10 Essential (primary) hypertension: Secondary | ICD-10-CM | POA: Diagnosis not present

## 2020-03-25 DIAGNOSIS — Z79890 Hormone replacement therapy (postmenopausal): Secondary | ICD-10-CM | POA: Diagnosis not present

## 2020-03-25 DIAGNOSIS — T82110S Breakdown (mechanical) of cardiac electrode, sequela: Secondary | ICD-10-CM | POA: Diagnosis not present

## 2020-03-25 DIAGNOSIS — Z950 Presence of cardiac pacemaker: Secondary | ICD-10-CM

## 2020-03-25 DIAGNOSIS — Z8249 Family history of ischemic heart disease and other diseases of the circulatory system: Secondary | ICD-10-CM | POA: Insufficient documentation

## 2020-03-25 DIAGNOSIS — Z7901 Long term (current) use of anticoagulants: Secondary | ICD-10-CM | POA: Insufficient documentation

## 2020-03-25 DIAGNOSIS — Z79899 Other long term (current) drug therapy: Secondary | ICD-10-CM | POA: Diagnosis not present

## 2020-03-25 HISTORY — PX: LEAD REVISION/REPAIR: EP1213

## 2020-03-25 HISTORY — PX: PPM GENERATOR CHANGEOUT: EP1233

## 2020-03-25 SURGERY — PPM GENERATOR CHANGEOUT

## 2020-03-25 MED ORDER — SODIUM CHLORIDE 0.9% FLUSH: 3.0000 mL | Freq: Two times a day (BID) | INTRAVENOUS | Status: DC

## 2020-03-25 MED ORDER — HEPARIN (PORCINE) IN NACL 1000-0.9 UT/500ML-% IV SOLN
INTRAVENOUS | Status: AC
  Filled 2020-03-25: qty 500

## 2020-03-25 MED ORDER — ACETAMINOPHEN 325 MG PO TABS: 325.0000 mg | ORAL_TABLET | ORAL | Status: DC | PRN

## 2020-03-25 MED ORDER — MIDAZOLAM HCL 5 MG/5ML IJ SOLN
INTRAMUSCULAR | Status: AC
  Filled 2020-03-25: qty 5

## 2020-03-25 MED ORDER — SODIUM CHLORIDE 0.9 % IV SOLN
INTRAVENOUS | Status: AC
  Filled 2020-03-25: qty 2

## 2020-03-25 MED ORDER — LIDOCAINE HCL (PF) 1 % IJ SOLN
INTRAMUSCULAR | Status: DC | PRN
  Administered 2020-03-25: 60 mL

## 2020-03-25 MED ORDER — SODIUM CHLORIDE 0.9% FLUSH: 3.0000 mL | INTRAVENOUS | Status: DC | PRN

## 2020-03-25 MED ORDER — FENTANYL CITRATE (PF) 100 MCG/2ML IJ SOLN
INTRAMUSCULAR | Status: DC | PRN
  Administered 2020-03-25 (×2): 12.5 ug via INTRAVENOUS

## 2020-03-25 MED ORDER — ONDANSETRON HCL 4 MG/2ML IJ SOLN: 4.0000 mg | Freq: Four times a day (QID) | INTRAMUSCULAR | Status: DC | PRN

## 2020-03-25 MED ORDER — FENTANYL CITRATE (PF) 100 MCG/2ML IJ SOLN
INTRAMUSCULAR | Status: AC
  Filled 2020-03-25: qty 2

## 2020-03-25 MED ORDER — DIPHENHYDRAMINE HCL 50 MG/ML IJ SOLN
INTRAMUSCULAR | Status: AC
  Filled 2020-03-25: qty 1

## 2020-03-25 MED ORDER — LIDOCAINE HCL (PF) 1 % IJ SOLN
INTRAMUSCULAR | Status: AC
  Filled 2020-03-25: qty 60

## 2020-03-25 MED ORDER — CEFAZOLIN SODIUM-DEXTROSE 2-4 GM/100ML-% IV SOLN
INTRAVENOUS | Status: AC
  Filled 2020-03-25: qty 100

## 2020-03-25 MED ORDER — CEFAZOLIN SODIUM-DEXTROSE 2-4 GM/100ML-% IV SOLN
2.0000 g | INTRAVENOUS | Status: AC
  Administered 2020-03-25: 2 g via INTRAVENOUS

## 2020-03-25 MED ORDER — SODIUM CHLORIDE 0.9 % IV SOLN
80.0000 mg | INTRAVENOUS | Status: AC
  Administered 2020-03-25: 80 mg

## 2020-03-25 MED ORDER — HEPARIN (PORCINE) IN NACL 1000-0.9 UT/500ML-% IV SOLN
INTRAVENOUS | Status: DC | PRN
  Administered 2020-03-25: 500 mL

## 2020-03-25 MED ORDER — DIPHENHYDRAMINE HCL 50 MG/ML IJ SOLN
INTRAMUSCULAR | Status: DC | PRN
  Administered 2020-03-25: 25 mg via INTRAVENOUS

## 2020-03-25 MED ORDER — CHLORHEXIDINE GLUCONATE 4 % EX LIQD
4.0000 "application " | Freq: Once | CUTANEOUS | Status: DC
  Filled 2020-03-25: qty 60

## 2020-03-25 MED ORDER — SODIUM CHLORIDE 0.9 % IV SOLN: INTRAVENOUS | Status: DC

## 2020-03-25 SURGICAL SUPPLY — 7 items
CABLE SURGICAL S-101-97-12 (CABLE) ×2 IMPLANT
GUIDEWIRE ANGLED .035X150CM (WIRE) ×1 IMPLANT
LEAD INGEVITY 7841 52 (Lead) ×1 IMPLANT
PACEMAKER ACCOLADE GR (Pacemaker) ×1 IMPLANT
PAD PRO RADIOLUCENT 2001M-C (PAD) ×2 IMPLANT
SHEATH 7FR PRELUDE SNAP 13 (SHEATH) ×1 IMPLANT
TRAY PACEMAKER INSERTION (PACKS) ×2 IMPLANT

## 2020-03-25 NOTE — H&P (Signed)
Cardiology Office Note Date:  03/17/2020  Patient ID:  [name], [name] [birth year], MRN [id number] PCP:  Ocie Doyne., MD        Cardiologist:  Dr. Lovena Le    Chief Complaint:  over due EP/device visit, lost to follow up  History of Present Illness: [name] is a 87 y.o. female with history of SSSx, CHB s/p PPM with gen change in 2016, HTN, AFib, breast ca treated surgically    She comes in today to be seen for Dr. Lovena Le, last seen by him in Nov 2019, doing OK, no changes were made.  She has previously gone years in between follows as well. This was her last visit to our office.  03/04/20 a phone note from her son with concerns of the patient being tired, not eating well, informed she had not been seen with [place] since 2019 and needed to get follow up with her PMD and planned to re-establish with our office.   TODAY She comes today ambulating with a walker, and accompanied by her son. He mentions that for about a month he and his bother have noted a general slowing by his Mom.  Staying in bed a lot, less energetic, poor appetite. They saw a primary provider (new for her) about 2 weeks ago, did labs, told her K+ was a little low, otherwise OK, recommended encouraging high protein supplements like ensure or boost. He denies any syncope, she has fallen, he says 2-3times in the last 31months and these are reported as being off balance, no fainting.  The patient is hard of hearing but does OK She denies her son's observations, says things are fine and that she eats every evening with them. She denies any CP or palpitations, no cardiac awareness.  Her son agrees, says she has not mentions any physical complaints.  No reports of bleeding or signs of bleeding  Device information: BSci dual chamber PPM, implanted 2008, gen change 01/06/15       Past Medical History:  Diagnosis Date  . Allergic rhinitis due to pollen   . Body mass index 28.0-28.9, adult   . Chronic  anticoagulation    stopped 2015 after fall/SAH/SDH  . Diastolic dysfunction   . Dizziness and giddiness   . Encounter for long-term (current) use of other medications   . FHx: mastectomy    for breast cancer  . Gastro - esophageal reflux disease   . Hypertension   . Memory loss   . Other and unspecified hyperlipidemia   . Other B-complex deficiencies   . Other specified acquired hypothyroidism   . Pain in limb   . Persistent atrial fibrillation    on pradaxa  . Sick sinus syndrome [facility])    s/p PPM by Dr Olevia Perches (MDT) remotely, s/p generator change to [facility] [employer] 01/06/15 with Dr. Lovena Le         Past Surgical History:  Procedure Laterality Date  . APPENDECTOMY    . EMBOLECTOMY Left 12/30/2012   Procedure: EMBOLECTOMY LEFT LEG, with patch angioplasty;  Surgeon: Serafina Mitchell, MD;  Location: [place];  Service: Vascular;  Laterality: Left;  . EMBOLECTOMY Right 02/05/2014   Procedure: Right Brachial, Axillary, Ulnar and Radial Embolectomy;  Surgeon: Elam Dutch, MD;  Location: [place];  Service: Vascular;  Laterality: Right;  . EP IMPLANTABLE DEVICE N/A 01/06/2015   Procedure: PPM/BIV PPM Generator Changeout;  Surgeon: Evans Lance, MD;  Location: [place] [facility];  Service: Cardiovascular;  Laterality: N/A;  . mastectomy    . PACEMAKER INSERTION     by Dr Olevia Perches for tachy/brady syndrome (MDT)  . TONSILLECTOMY            Current Outpatient Medications  Medication Sig Dispense Refill  . apixaban (ELIQUIS) 2.5 MG TABS tablet Take 2.5 mg by mouth 2 (two) times daily.    . calcium carbonate (OS-CAL) 600 MG TABS tablet Take 600 mg by mouth daily with breakfast.    . Cyanocobalamin (VITAMIN B-12 CR) 1500 MCG TBCR Take 1,500 mcg by mouth daily.    Marland Kitchen diltiazem (TIAZAC) 180 MG 24 hr capsule Take 180 mg by mouth daily.    Marland Kitchen donepezil (ARICEPT) 10 MG tablet Take 10 mg by mouth daily.  3  . lansoprazole (PREVACID) 30 MG capsule Take 30  mg by mouth daily at 12 noon.    . levETIRAcetam (KEPPRA) 500 MG tablet Take 500 mg by mouth 2 (two) times daily.  3  . levothyroxine (SYNTHROID, LEVOTHROID) 100 MCG tablet Take 100 mcg by mouth daily.  3   No current facility-administered medications for this visit.    Allergies:   Lisinopril   Social History:  The patient  reports that she has never smoked. She has never used smokeless tobacco. She reports that she does not drink alcohol and does not use drugs.   Family History:  The patient's family history includes Cerebrovascular Accident in her brother; Diabetes in her brother and sister; Heart disease in her maternal grandfather; Stroke in her brother; Tuberculosis in her father and mother.  ROS:  Please see the history of present illness.   All other systems are reviewed and otherwise negative.   PHYSICAL EXAM: VS:  There were no vitals taken for this visit. BMI: There is no height or weight on file to calculate BMI. Well nourished, well developed, in no acute distress  HEENT: normocephalic, atraumatic, she has a large growth to R cheek, evidence of recent bleeding Neck: no JVD, carotid bruits or masses Cardiac: RRR; no significant murmurs, no rubs, or gallops Lungs:  CTA b/l, no wheezing, rhonchi or rales  Abd: soft, nontender MS: no deformity, age appropriate atrophy Ext: no edema  Skin: warm and dry, no rash Neuro:  No gross deficits appreciated Psych: euthymic mood, full affect  PPM site is stable, no tethering or discomfort   EKG:  Done today and reviewed by myself  AV paced   PPM interrogation done today and reviewed by myself:  Battery estimate is 1.5years She had an auto switch to unipolar on RV lead 2/2 impedance 2759 Ohms on 02/16/2018 RV impedance unipolar is 408 and thresholds are OK and is programmed on auto threshold A lead measurements are oK She was AV paced at 45 today She had 2 EGMs available Both labeled NSVT One was oversensing on  RV lead and resulted in no V pacing with no underlying rhythm 2.2 and 3.4 seconds One was true NSVT Rubbing her hands together today reproduces noise on RV lead sensitivity on RV lead changed to 5.71mV   01/08/15: TTE Study Conclusions - Left ventricle: The cavity size was normal. Wall thickness was increased in a pattern of moderate LVH. Systolic function was normal. The estimated ejection fraction was in the range of 50% to 55%. Wall motion was normal; there were no regional wall motion abnormalities. Doppler parameters are consistent with high ventricular filling pressure. - Ventricular septum: Septal motion showed abnormal function and dyssynergy. - Mitral valve: There  was mild regurgitation. - Left atrium: The atrium was mildly dilated. - Right atrium: The atrium was mildly dilated. - Pericardium, extracardiac: A small pericardial effusion was identified. There was a left pleural effusion. Impressions: - Low normal LV function; moderate LVH; elevated LV filling pressure; mild biatrial enlargement; mild MR; small pericardial effusion; left pleural effusion.  Recent Labs: No results found for requested labs within last 8760 hours.  No results found for requested labs within last 8760 hours.   CrCl cannot be calculated (Patient's most recent lab result is older than the maximum 21 days allowed.).      Wt Readings from Last 3 Encounters:  12/14/17 157 lb (71.2 kg)  09/03/16 153 lb (69.4 kg)  05/02/15 153 lb 12.8 oz (69.8 kg)     Other studies reviewed: Additional studies/records reviewed today include: summarized above  ASSESSMENT AND PLAN:  1. CHB w/PPM     RV lead fracture, she is dependent and has had single vent of oversensing with inhibition of pacing back in December     Lead sensitivity was adjusted to 5.0 mV  D/w Dr. Lovena Le Plan new RV lead placement and generator change next week.  I discussed the lead findings and recommendation  for revision and generator change We discussed the procedure and potential risks/benefits, the patient and son are agreeable to proceed.  2. Paroxysmal Afib     CHA2DS2Vasc is 4, on Eliquis     under dosed by [facility] labs 03/05/20 Creat 0.84      0 % burden  Hold 2 days prior to implant She has had a couple falls though only 2-3 in 89mo She has evidence of recent bleeding at growth R check, he son says she had slight trauma there a couple nights ago  Given her very advanced age, suspect her PMD has chosen the lowered dose No change for now  3. HTN     No changes today  4. R cheek growth     Deferred to her PMD     Son reports about the same for a year  5. Functional decline     Unclear, suspect her advancing age, it is not 2/2 pacer issue     C/w PMD        Disposition: usual post procedure follow up and pre-op labs  Current medicines are reviewed at length with the patient today.  The patient did not have any concerns regarding medicines.  Haywood Lasso, PA-C 03/17/2020 11:10 AM      EP Attending  Patient seen and examined. Agree with above. The patient has noise and inhibition of her RV pacing lead and is pacemaker dependent. I have reviewed the indications/risks/benefits/goals/expectations of RV lead insertion and PM gen change out and she wishes to proceed.  Carleene Overlie [place],MD

## 2020-03-25 NOTE — Discharge Instructions (Signed)
After Your Lead Revision  . Do not lift your arm above shoulder height for 1 week after your procedure. After 7 days, you may progress as below.  . You should remove your sling 24 hours after your procedure, unless otherwise instructed by your provider.     Monday April 01, 2020  Tuesday April 02, 2020 Wednesday April 03, 2020 Thursday April 04, 2020   . Do not lift, push, pull, or carry anything over 10 pounds with the affected arm until 6 weeks (Monday May 06, 2020) after your procedure.   . Do not drive until your wound check or until instructed by your healthcare provider that you are safe to do so.   . Monitor your surgical site for redness, swelling, and drainage. Call the device clinic at [phone] if you experience these symptoms or fever/chills.  . If your incision is sealed with Steri-strips or staples. You may shower 7 days after your procedure and wash your incision with soap and water as long as it is healed. If your incision is closed with Dermabond/Surgical glue. You may shower 1 day after your pacemaker implant and wash your incision with soap and water. Avoid lotions, ointments, or perfumes over your incision until it is well-healed.  . You may use a hot tub or a pool AFTER your wound check appointment if the incision is completely closed.   . Your cardiac device may be MRI compatible. We will discuss this at your office visit/Wound check  . Remote monitoring is used to monitor your cardiac device from home. This monitoring is scheduled every 91 days by our office. It allows [place] to keep an eye on the functioning of your device to ensure it is working properly. You will routinely see your Electrophysiologist annually (more often if necessary).   Implantable Cardiac Device Lead Replacement, Care After This sheet gives you information about how to care for yourself after your procedure. Your health care provider may also give you more specific instructions. If  you have problems or questions, contact your health care provider. What can I expect after the procedure? After your procedure, it is common to have:  Mild discomfort at the incision site.  A small amount of drainage or bleeding at the incision site. This is usually no more than a spot. Follow these instructions at home: Incision care         Follow instructions from your health care provider about how to take care of your incision. Make sure you: ? Leave stitches (sutures), skin glue, or adhesive strips in place. These skin closures may need to stay in place for 2 weeks or longer. If adhesive strip edges start to loosen and curl up, you may trim the loose edges. Do not remove adhesive strips completely unless your health care provider tells you to do that.  Check your incision area every day for signs of infection. Check for: ? More redness, swelling, or pain. ? More fluid or blood. ? Warmth. ? Pus or a bad smell. Electric and [employer]ar[employer] cell phones should be kept 12 inches (30 cm) away from the cardiac device when they are on. When talking on a cell phone, use the ear on the opposite side of your cardiac device.  Do not place a cell phone in a pocket next to the cardiac device.  Household appliances do not interfere with modern-day cardiac device. Medicines  Take over-the-counter and prescription medicines only as told by your health care provider. General  instructions  Do not raise the arm on the side of your procedure higher than your shoulder for at least 7 days. Except for this restriction, continue to use your arm as normal to prevent problems.  Do not take baths, swim, or use a hot tub until your health care provider says it is okay to do so. You may shower as directed by your health care provider.  Do not lift anything that is heavier than 10 lb (4.5 kg) for 6 weeks or the limit that your health care provider tells you, until he or she says that it is  safe.  Return to your normal activities after 2 weeks, or as told by your health care provider. Ask your health care provider what activities are safe for you.  Keep all follow-up visits as told by your health care provider. This is important. Contact a health care provider if:  You have more redness, swelling, or pain around your incision.  You have more fluid or blood coming from your incision.  Your incision feels warm to the touch.  You have pus or a bad smell coming from your incision.  You have a fever.  The arm or hand on the side of the cardiac device becomes swollen.  The symptoms you had before your procedure are not getting better. Get help right away if:  You develop chest pain.  You feel like you will faint.  You feel light-headed.  You faint. Summary  Check your incision area every day for signs of infection, such as more fluid or blood. A small amount of drainage or bleeding at the incision site is normal.  Do not raise the arm on the side of your procedure higher than your shoulder for at least 5 days, or as long as directed by your health care provider.  Digital cell phones should be kept 12 inches (30 cm) away from the cardiac device when they are on. When talking on a cell phone, use the ear on the opposite side of your cardiac device.  If the symptoms that led to having your lead replaced are not getting better, contact your health care provider. This information is not intended to replace advice given to you by your health care provider. Make sure you discuss any questions you have with your health care provider.

## 2020-03-25 NOTE — Progress Notes (Signed)
Went over D/C instructions with patient and her son. Dr. Lovena Le has already seen patient and stated she could be discharged at 730 pm.

## 2020-03-25 NOTE — H&P (Signed)
Cardiology Office Note Date:  03/17/2020  Patient ID:  Dominique, Adkins [id number], MRN [id number] PCP:  Ocie Doyne., MD        Cardiologist:  Dr. Lovena Le    Chief Complaint:  over due EP/device visit, lost to follow up  History of Present Illness: [name] is a 87 y.o. female with history of SSSx, CHB s/p PPM with gen change in 2016, HTN, AFib, breast ca treated surgically    She comes in today to be seen for Dr. Lovena Le, last seen by him in Nov 2019, doing OK, no changes were made.  She has previously gone years in between follows as well. This was her last visit to our office.  03/04/20 a phone note from her son with concerns of the patient being tired, not eating well, informed she had not been seen with [place] since 2019 and needed to get follow up with her PMD and planned to re-establish with our office.   TODAY She comes today ambulating with a walker, and accompanied by her son. He mentions that for about a month he and his bother have noted a general slowing by his Mom.  Staying in bed a lot, less energetic, poor appetite. They saw a primary provider (new for her) about 2 weeks ago, did labs, told her K+ was a little low, otherwise OK, recommended encouraging high protein supplements like ensure or boost. He denies any syncope, she has fallen, he says 2-3times in the last 9months and these are reported as being off balance, no fainting.  The patient is hard of hearing but does OK She denies her son's observations, says things are fine and that she eats every evening with them. She denies any CP or palpitations, no cardiac awareness.  Her son agrees, says she has not mentions any physical complaints.  No reports of bleeding or signs of bleeding  Device information: [employer] dual chamber PPM, implanted 2008, gen change 01/06/15       Past Medical History:  Diagnosis Date  . Allergic rhinitis due to pollen   . Body mass index 28.0-28.9, adult   . Chronic  anticoagulation    stopped 2015 after fall/SAH/SDH  . Diastolic dysfunction   . Dizziness and giddiness   . Encounter for long-term (current) use of other medications   . FHx: mastectomy    for breast cancer  . Gastro - esophageal reflux disease   . Hypertension   . Memory loss   . Other and unspecified hyperlipidemia   . Other B-complex deficiencies   . Other specified acquired hypothyroidism   . Pain in limb   . Persistent atrial fibrillation    on pradaxa  . Sick sinus syndrome [facility])    s/p PPM by Dr Olevia Perches (MDT) remotely, s/p generator change to [facility] Scientific 01/06/15 with Dr. Lovena Le         Past Surgical History:  Procedure Laterality Date  . APPENDECTOMY    . EMBOLECTOMY Left 12/30/2012   Procedure: EMBOLECTOMY LEFT LEG, with patch angioplasty;  Surgeon: Serafina Mitchell, MD;  Location: [place];  Service: Vascular;  Laterality: Left;  . EMBOLECTOMY Right 02/05/2014   Procedure: Right Brachial, Axillary, Ulnar and Radial Embolectomy;  Surgeon: Elam Dutch, MD;  Location: [place];  Service: Vascular;  Laterality: Right;  . EP IMPLANTABLE DEVICE N/A 01/06/2015   Procedure: PPM/BIV PPM Generator Changeout;  Surgeon: Evans Lance, MD;  Location: [place] CV LAB;  Service: Cardiovascular;  Laterality: N/A;  . mastectomy    . PACEMAKER INSERTION     by Dr Olevia Perches for tachy/brady syndrome (MDT)  . TONSILLECTOMY            Current Outpatient Medications  Medication Sig Dispense Refill  . apixaban (ELIQUIS) 2.5 MG TABS tablet Take 2.5 mg by mouth 2 (two) times daily.    . calcium carbonate (OS-CAL) 600 MG TABS tablet Take 600 mg by mouth daily with breakfast.    . Cyanocobalamin (VITAMIN B-12 CR) 1500 MCG TBCR Take 1,500 mcg by mouth daily.    Marland Kitchen diltiazem (TIAZAC) 180 MG 24 hr capsule Take 180 mg by mouth daily.    Marland Kitchen donepezil (ARICEPT) 10 MG tablet Take 10 mg by mouth daily.  3  . lansoprazole (PREVACID) 30 MG capsule Take 30  mg by mouth daily at 12 noon.    . levETIRAcetam (KEPPRA) 500 MG tablet Take 500 mg by mouth 2 (two) times daily.  3  . levothyroxine (SYNTHROID, LEVOTHROID) 100 MCG tablet Take 100 mcg by mouth daily.  3   No current facility-administered medications for this visit.    Allergies:   Lisinopril   Social History:  The patient  reports that she has never smoked. She has never used smokeless tobacco. She reports that she does not drink alcohol and does not use drugs.   Family History:  The patient's family history includes Cerebrovascular Accident in her brother; Diabetes in her brother and sister; Heart disease in her maternal grandfather; Stroke in her brother; Tuberculosis in her father and mother.  ROS:  Please see the history of present illness.   All other systems are reviewed and otherwise negative.   PHYSICAL EXAM: VS:  There were no vitals taken for this visit. BMI: There is no height or weight on file to calculate BMI. Well nourished, well developed, in no acute distress  HEENT: normocephalic, atraumatic, she has a large growth to R cheek, evidence of recent bleeding Neck: no JVD, carotid bruits or masses Cardiac: RRR; no significant murmurs, no rubs, or gallops Lungs:  CTA b/l, no wheezing, rhonchi or rales  Abd: soft, nontender MS: no deformity, age appropriate atrophy Ext: no edema  Skin: warm and dry, no rash Neuro:  No gross deficits appreciated Psych: euthymic mood, full affect  PPM site is stable, no tethering or discomfort   EKG:  Done today and reviewed by myself  AV paced   PPM interrogation done today and reviewed by myself:  Battery estimate is 1.5years She had an auto switch to unipolar on RV lead 2/2 impedance 2759 Ohms on 02/16/2018 RV impedance unipolar is 408 and thresholds are OK and is programmed on auto threshold A lead measurements are oK She was AV paced at 45 today She had 2 EGMs available Both labeled NSVT One was oversensing on  RV lead and resulted in no V pacing with no underlying rhythm 2.2 and 3.4 seconds One was true NSVT Rubbing her hands together today reproduces noise on RV lead sensitivity on RV lead changed to 5.40mV   01/08/15: TTE Study Conclusions - Left ventricle: The cavity size was normal. Wall thickness was increased in a pattern of moderate LVH. Systolic function was normal. The estimated ejection fraction was in the range of 50% to 55%. Wall motion was normal; there were no regional wall motion abnormalities. Doppler parameters are consistent with high ventricular filling pressure. - Ventricular septum: Septal motion showed abnormal function and dyssynergy. - Mitral valve: There  was mild regurgitation. - Left atrium: The atrium was mildly dilated. - Right atrium: The atrium was mildly dilated. - Pericardium, extracardiac: A small pericardial effusion was identified. There was a left pleural effusion. Impressions: - Low normal LV function; moderate LVH; elevated LV filling pressure; mild biatrial enlargement; mild MR; small pericardial effusion; left pleural effusion.  Recent Labs: No results found for requested labs within last 8760 hours.  No results found for requested labs within last 8760 hours.   CrCl cannot be calculated (Patient's most recent lab result is older than the maximum 21 days allowed.).      Wt Readings from Last 3 Encounters:  12/14/17 157 lb (71.2 kg)  09/03/16 153 lb (69.4 kg)  05/02/15 153 lb 12.8 oz (69.8 kg)     Other studies reviewed: Additional studies/records reviewed today include: summarized above  ASSESSMENT AND PLAN:  1. CHB w/PPM     RV lead fracture, she is dependent and has had single vent of oversensing with inhibition of pacing back in December     Lead sensitivity was adjusted to 5.0 mV  D/w Dr. Lovena Le Plan new RV lead placement and generator change next week.  I discussed the lead findings and recommendation  for revision and generator change We discussed the procedure and potential risks/benefits, the patient and son are agreeable to proceed.  2. Paroxysmal Afib     CHA2DS2Vasc is 4, on Eliquis     under dosed by [facility] labs 03/05/20 Creat 0.84      0 % burden  Hold 2 days prior to implant She has had a couple falls though only 2-3 in 72mo She has evidence of recent bleeding at growth R check, he son says she had slight trauma there a couple nights ago  Given her very advanced age, suspect her PMD has chosen the lowered dose No change for now  3. HTN     No changes today  4. R cheek growth     Deferred to her PMD     Son reports about the same for a year  5. Functional decline     Unclear, suspect her advancing age, it is not 2/2 pacer issue     C/w PMD        Disposition: usual post procedure follow up and pre-op labs  Current medicines are reviewed at length with the patient today.  The patient did not have any concerns regarding medicines.  Haywood Lasso, PA-C 03/17/2020 11:10 AM     EP Attending  Patient seen and examined. Agree with above. The patient has symptomatic pauses due to noise on her RV lead. She is also nearing ERI. She will undergo attempted insertion of a new RV lead and gen change out.  Carleene Overlie Sherria Riemann,MD

## 2020-03-26 ENCOUNTER — Encounter (HOSPITAL_COMMUNITY): Payer: Self-pay | Admitting: Internal Medicine

## 2020-03-26 MED FILL — Midazolam HCl Inj 5 MG/5ML (Base Equivalent): INTRAMUSCULAR | Qty: 5 | Status: AC

## 2020-03-27 ENCOUNTER — Telehealth: Payer: Self-pay

## 2020-03-27 ENCOUNTER — Encounter (HOSPITAL_COMMUNITY): Payer: Self-pay | Admitting: Internal Medicine

## 2020-03-27 NOTE — Telephone Encounter (Signed)
-----   Message from [facility], [place] sent at 03/27/2020 10:17 AM EST ----- [place]  Gen change and new RV lead   Same day d/c from [facility]

## 2020-03-27 NOTE — Telephone Encounter (Signed)
Follow-up after same day discharge: Implant date: 03/25/20 MD: Cristopher Peru, MD Device: [place] dual chamber PPM Location: Left chest   Wound check visit: 04/09/20 90 day MD follow-up: 06/25/20  Remote Transmission received:Yes  Dressing removed: Not yet  Spoke with pt, she had a hard time hearing me, despite speaking loud and slow.  It seems that she has not yet removed the outer dressing.  Pt authorized that I could speak with her son, Annie Main who is EC on file.  Attempted tor each Annie Main.  No answer, left VM requesting he call back DC.

## 2020-03-28 NOTE — Telephone Encounter (Signed)
The patient son Left a voicemail returning nurse phone call. He states he works until 3 Pm and it it best to call after 3:30 pm. His phone number is [phone].

## 2020-03-28 NOTE — Telephone Encounter (Signed)
Spoke with pt [name],  Reviewed D/C instructions including activity restrictions.  He reports pt has not removed outer bandage, encouraged him to remove only the outer bandage leaving steri-strips intacts.  Monitor incision for s/s of infection.

## 2020-04-09 ENCOUNTER — Other Ambulatory Visit: Payer: Self-pay

## 2020-04-09 ENCOUNTER — Ambulatory Visit (INDEPENDENT_AMBULATORY_CARE_PROVIDER_SITE_OTHER): Payer: Medicare Other | Admitting: Emergency Medicine

## 2020-04-09 DIAGNOSIS — I495 Sick sinus syndrome: Secondary | ICD-10-CM | POA: Diagnosis not present

## 2020-04-09 NOTE — Patient Instructions (Signed)
Call the device clinic if you have any swelling, drainage or bleeding from the wound site. Call if you develop a fever or chills. Move your home monitor within 3-5 feet of where you sleep at night.  Device Clinic: [phone]

## 2020-04-17 LAB — CUP PACEART INCLINIC DEVICE CHECK
Battery Remaining Longevity: 90 mo
Brady Statistic RA Percent Paced: 100 %
Brady Statistic RV Percent Paced: 100 %
Date Time Interrogation Session: 20220301152015
Implantable Lead Implant Date: 20080502
Implantable Lead Implant Date: 20220216
Implantable Lead Location: 753859
Implantable Lead Location: 753860
Implantable Lead Model: 5076
Implantable Lead Model: 7841
Implantable Lead Serial Number: 1115870
Implantable Pulse Generator Implant Date: 20220216
Lead Channel Pacing Threshold Amplitude: 0.4 V
Lead Channel Pacing Threshold Amplitude: 0.8 V
Lead Channel Pacing Threshold Pulse Width: 0.4 ms
Lead Channel Pacing Threshold Pulse Width: 0.4 ms
Lead Channel Setting Pacing Amplitude: 1 V
Lead Channel Setting Pacing Amplitude: 2.5 V
Lead Channel Setting Pacing Pulse Width: 0.4 ms
Lead Channel Setting Sensing Sensitivity: 3 mV
Pulse Gen Serial Number: 586959

## 2020-04-17 NOTE — Progress Notes (Signed)
Wound check appointment. Steri-strips removed. Wound without redness or edema. Incision edges approximated, wound well healed. Normal device function. Thresholds, sensing, and impedances consistent with implant measurements. Device programmed at chronic settings in RA due to mature lead and RV  3.5V/auto capture programmed on for extra safety margin until 3 month visit. Histogram distribution appropriate for patient and level of activity. No mode switches or high ventricular rates noted. Patient educated about wound care, arm mobility, lifting restrictions. ROV with Dr Lovena Le 06/25/20. Enrolled in remote follow-up and next remote 06/24/20.

## 2020-05-27 ENCOUNTER — Other Ambulatory Visit: Payer: Self-pay | Admitting: Internal Medicine

## 2020-06-24 ENCOUNTER — Ambulatory Visit (INDEPENDENT_AMBULATORY_CARE_PROVIDER_SITE_OTHER): Payer: Medicare Other

## 2020-06-24 DIAGNOSIS — I495 Sick sinus syndrome: Secondary | ICD-10-CM

## 2020-06-25 ENCOUNTER — Ambulatory Visit (INDEPENDENT_AMBULATORY_CARE_PROVIDER_SITE_OTHER): Payer: Medicare Other | Admitting: Internal Medicine

## 2020-06-25 ENCOUNTER — Other Ambulatory Visit: Payer: Self-pay

## 2020-06-25 ENCOUNTER — Encounter: Payer: Self-pay | Admitting: Internal Medicine

## 2020-06-25 VITALS — BP 158/66 | HR 70 | Ht 64.0 in | Wt 153.8 lb

## 2020-06-25 DIAGNOSIS — I495 Sick sinus syndrome: Secondary | ICD-10-CM

## 2020-06-25 DIAGNOSIS — Z950 Presence of cardiac pacemaker: Secondary | ICD-10-CM | POA: Diagnosis not present

## 2020-06-25 LAB — CUP PACEART REMOTE DEVICE CHECK
Battery Remaining Longevity: 84 mo
Battery Remaining Percentage: 100 %
Brady Statistic RA Percent Paced: 100 %
Brady Statistic RV Percent Paced: 100 %
Date Time Interrogation Session: 20220516010200
Implantable Lead Implant Date: 20080502
Implantable Lead Implant Date: 20220216
Implantable Lead Location: 753859
Implantable Lead Location: 753860
Implantable Lead Model: 5076
Implantable Lead Model: 7841
Implantable Lead Serial Number: 1115870
Implantable Pulse Generator Implant Date: 20220216
Lead Channel Impedance Value: 500 Ohm
Lead Channel Impedance Value: 617 Ohm
Lead Channel Pacing Threshold Amplitude: 0.8 V
Lead Channel Pacing Threshold Pulse Width: 0.4 ms
Lead Channel Setting Pacing Amplitude: 1.3 V
Lead Channel Setting Pacing Amplitude: 2.5 V
Lead Channel Setting Pacing Pulse Width: 0.4 ms
Lead Channel Setting Sensing Sensitivity: 3 mV
Pulse Gen Serial Number: 586959

## 2020-06-25 NOTE — Progress Notes (Signed)
HPI Mrs. Smeal returns today for PPM followup. She is a pleasant elderly woman with a h/o CHB, s/p PPM insertion. She underwent gen change out about 3 months ago. She denies chest pain or sob. No syncope. She is using a walker to keep from falling. She denies pain at her insertion site.  Allergies  Allergen Reactions  . Lisinopril     cough     Current Outpatient Medications  Medication Sig Dispense Refill  . apixaban (ELIQUIS) 2.5 MG TABS tablet Take 2.5 mg by mouth 2 (two) times daily.    . calcium carbonate (OS-CAL) 600 MG TABS tablet Take 600 mg by mouth daily with breakfast.    . Cyanocobalamin (VITAMIN B-12 CR) 1500 MCG TBCR Take 1,500 mcg by mouth daily.    Marland Kitchen diltiazem (TIAZAC) 180 MG 24 hr capsule Take 180 mg by mouth daily.    Marland Kitchen donepezil (ARICEPT) 10 MG tablet Take 10 mg by mouth daily.  3  . lansoprazole (PREVACID) 30 MG capsule Take 30 mg by mouth daily at 12 noon.    . levETIRAcetam (KEPPRA) 500 MG tablet Take 500 mg by mouth 2 (two) times daily.  3  . levothyroxine (SYNTHROID, LEVOTHROID) 100 MCG tablet Take 100 mcg by mouth daily.  3   No current facility-administered medications for this visit.     Past Medical History:  Diagnosis Date  . Allergic rhinitis due to pollen   . Body mass index 28.0-28.9, adult   . Chronic anticoagulation    stopped 2015 after fall/SAH/SDH  . Diastolic dysfunction   . Dizziness and giddiness   . Encounter for long-term (current) use of other medications   . FHx: mastectomy    for breast cancer  . Gastro - esophageal reflux disease   . Hypertension   . Memory loss   . Other and unspecified hyperlipidemia   . Other B-complex deficiencies   . Other specified acquired hypothyroidism   . Pain in limb   . Persistent atrial fibrillation ([place])    on pradaxa  . Sick sinus syndrome [facility])    s/p PPM by Dr Olevia Perches (MDT) remotely, s/p generator change to [facility] Scientific 01/06/15 with Dr. Lovena Le    ROS:   All systems reviewed  and negative except as noted in the HPI.   Past Surgical History:  Procedure Laterality Date  . APPENDECTOMY    . EMBOLECTOMY Left 12/30/2012   Procedure: EMBOLECTOMY LEFT LEG, with patch angioplasty;  Surgeon: Serafina Mitchell, MD;  Location: [place];  Service: Vascular;  Laterality: Left;  . EMBOLECTOMY Right 02/05/2014   Procedure: Right Brachial, Axillary, Ulnar and Radial Embolectomy;  Surgeon: Elam Dutch, MD;  Location: [place];  Service: Vascular;  Laterality: Right;  . EP IMPLANTABLE DEVICE N/A 01/06/2015   Procedure: PPM/BIV PPM Generator Changeout;  Surgeon: Evans Lance, MD;  Location: [place] CV LAB;  Service: Cardiovascular;  Laterality: N/A;  . LEAD REVISION/REPAIR N/A 03/25/2020   Procedure: LEAD REVISION/REPAIR;  Surgeon: Evans Lance, MD;  Location: [place] CV LAB;  Service: Cardiovascular;  Laterality: N/A;  . mastectomy    . PACEMAKER INSERTION     by Dr Olevia Perches for tachy/brady syndrome (MDT)  . PPM GENERATOR CHANGEOUT N/A 03/25/2020   Procedure: PPM GENERATOR CHANGEOUT;  Surgeon: Evans Lance, MD;  Location: [place] CV LAB;  Service: Cardiovascular;  Laterality: N/A;  . TONSILLECTOMY       Family History  Problem Relation Age  of Onset  . Heart disease Maternal Grandfather   . Tuberculosis Mother        father  . Tuberculosis Father   . Diabetes Sister   . Diabetes Brother   . Cerebrovascular Accident Brother   . Stroke Brother      Social History   Socioeconomic History  . Marital status: Widowed    Spouse name: Not on file  . Number of children: 2  . Years of education: Not on file  . Highest education level: Not on file  Occupational History  . Occupation: Retired    Fish farm manager: RETIRED  Tobacco Use  . Smoking status: Never Smoker  . Smokeless tobacco: Never Used  Vaping Use  . Vaping Use: Never used  Substance and Sexual Activity  . Alcohol use: No  . Drug use: No  . Sexual activity: Not on file  Other Topics Concern  .  Not on file  Social History Narrative   Participates in a senior games.     Social Determinants of Health   Financial Resource Strain: Not on file  Food Insecurity: Not on file  Transportation Needs: Not on file  Physical Activity: Not on file  Stress: Not on file  Social Connections: Not on file  Intimate Partner Violence: Not on file     BP (!) 158/66   Pulse 70   Ht 5\' 4"  (1.626 m)   Wt 153 lb 12.8 oz (69.8 kg)   SpO2 98%   BMI 26.40 kg/m   Physical Exam:  Well appearing NAD HEENT: Unremarkable Neck:  No JVD, no thyromegally Lymphatics:  No adenopathy Back:  No CVA tenderness Lungs:  Clear with no wheezes HEART:  Regular rate rhythm, no murmurs, no rubs, no clicks Abd:  soft, positive bowel sounds, no organomegally, no rebound, no guarding Ext:  2 plus pulses, no edema, no cyanosis, no clubbing Skin:  No rashes no nodules Neuro:  CN II through XII intact, motor grossly intact   DEVICE  Normal device function.  See PaceArt for details.   Assess/Plan: 1. CHB - she is asymptomatic s/p PPM insertion. 2. PPM - her [employer] DDD PM is working normally. We will recheck in several months. 3. Sinus node dysfunction - she is pacing 99% of the time.  Carleene Overlie Romelle Muldoon,MD

## 2020-06-25 NOTE — Patient Instructions (Signed)
Medication Instructions:  Your physician recommends that you continue on your current medications as directed. Please refer to the Current Medication list given to you today.  Labwork: None ordered.  Testing/Procedures: None ordered.  Follow-Up: Your physician wants you to follow-up in: one year with Gregg Taylor, MD or one of the following Advanced Practice Providers on your designated Care Team:    Amber Seiler, NP  Renee Ursuy, PA-C  Michael "Andy" Tillery, PA-C  Remote monitoring is used to monitor your Pacemaker from home. This monitoring reduces the number of office visits required to check your device to one time per year. It allows us to keep an eye on the functioning of your device to ensure it is working properly. You are scheduled for a device check from home on 09/23/2020. You may send your transmission at any time that day. If you have a wireless device, the transmission will be sent automatically. After your physician reviews your transmission, you will receive a postcard with your next transmission date.  Any Other Special Instructions Will Be Listed Below (If Applicable).  If you need a refill on your cardiac medications before your next appointment, please call your pharmacy.       

## 2020-06-26 DIAGNOSIS — C44300 Unspecified malignant neoplasm of skin of unspecified part of face: Secondary | ICD-10-CM | POA: Insufficient documentation

## 2020-06-27 ENCOUNTER — Telehealth: Payer: Self-pay

## 2020-06-27 NOTE — Telephone Encounter (Signed)
Carelink alert received for atrial threshold greater than programmed. "On 06/25/2020 atrial threshold in clinic was 0.7 volts and amplitude is programmed to 2.5 volts.  No atrial threshold recorded from today."  Spoke with patient who lives alone. No family/friend on DPR. Patient able to send manual transmission however no measurement on amplitude or threshold. Pace impedance consistent with previous readings. Patient A/V pacing 100%. Denies cardiac complaints. Will continue to monitor and see if additional alerts received tomorrow.

## 2020-07-17 NOTE — Progress Notes (Signed)
Remote pacemaker transmission.   

## 2021-01-23 ENCOUNTER — Telehealth: Payer: Self-pay | Admitting: *Deleted

## 2021-01-23 NOTE — Telephone Encounter (Signed)
Will route to pharm for input on Eliquis then pt will need call.  Last OV 06/2020 with Dr. Lovena Le. Afib not specifically reported on at that time. Went back to Renee's note 03/2020 for details on Eliquis since we do not prescribe but does look like it's for cardiac reasons. Per her note:    "Paroxysmal Afib     CHA2DS2Vasc is 4, on Eliquis     under dosed by [facility] labs 03/05/20 Creat 0.84      0 % burden     Given her very advanced age, suspect her PMD has chosen the lowered dose          No change for now"

## 2021-01-23 NOTE — Telephone Encounter (Signed)
° °  Name: [name]  DOB: Nov 27, 1921  MRN: [id number]   Primary Cardiologist: Dr. Lovena Le  Chart reviewed as part of pre-operative protocol coverage. Patient was contacted 01/23/2021 in reference to pre-operative risk assessment for pending surgery as outlined below.  Patient herself answered, was alert and oriented, and states she is not having any type of procedures in the upcoming days or weeks. She denied that she is having any sort of lymph node surgery or procedure as listed. I asked her if there is a family member I should call to discuss her care with and she said no. (No DPR on file either.) I will reach out to our callback team to call surgeon's office to ensure this is the right patient/procedure and to also clarify type of anesthesia. Once known please route back to preop pool.  Charlie Pitter, PA-C 01/23/2021, 5:20 PM

## 2021-01-23 NOTE — Telephone Encounter (Signed)
Patient with diagnosis of afib on Eliquis for anticoagulation.    Procedure: ULTRASOUND GUIDANCE FOR ASPIRATION OF LYMPH NODE   Date of procedure: 01/28/21   CHA2DS2-VASc Score = 4   This indicates a 4.8% annual risk of stroke. The patient's score is based upon: CHF History: 0 HTN History: 1 Diabetes History: 0 Stroke History: 0 Vascular Disease History: 0 Age Score: 2 Gender Score: 1     CrCl 34 ml/min  Per office protocol, patient can hold Eliquis for 2 days prior to procedure.

## 2021-01-23 NOTE — Telephone Encounter (Signed)
° °  Pre-operative Risk Assessment    Patient Name: [name]  DOB: 05-24-1921 MRN: [id number]      Request for Surgical Clearance    Procedure:   ULTRASOUND GUIDANCE FOR ASPIRATION OF LYMPH NODE  Date of Surgery:  Clearance 01/28/21                                 Surgeon:  NOT LISTED Surgeon's Group or Practice Name:  [facility] Phone number:  [phone] Fax number:  [phone]   Type of Clearance Requested:   - Medical  - Pharmacy:  Hold Apixaban (Eliquis) x 4 DOSES    Type of Anesthesia:  Not Indicated   Additional requests/questions:    Jiles Prows   01/23/2021, 11:55 AM

## 2021-01-24 NOTE — Telephone Encounter (Signed)
° °  Name: [name]  DOB: [birth year]  MRN: [id number]   Primary Cardiologist: None  Chart reviewed as part of pre-operative protocol coverage. Patient was contacted 01/24/2021 in reference to pre-operative risk assessment for pending surgery as outlined below.  ANAMARIA DUSENBURY was last seen on 06/25/2020 by Dr. Lovena Le.  Since that day, TEMPRANCE WYRE has done well without exertional chest pain or worsening dyspnea. I spoke with the patient's son, overall, she is quite independent. Given the low risk nature of the procedure, she should be cleared.   Therefore, based on ACC/AHA guidelines, the patient would be at acceptable risk for the planned procedure without further cardiovascular testing.   Per recommendation by our clinical pharmacist, she will need to hold Eliquis for 2 days prior to the procedure and restart as soon as possible afterward at the surgeon's discretion.  The patient was advised that if she develops new symptoms prior to surgery to contact our office to arrange for a follow-up visit, and she verbalized understanding.  I will route this recommendation to the requesting party via Epic fax function and remove from pre-op pool. Please call with questions.  [place], [place] 01/24/2021, 12:19 PM

## 2021-01-24 NOTE — Telephone Encounter (Signed)
Per Melina Copa, PAC pre op provider yesterday, I did confirm with the requesting office that we have the correct pt. Per Melina Copa, PAC no DPR on file, see notes from [facility] yesterday. I will update the pre op provider for today, Almyra Deforest, PAC. ENT office said they s/w the pt's son.

## 2021-02-20 ENCOUNTER — Telehealth: Payer: Self-pay | Admitting: Oncology

## 2021-02-20 NOTE — Telephone Encounter (Signed)
Scheduled appt per 1/9 referral. Pt's son is aware of appt date and time. He is aware to have pt arrive 15 mins early and to bring updated insurance card.

## 2021-02-27 NOTE — Progress Notes (Signed)
[facility]  [address] [place],  [place]  [address] [phone]  Clinic Day:  03/06/2021  Referring physician: Ocie Doyne., MD  This document serves as a record of services personally performed by Hosie Poisson, MD. It was created on their behalf by [facility], a trained medical scribe. The creation of this record is based on the scribe's personal observations and the provider's statements to them.  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 We will request the official pathology report.  Malignant appearing mass of the right submandibular area which measures about 7-8 x 6 cm. FNA was suspicious of squamous proliferative process, including carcinoma. This appears to be enlarging fairly quickly and is painful. We discussed treatment options today including immunotherapy +/- radiation therapy. I do not recommend pursuing aggressive chemotherapy, but another option would be cetuximab.  Hypercalcemia, mild. She has discontinued oral calcium supplement.  Dementia, for which she is on medication. She is mildly confused, but is able to live at home and function by herself. She has limited comprehension and memory of this malignant process. I have answered her questions honestly, but I don't know how much she remembers.  Atrial fibrillation and pacemaker, for which she is on Eliquis. This would have to be held for port placement.  Pain of the right jaw secondary to tumor. I advised that they avoid ibuprofen and Advil. She has tramadol 50 mg to use as needed, up to 4 daily, and for breakthrough pain, I advised that they use Tylenol.    This is a pleasant 88 year old female with a history of metastatic squamous cell carcinoma. She now has a suspicious mass of the right submandibular area, which seems consistent with metastatic disease. PET imaging will be obtained to rule  out other evidence of metastasis. In view of her advanced age and comorbidities, I would not recommend chemotherapy, however, immunotherapy may be considered with LIBTAYO. She will require port placement, which can scheduled through interventional radiology. Radiation therapy has been recommended, and so we will refer her to Dr. Orlene Erm for further evaluation and discussion. I will plan to present her case at [facility] for multidisciplinary discussion. Once we receive the results, we will bring her back to finalize a treatment plan. She and her son understand and agree to this plan of care. I have answered their questions.  Thank you for the opportunity to participate in the care of your patients  I provided 50 minutes of face-to-face time during this this encounter and > 50% was spent counseling as documented under my assessment and plan.    Derwood Kaplan, MD [facility] [address] [place] [address] Dept: [phone] Dept Fax: [phone]    CHIEF COMPLAINT:  CC: Metastatic squamous cell carcinoma  Current Treatment:  Diagnostics   HISTORY OF PRESENT ILLNESS:  [name] is a 88 y.o. female referred by Dr. Cletis Athens for the evaluation and treatment of metastatic squamous cell carcinoma. This began when the patient developed a progressively enlarging and painful mass of the right submandibular a few months ago. CT neck from December revealed a 27 x 25 mm cyst lateral to the right submandibular gland. The right submandibular gland otherwise is normal. Mild amount of soft tissue nodularity within the cyst. There was also an 11 mm necrotic right level 3 lymph node.  FNA was obtained on December 20th and cytopathology confirmed rare atypical cells and keratin. Findings are suspicious of squamous proliferative process, including carcinoma. Of note, the patient had a history of squamous cell carcinoma with excision  of a right facial lesions in May 2022 by dermatology. She was evaluated by Dr. Gaylyn Cheers who has recommended chemotherapy and radiation.  She has a medical history significant for atrial fibrillation, congestive heart failure and a pacemaker, and is on anticoagulation with Eliquis. She also has a history of thrombosis, and has one function kidney. She has also required esophageal dilation for stricture in the past. She has hypertension, dementia, and thyroid disease.  INTERVAL HISTORY:  I have reviewed her chart and materials related to her cancer extensively and collaborated history with the patient. Summary of oncologic history is as follows: Oncology History   No history exists.    [name] does have dementia but lives at home alone with her sons nearby. She is able to cook for herself and is on medication for her dementia. Her son accompanies her today to assist in the patient history. This mass of the neck is painful on palpation, and she uses tramadol 50 mg as needed with mild relief. The son has also been giving her ibuprofen and I advised that he stop that due to her anticoagulation. The patient asked if this is cancer and I told her it was, but I think she has limited comprehension and memory retention. Blood counts and chemistries are unremarkable except for a creatinine of 1.1, stable, and a calcium of 10.6. She remains off oral calcium supplement. Her  appetite is good, and she is eating well.  She denies fever, chills or other signs of infection.  She denies nausea, vomiting, bowel issues, or abdominal pain.  She denies sore throat, cough, dyspnea, or chest pain.  HISTORY:   Past Medical History:  Diagnosis Date   Allergic rhinitis due to pollen    Body mass index 28.0-28.9, adult    Chronic anticoagulation    stopped 2015 after fall/SAH/SDH   Dementia (HCC)    Diastolic dysfunction    Dizziness and giddiness    Encounter for long-term (current) use of other medications    FHx:  mastectomy    for breast cancer   Gastro - esophageal reflux disease    Hypertension    Memory loss    Other and unspecified hyperlipidemia    Other B-complex deficiencies    Other specified acquired hypothyroidism    Pain in limb    Persistent atrial fibrillation (HCC)    on pradaxa   Sick sinus syndrome ([place])    s/p PPM by Dr Olevia Perches (MDT) remotely, s/p generator change to [facility] [employer] 01/06/15 with Dr. Lovena Le   Past Surgical History:  Procedure Laterality Date   APPENDECTOMY     EMBOLECTOMY Left 12/30/2012   Procedure: EMBOLECTOMY LEFT LEG, with patch angioplasty;  Surgeon: Serafina Mitchell, MD;  Location: [facility];  Service: Vascular;  Laterality: Left;   EMBOLECTOMY Right 02/05/2014   Procedure: Right Brachial, Axillary, Ulnar and Radial Embolectomy;  Surgeon: Elam Dutch, MD;  Location: [facility];  Service: Vascular;  Laterality: Right;   EP IMPLANTABLE DEVICE N/A 01/06/2015   Procedure: PPM/BIV PPM Generator Changeout;  Surgeon: Evans Lance, MD;  Location: [facility];  Service: Cardiovascular;  Laterality: N/A;   LEAD REVISION/REPAIR N/A 03/25/2020   Procedure: LEAD REVISION/REPAIR;  Surgeon: Evans Lance, MD;  Location: [facility];  Service: Cardiovascular;  Laterality: N/A;   mastectomy     PACEMAKER INSERTION     by Dr Olevia Perches for tachy/brady syndrome (MDT)   PPM GENERATOR CHANGEOUT N/A 03/25/2020   Procedure: PPM GENERATOR CHANGEOUT;  Surgeon: Evans Lance, MD;  Location: [facility];  Service: Cardiovascular;  Laterality: N/A;   TONSILLECTOMY      Family History  Problem Relation Age of Onset   Tuberculosis Mother        father   Tuberculosis Father    Diabetes Sister    Diabetes Brother    Cerebrovascular Accident Brother    Stroke Brother    Heart disease Maternal Grandfather    Throat cancer Son    Prostate cancer Son     Social History:  reports that she has never smoked. She has never used smokeless tobacco. She reports  that she does not drink alcohol and does not use drugs.The patient is accompanied by her son today. She is widowed and lives at home alone, but her sons lives nearby. She has 2 sons. She is retired, and has never been exposed to chemicals or other toxic agents.   Allergies:  Allergies  Allergen Reactions   Lisinopril     cough    Current Medications: Current Outpatient Medications  Medication Sig Dispense Refill   apixaban (ELIQUIS) 2.5 MG TABS tablet Take 2.5 mg by mouth 2 (two) times daily.     calcium carbonate (OS-CAL) 600 MG TABS tablet Take 600 mg by mouth daily with breakfast.     Cyanocobalamin (VITAMIN B-12 CR) 1500 MCG TBCR Take 1,500 mcg by mouth daily.     diltiazem (TIAZAC) 180 MG 24 hr capsule Take 180 mg by mouth daily.     donepezil (ARICEPT) 10 MG tablet Take 10 mg by mouth daily.  3   lansoprazole (PREVACID) 30 MG capsule Take 30 mg by mouth daily at 12 noon.     levETIRAcetam (KEPPRA) 500 MG tablet Take 500 mg by mouth 2 (two) times daily.  3   levothyroxine (SYNTHROID, LEVOTHROID) 100 MCG tablet Take 100 mcg by mouth daily.  3   No current facility-administered medications for this visit.    REVIEW OF SYSTEMS:  Review of Systems  Constitutional: Negative.  Negative for appetite change, chills, fatigue, fever and unexpected weight change.  HENT:          Pain of the jaw  Eyes: Negative.   Respiratory: Negative.  Negative for chest tightness, cough, hemoptysis, shortness of breath and wheezing.   Cardiovascular: Negative.  Negative for chest pain, leg swelling and palpitations.  Gastrointestinal: Negative.  Negative for abdominal distention, abdominal pain, blood in stool, constipation, diarrhea, nausea and vomiting.  Endocrine: Negative.   Genitourinary: Negative.  Negative for difficulty urinating, dysuria, frequency and hematuria.   Musculoskeletal: Negative.  Negative for arthralgias, back pain, flank pain, gait problem and myalgias.  Skin: Negative.    Neurological: Negative.  Negative for dizziness, extremity weakness, gait problem, headaches, light-headedness, numbness, seizures and speech difficulty.  Hematological: Negative.   Psychiatric/Behavioral:  Positive for confusion (mild, and impaired memory). Negative for depression and sleep disturbance. The patient is not nervous/anxious.     VITALS:  Blood pressure (!) 151/70, pulse 74, temperature 97.7 F (36.5 C), temperature source Oral, resp. rate 20, height 5\' 4"  (1.626 m), weight 162 lb 11.2 oz (73.8 kg), SpO2 95 %.  Wt Readings from Last 3 Encounters:  03/06/21 162 lb 11.2 oz (73.8 kg)  06/25/20 153 lb 12.8 oz (69.8 kg)  03/25/20 149 lb 0.5 oz (67.6 kg)    Body mass index is 27.93 kg/m.  Performance status (ECOG): 1 - Symptomatic but completely ambulatory  PHYSICAL EXAM:  Physical Exam Constitutional:      General: She is not in acute distress.    Appearance: Normal appearance. She is normal weight.  HENT:     Head: Normocephalic and atraumatic.     Comments: She has a deep scar in the right maxillary area which is well healed. She has a number of moles and keratotic lesions.   Eyes:     General: No scleral icterus.    Extraocular Movements: Extraocular movements intact.     Conjunctiva/sclera: Conjunctivae normal.     Pupils: Pupils are equal, round, and reactive to light.  Neck:     Comments: Mass measuring about 7-8 cm in length and 6 cm across, which is hard and tender, and extends from the right anterior neck to the right submandibular. No nodules are seen. Cardiovascular:     Rate and Rhythm: Normal rate and regular rhythm.     Pulses: Normal pulses.     Heart sounds: Normal heart sounds. No murmur heard.   No friction rub. No gallop.  Pulmonary:     Effort: Pulmonary effort is normal. No respiratory distress.     Breath sounds: Normal breath sounds.  Abdominal:     General: Bowel sounds are normal. There is no distension.     Palpations: Abdomen is soft.  There is no hepatomegaly, splenomegaly or mass.     Tenderness: There is no abdominal tenderness.  Musculoskeletal:        General: Normal range of motion.     Cervical back: Normal range of motion and neck supple.     Right lower leg: No edema.     Left lower leg: No edema.  Lymphadenopathy:     Cervical: No cervical adenopathy.  Skin:    General: Skin is warm and dry.     Comments: Skin is paper thin. Skin turgor is decreased.  Neurological:     General: No focal deficit present.     Mental Status: She is alert.  Psychiatric:        Mood and Affect: Mood normal.        Behavior: Behavior normal.        Thought Content: Thought content normal.        Judgment: Judgment normal.    LABS:   CBC Latest Ref Rng & Units 03/06/2021 03/19/2020 09/03/2016  WBC - 5.2 5.4 7.7  Hemoglobin 12.0 - 16.0 12.7 13.6 13.4  Hematocrit 36 - 46 39 41.0 39.5  Platelets 150 - 399 180 150 178   CMP Latest Ref Rng & Units 03/06/2021 03/19/2020 09/03/2016  Glucose 65 - 99 mg/dL - 111(H) 96  BUN 4 - 21 11 7(L) 14  Creatinine 0.5 - 1.1 1.1 0.90 1.15(H)  Sodium 137 - 147 144 149(H) 145(H)  Potassium 3.4 - 5.3 3.7 3.5 4.0  Chloride 99 - 108 111(A) 108(H) 106  CO2 13 - 22 23(A) 24 23  Calcium 8.7 - 10.7 10.6 10.5(H) 10.6(H)  Total Protein 6.5 - 8.1 g/dL - - -  Total Bilirubin 0.3 - 1.2 mg/dL - - -  Alkaline Phos 25 - 125 124 - -  AST 13 - 35 25 - -  ALT 7 - 35 14 - -     STUDIES:  No results  found.    EXAM: 01/10/2021 CT NECK WITH CONTRAST   TECHNIQUE:  Multidetector CT imaging of the neck was performed using the  standard protocol following the bolus administration of intravenous  contrast.   CONTRAST: 100 mL Isovue 370 IV   COMPARISON: CT head 04/17/2019   FINDINGS:  Pharynx and larynx: Negative for pharyngeal mass or edema.  Image quality degraded by motion. The patient was coughing  throughout the study.  Salivary glands: Cystic mass is present lateral to the right  submandibular  gland. The cyst measures approximately 27 x 25 mm.  Mild amount of nodularity within the cyst. No mass or inflammation  of the right submandibular gland. No stone identified. Left  submandibular gland normal. Parotid normal bilaterally. None  Thyroid: Negative  Lymph nodes: 11 mm cyst anterior to the right jugular vein and  carotid artery below the hyoid. Likely necrotic level 3 lymph node  on the right. Cystic mass lateral to right submandibular gland could  be a cystic lymph node. Otherwise no enlarged or cystic lymph nodes  elsewhere in the neck.  Vascular: Normal vascular enhancement  Limited intracranial: Negative  Visualized orbits: Not imaged  Mastoids and visualized paranasal sinuses: Negative  Skeleton: No acute abnormality.  Upper chest: Left-sided pacemaker. Numerous collateral veins in the  left neck which may be due to central venous stenosis from the  pacemaker. No acute abnormality in the lung apices. Apical scarring  bilaterally.  Other: None   IMPRESSION:  27 x 25 mm cyst lateral to the right submandibular gland. The right  submandibular gland otherwise is normal. Mild amount of soft tissue  nodularity within the cyst. Possible infection or inflammatory cyst.  Metastatic lymph node also possible.  11 mm necrotic right level 3 lymph node. This raises the possibility  of metastatic disease for both lesions.  Correlate with symptoms of infection. Surgical evaluation  recommended if these do not resolve with antibiotic treatment.   CYTOPATHOLOGY: 01/28/2021 Neck mass, right:  Rare atypical cells and keratin  Findings suspicious of squamous proliferative process, including carcinoma  I, Rita Ohara, am acting as scribe for Derwood Kaplan, MD  I have reviewed this report as typed by the medical scribe, and it is complete and accurate.

## 2021-03-06 ENCOUNTER — Other Ambulatory Visit: Payer: Self-pay

## 2021-03-06 ENCOUNTER — Other Ambulatory Visit: Payer: Self-pay | Admitting: Oncology

## 2021-03-06 ENCOUNTER — Encounter: Payer: Self-pay | Admitting: Oncology

## 2021-03-06 ENCOUNTER — Inpatient Hospital Stay: Payer: Medicare Other

## 2021-03-06 ENCOUNTER — Inpatient Hospital Stay: Payer: Medicare Other | Attending: Oncology | Admitting: Oncology

## 2021-03-06 VITALS — BP 151/70 | HR 74 | Temp 97.7°F | Resp 20 | Ht 64.0 in | Wt 162.7 lb

## 2021-03-06 DIAGNOSIS — Z85828 Personal history of other malignant neoplasm of skin: Secondary | ICD-10-CM | POA: Insufficient documentation

## 2021-03-06 DIAGNOSIS — C770 Secondary and unspecified malignant neoplasm of lymph nodes of head, face and neck: Secondary | ICD-10-CM | POA: Diagnosis present

## 2021-03-06 DIAGNOSIS — C801 Malignant (primary) neoplasm, unspecified: Secondary | ICD-10-CM | POA: Diagnosis present

## 2021-03-06 DIAGNOSIS — Z7901 Long term (current) use of anticoagulants: Secondary | ICD-10-CM | POA: Diagnosis not present

## 2021-03-06 DIAGNOSIS — I4891 Unspecified atrial fibrillation: Secondary | ICD-10-CM | POA: Insufficient documentation

## 2021-03-06 DIAGNOSIS — F0390 Unspecified dementia without behavioral disturbance: Secondary | ICD-10-CM | POA: Diagnosis not present

## 2021-03-06 DIAGNOSIS — Z950 Presence of cardiac pacemaker: Secondary | ICD-10-CM | POA: Diagnosis not present

## 2021-03-06 DIAGNOSIS — Z79899 Other long term (current) drug therapy: Secondary | ICD-10-CM | POA: Diagnosis not present

## 2021-03-06 LAB — CBC: RBC: 4.45 (ref 3.87–5.11)

## 2021-03-06 LAB — COMPREHENSIVE METABOLIC PANEL
Albumin: 4.3 (ref 3.5–5.0)
Calcium: 10.6 (ref 8.7–10.7)

## 2021-03-06 LAB — BASIC METABOLIC PANEL
BUN: 11 (ref 4–21)
CO2: 23 — AB (ref 13–22)
Chloride: 111 — AB (ref 99–108)
Creatinine: 1.1 (ref 0.5–1.1)
Glucose: 146
Potassium: 3.7 (ref 3.4–5.3)
Sodium: 144 (ref 137–147)

## 2021-03-06 LAB — CBC AND DIFFERENTIAL
HCT: 39 (ref 36–46)
Hemoglobin: 12.7 (ref 12.0–16.0)
Neutrophils Absolute: 3.12
Platelets: 180 (ref 150–399)
WBC: 5.2

## 2021-03-06 LAB — HEPATIC FUNCTION PANEL
ALT: 14 (ref 7–35)
AST: 25 (ref 13–35)
Alkaline Phosphatase: 124 (ref 25–125)
Bilirubin, Total: 0.8

## 2021-03-06 LAB — PROTIME-INR
INR: 1.2 (ref 0.8–1.2)
Prothrombin Time: 15.5 seconds — ABNORMAL HIGH (ref 11.4–15.2)

## 2021-03-06 LAB — APTT: aPTT: 38 seconds — ABNORMAL HIGH (ref 24–36)

## 2021-03-06 MED ORDER — TRAMADOL HCL 50 MG PO TABS
50.0000 mg | ORAL_TABLET | Freq: Four times a day (QID) | ORAL | 0 refills | Status: AC | PRN
Start: 2021-03-06 — End: ?

## 2021-03-07 ENCOUNTER — Telehealth: Payer: Self-pay | Admitting: Oncology

## 2021-03-07 NOTE — Telephone Encounter (Signed)
Patient has been scheduled for a follow-up and for chemo education per 03/06/21 LOS. Son has been given an appt calendar and notified that if the provider wants to see him sooner than the date scheduled we would give him a call.

## 2021-03-14 ENCOUNTER — Ambulatory Visit (HOSPITAL_COMMUNITY)
Admission: RE | Admit: 2021-03-14 | Discharge: 2021-03-14 | Disposition: A | Discharge status: Home / Self Care | Payer: Medicare Other | Source: Ambulatory Visit | Attending: Oncology | Admitting: Oncology

## 2021-03-14 DIAGNOSIS — C770 Secondary and unspecified malignant neoplasm of lymph nodes of head, face and neck: Secondary | ICD-10-CM | POA: Insufficient documentation

## 2021-03-14 LAB — GLUCOSE, CAPILLARY: Glucose-Capillary: 167 mg/dL — ABNORMAL HIGH (ref 70–99)

## 2021-03-14 MED ORDER — FLUDEOXYGLUCOSE F - 18 (FDG) INJECTION
8.0000 | Freq: Once | INTRAVENOUS | Status: AC
Start: 2021-03-14 — End: 2021-03-14
  Administered 2021-03-14: 8 via INTRAVENOUS

## 2021-03-17 ENCOUNTER — Telehealth: Payer: Self-pay

## 2021-03-17 NOTE — Telephone Encounter (Signed)
I received a call report from [facility] @ [facility]. They wanted to make sure you have seen the PET results. I forwarded the above information to Dr Hinton Rao.

## 2021-03-17 NOTE — Progress Notes (Incomplete)
[name]  [address] [place],  [place]  [address] [phone]  Clinic Day:  03/17/2021  Referring physician: Darrol Jump, PA-C  This document serves as a record of services personally performed by Hosie Poisson, MD. It was created on their behalf by Curry,Lauren E, a trained medical scribe. The creation of this record is based on the scribe's personal observations and the provider's statements to them.  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 We will request the official pathology report.  Malignant appearing mass of the right submandibular area which measures about 7-8 x 6 cm. FNA was suspicious of squamous proliferative process, including carcinoma. This appears to be enlarging fairly quickly and is painful. We discussed treatment options today including immunotherapy +/- radiation therapy. I do not recommend pursuing aggressive chemotherapy, but another option would be cetuximab.  Hypercalcemia, mild. She has discontinued oral calcium supplement.  Dementia, for which she is on medication. She is mildly confused, but is able to live at home and function by herself. She has limited comprehension and memory of this malignant process. I have answered her questions honestly, but I don't know how much she remembers.  Atrial fibrillation and pacemaker, for which she is on Eliquis. This would have to be held for port placement.  Pain of the right jaw secondary to tumor. I advised that they avoid ibuprofen and Advil. She has tramadol 50 mg to use as needed, up to 4 daily, and for breakthrough pain, I advised that they use Tylenol.    PET imaging has revealed asymmetric muscular enlargement in the inferior right masticator space with associated hypermetabolism, contiguous with a complex cystic mass inferiorly. Otherwise, no evidence of hypermetabolic malignancy. In  view of her advanced age and comorbidities, I would not recommend chemotherapy, however, immunotherapy may be considered with LIBTAYO. She will require port placement, which can scheduled through interventional radiology. Radiation therapy has been recommended, and so we will refer her to Dr. Orlene Erm for further evaluation and discussion. I will plan to present her case at [facility] for multidisciplinary discussion. Once we receive the results, we will bring her back to finalize a treatment plan. She and her son understand and agree to this plan of care. I have answered their questions.  I provided *** minutes of face-to-face time during this this encounter and > 50% was spent counseling as documented under my assessment and plan.    Derwood Kaplan, MD [facility] AT [facility] [address] [place] [address] Dept: [phone] Dept Fax: [phone]    CHIEF COMPLAINT:  CC: Metastatic squamous cell carcinoma  Current Treatment:  Diagnostics   HISTORY OF PRESENT ILLNESS:  [name] is a 88 y.o. female referred by Dr. Cletis Athens for the evaluation and treatment of metastatic squamous cell carcinoma. This began when the patient developed a progressively enlarging and painful mass of the right submandibular a few months ago. CT neck from December revealed a 27 x 25 mm cyst lateral to the right submandibular gland. The right submandibular gland otherwise is normal. Mild amount of soft tissue nodularity within the cyst. There was also an 11 mm necrotic right level 3 lymph node. FNA was obtained on December 20th and cytopathology confirmed rare atypical cells and keratin. Findings are suspicious of squamous proliferative process, including carcinoma. Of note, the patient had a history of  squamous cell carcinoma with excision of a right facial lesions in May 2022 by dermatology. She was evaluated by Dr. Gaylyn Cheers who has recommended  chemotherapy and radiation.  She has a medical history significant for atrial fibrillation, congestive heart failure and a pacemaker, and is on anticoagulation with Eliquis. She also has a history of thrombosis, and has one function kidney. She has also required esophageal dilation for stricture in the past. She has hypertension, dementia, and thyroid disease.  INTERVAL HISTORY:  I have reviewed her chart and materials related to her cancer extensively and collaborated history with the patient. Summary of oncologic history is as follows: Oncology History   No history exists.    [name] does have dementia but lives at home alone with her sons nearby. She is able to cook for herself and is on medication for her dementia. Her son accompanies her today to assist in the patient history. This mass of the neck is painful on palpation, and she uses tramadol 50 mg as needed with mild relief. The son has also been giving her ibuprofen and I advised that he stop that due to her anticoagulation. The patient asked if this is cancer and I told her it was, but I think she has limited comprehension and memory retention. Blood counts and chemistries are unremarkable except for a creatinine of 1.1, stable, and a calcium of 10.6. She remains off oral calcium supplement. Her  appetite is good, and she is eating well.  She denies fever, chills or other signs of infection.  She denies nausea, vomiting, bowel issues, or abdominal pain.  She denies sore throat, cough, dyspnea, or chest pain.  Eylin is here for follow up to review recent imaging results. PET imaging from February 3rd revealed asymmetric muscular enlargement in the inferior right masticator space with associated hypermetabolism, contiguous with a complex cystic mass inferiorly. Otherwise, no evidence of hypermetabolic malignancy. Partially loculated moderate left hydropneumothorax. Amount of pleural air is small. Tiny right pleural effusion.   Her  appetite is  good, and she has gained/lost _ pounds since her last visit.  She denies fever, chills or other signs of infection.  She denies nausea, vomiting, bowel issues, or abdominal pain.  She denies sore throat, cough, dyspnea, or chest pain.  HISTORY:   Allergies:  Allergies  Allergen Reactions   Lisinopril     cough    Current Medications: Current Outpatient Medications  Medication Sig Dispense Refill   apixaban (ELIQUIS) 2.5 MG TABS tablet Take 2.5 mg by mouth 2 (two) times daily.     calcium carbonate (OS-CAL) 600 MG TABS tablet Take 600 mg by mouth daily with breakfast.     Cyanocobalamin (VITAMIN B-12 CR) 1500 MCG TBCR Take 1,500 mcg by mouth daily.     diltiazem (TIAZAC) 180 MG 24 hr capsule Take 180 mg by mouth daily.     donepezil (ARICEPT) 10 MG tablet Take 10 mg by mouth daily.  3   lansoprazole (PREVACID) 30 MG capsule Take 30 mg by mouth daily at 12 noon.     levETIRAcetam (KEPPRA) 500 MG tablet Take 500 mg by mouth 2 (two) times daily.  3   levothyroxine (SYNTHROID, LEVOTHROID) 100 MCG tablet Take 100 mcg by mouth daily.  3   traMADol (ULTRAM) 50 MG tablet Take 1 tablet (50 mg total) by mouth every 6 (six) hours as needed. 120 tablet 0   No current facility-administered medications for this visit.    REVIEW OF SYSTEMS:  Review  of Systems  Constitutional: Negative.  Negative for appetite change, chills, fatigue, fever and unexpected weight change.  HENT:          Pain of the jaw  Eyes: Negative.   Respiratory: Negative.  Negative for chest tightness, cough, hemoptysis, shortness of breath and wheezing.   Cardiovascular: Negative.  Negative for chest pain, leg swelling and palpitations.  Gastrointestinal: Negative.  Negative for abdominal distention, abdominal pain, blood in stool, constipation, diarrhea, nausea and vomiting.  Endocrine: Negative.   Genitourinary: Negative.  Negative for difficulty urinating, dysuria, frequency and hematuria.   Musculoskeletal: Negative.   Negative for arthralgias, back pain, flank pain, gait problem and myalgias.  Skin: Negative.   Neurological: Negative.  Negative for dizziness, extremity weakness, gait problem, headaches, light-headedness, numbness, seizures and speech difficulty.  Hematological: Negative.   Psychiatric/Behavioral:  Positive for confusion (mild, and impaired memory). Negative for depression and sleep disturbance. The patient is not nervous/anxious.     VITALS:  There were no vitals taken for this visit.  Wt Readings from Last 3 Encounters:  03/06/21 162 lb 11.2 oz (73.8 kg)  06/25/20 153 lb 12.8 oz (69.8 kg)  03/25/20 149 lb 0.5 oz (67.6 kg)    There is no height or weight on file to calculate BMI.  Performance status (ECOG): 1 - Symptomatic but completely ambulatory  PHYSICAL EXAM:  Physical Exam Constitutional:      General: She is not in acute distress.    Appearance: Normal appearance. She is normal weight.  HENT:     Head: Normocephalic and atraumatic.     Comments: She has a deep scar in the right maxillary area which is well healed. She has a number of moles and keratotic lesions.   Eyes:     General: No scleral icterus.    Extraocular Movements: Extraocular movements intact.     Conjunctiva/sclera: Conjunctivae normal.     Pupils: Pupils are equal, round, and reactive to light.  Neck:     Comments: Mass measuring about 7-8 cm in length and 6 cm across, which is hard and tender, and extends from the right anterior neck to the right submandibular. No nodules are seen. Cardiovascular:     Rate and Rhythm: Normal rate and regular rhythm.     Pulses: Normal pulses.     Heart sounds: Normal heart sounds. No murmur heard.   No friction rub. No gallop.  Pulmonary:     Effort: Pulmonary effort is normal. No respiratory distress.     Breath sounds: Normal breath sounds.  Abdominal:     General: Bowel sounds are normal. There is no distension.     Palpations: Abdomen is soft. There is no  hepatomegaly, splenomegaly or mass.     Tenderness: There is no abdominal tenderness.  Musculoskeletal:        General: Normal range of motion.     Cervical back: Normal range of motion and neck supple.     Right lower leg: No edema.     Left lower leg: No edema.  Lymphadenopathy:     Cervical: No cervical adenopathy.  Skin:    General: Skin is warm and dry.     Comments: Skin is paper thin. Skin turgor is decreased.  Neurological:     General: No focal deficit present.     Mental Status: She is alert.  Psychiatric:        Mood and Affect: Mood normal.        Behavior: Behavior  normal.        Thought Content: Thought content normal.        Judgment: Judgment normal.    LABS:   CBC Latest Ref Rng & Units 03/06/2021 03/19/2020 09/03/2016  WBC - 5.2 5.4 7.7  Hemoglobin 12.0 - 16.0 12.7 13.6 13.4  Hematocrit 36 - 46 39 41.0 39.5  Platelets 150 - 399 180 150 178   CMP Latest Ref Rng & Units 03/06/2021 03/19/2020 09/03/2016  Glucose 65 - 99 mg/dL - 111(H) 96  BUN 4 - 21 11 7(L) 14  Creatinine 0.5 - 1.1 1.1 0.90 1.15(H)  Sodium 137 - 147 144 149(H) 145(H)  Potassium 3.4 - 5.3 3.7 3.5 4.0  Chloride 99 - 108 111(A) 108(H) 106  CO2 13 - 22 23(A) 24 23  Calcium 8.7 - 10.7 10.6 10.5(H) 10.6(H)  Total Protein 6.5 - 8.1 g/dL - - -  Total Bilirubin 0.3 - 1.2 mg/dL - - -  Alkaline Phos 25 - 125 124 - -  AST 13 - 35 25 - -  ALT 7 - 35 14 - -     STUDIES:  NM PET Image Restage (PS) Skull Base to Thigh (F-18 FDG)  Result Date: 03/17/2021 CLINICAL DATA:  Initial treatment strategy for skin cancer. EXAM: NUCLEAR MEDICINE PET SKULL BASE TO THIGH TECHNIQUE: 8.0 mCi F-18 FDG was injected intravenously. Full-ring PET imaging was performed from the skull base to thigh after the radiotracer. CT data was obtained and used for attenuation correction and anatomic localization. Fasting blood glucose: 167 mg/dl COMPARISON:  CT chest 01/05/2015. FINDINGS: Mediastinal blood pool activity: SUV max 3.9 Liver  activity: SUV max NECK: Asymmetric muscular enlargement in the inferior right masticator space with associated hypermetabolism, SUV max 9.9. Findings are contiguous with a complex cystic mass inferiorly, 4.3 x 4.6 cm (4/36). No additional abnormal hypermetabolism. Incidental CT findings: None. CHEST: No hypermetabolic mediastinal, hilar or axillary lymph nodes. No hypermetabolic nodules. Incidental CT findings: Atherosclerotic calcification of the aorta, aortic valve and coronary arteries. Pulmonic trunk and heart are enlarged. No pericardial effusion. Moderate, partially loculated left pleural effusion with left lower lobe collapse/consolidation. Small anterior left pneumothorax. Tiny right pleural effusion. ABDOMEN/PELVIS: No abnormal hypermetabolism in the liver, adrenal glands, spleen or pancreas. No hypermetabolic lymph nodes. Asymmetric diffuse left iliopsoas uptake is presumably physiologic. Incidental CT findings: Liver is unremarkable. Cholecystectomy. Adrenal glands are unremarkable. Scarring in the right kidney. Left kidney is atrophic and partially calcified. Visualized portions of the spleen, pancreas, stomach and bowel are unremarkable. Atherosclerotic calcification of the aorta. SKELETON: No abnormal hypermetabolism. Degenerative uptake in the cervical spine. Incidental CT findings: Degenerative changes in the spine and hips. IMPRESSION: 1. Asymmetric muscular enlargement in the inferior right masticator space with associated hypermetabolism, contiguous with a complex cystic mass inferiorly. Correlation with CT neck with contrast may be helpful in further initial evaluation, as clinically indicated. Otherwise, no evidence of hypermetabolic malignancy. 2. Partially loculated moderate left hydropneumothorax. Amount of pleural air is small. These results will be called to the ordering clinician or representative by the Radiologist Assistant, and communication documented in the PACS or [employer].  3. Tiny right pleural effusion. 4. Aortic atherosclerosis (ICD10-I70.0). Coronary artery calcification. 5. Enlarged pulmonic trunk, indicative of pulmonary arterial hypertension. Electronically Signed   By: Lorin Picket M.D.   On: 03/17/2021 10:47      EXAM: 01/10/2021 CT NECK WITH CONTRAST   TECHNIQUE:  Multidetector CT imaging of the neck was performed using the  standard  protocol following the bolus administration of intravenous  contrast.   CONTRAST: 100 mL Isovue 370 IV   COMPARISON: CT head 04/17/2019   FINDINGS:  Pharynx and larynx: Negative for pharyngeal mass or edema.  Image quality degraded by motion. The patient was coughing  throughout the study.  Salivary glands: Cystic mass is present lateral to the right  submandibular gland. The cyst measures approximately 27 x 25 mm.  Mild amount of nodularity within the cyst. No mass or inflammation  of the right submandibular gland. No stone identified. Left  submandibular gland normal. Parotid normal bilaterally. None  Thyroid: Negative  Lymph nodes: 11 mm cyst anterior to the right jugular vein and  carotid artery below the hyoid. Likely necrotic level 3 lymph node  on the right. Cystic mass lateral to right submandibular gland could  be a cystic lymph node. Otherwise no enlarged or cystic lymph nodes  elsewhere in the neck.  Vascular: Normal vascular enhancement  Limited intracranial: Negative  Visualized orbits: Not imaged  Mastoids and visualized paranasal sinuses: Negative  Skeleton: No acute abnormality.  Upper chest: Left-sided pacemaker. Numerous collateral veins in the  left neck which may be due to central venous stenosis from the  pacemaker. No acute abnormality in the lung apices. Apical scarring  bilaterally.  Other: None   IMPRESSION:  27 x 25 mm cyst lateral to the right submandibular gland. The right  submandibular gland otherwise is normal. Mild amount of soft tissue  nodularity within the cyst.  Possible infection or inflammatory cyst.  Metastatic lymph node also possible.  11 mm necrotic right level 3 lymph node. This raises the possibility  of metastatic disease for both lesions.  Correlate with symptoms of infection. Surgical evaluation  recommended if these do not resolve with antibiotic treatment.   CYTOPATHOLOGY: 01/28/2021 Neck mass, right:  Rare atypical cells and keratin  Findings suspicious of squamous proliferative process, including carcinoma   I, Rita Ohara, am acting as scribe for Derwood Kaplan, MD  I have reviewed this report as typed by the medical scribe, and it is complete and accurate.

## 2021-03-19 DIAGNOSIS — C801 Malignant (primary) neoplasm, unspecified: Secondary | ICD-10-CM

## 2021-03-19 DIAGNOSIS — C770 Secondary and unspecified malignant neoplasm of lymph nodes of head, face and neck: Secondary | ICD-10-CM

## 2021-03-21 ENCOUNTER — Ambulatory Visit: Payer: Medicare Other | Admitting: Oncology

## 2021-03-21 ENCOUNTER — Inpatient Hospital Stay: Payer: Medicare Other | Admitting: Hematology and Oncology

## 2021-03-24 ENCOUNTER — Telehealth: Payer: Self-pay

## 2021-03-24 NOTE — Telephone Encounter (Signed)
RECEIVED CALL FROM MISTI MISE @ [facility] ADVISING THAT PATIENT EXPIRED 04/13/21 @ 9:40 PM.

## 2021-03-29 ENCOUNTER — Encounter: Payer: Self-pay | Admitting: Oncology

## 2021-04-09 DEATH — deceased
